# Patient Record
Sex: Female | Born: 1990 | Race: Black or African American | Hispanic: No | Marital: Single | Smoking: Current every day smoker
Health system: Southern US, Community
[De-identification: ages and names within clinical notes are randomized; demographics above are authoritative.]

## PROBLEM LIST (undated history)

## (undated) ENCOUNTER — Inpatient Hospital Stay (HOSPITAL_COMMUNITY): Payer: Self-pay

## (undated) DIAGNOSIS — R569 Unspecified convulsions: Secondary | ICD-10-CM

## (undated) HISTORY — PX: NO PAST SURGERIES: SHX2092

## (undated) HISTORY — PX: WISDOM TOOTH EXTRACTION: SHX21

---
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
## 2010-02-04 ENCOUNTER — Emergency Department (HOSPITAL_COMMUNITY)
Admission: EM | Admit: 2010-02-04 | Discharge: 2010-02-04 | Payer: Self-pay | Source: Home / Self Care | Admitting: Family Medicine

## 2010-02-06 LAB — CBC
HCT: 37.4 % (ref 36.0–46.0)
Hemoglobin: 11.8 g/dL — ABNORMAL LOW (ref 12.0–15.0)
MCH: 24.7 pg — ABNORMAL LOW (ref 26.0–34.0)
MCHC: 31.6 g/dL (ref 30.0–36.0)
MCV: 78.4 fL (ref 78.0–100.0)
Platelets: 229 10*3/uL (ref 150–400)
RBC: 4.77 MIL/uL (ref 3.87–5.11)
RDW: 14.3 % (ref 11.5–15.5)
WBC: 7 10*3/uL (ref 4.0–10.5)

## 2010-02-06 LAB — POCT URINALYSIS DIPSTICK
Bilirubin Urine: NEGATIVE
Hgb urine dipstick: NEGATIVE
Ketones, ur: NEGATIVE mg/dL
Nitrite: NEGATIVE
Protein, ur: 30 mg/dL — AB
Specific Gravity, Urine: 1.02 (ref 1.005–1.030)
Urine Glucose, Fasting: NEGATIVE mg/dL
Urobilinogen, UA: 2 mg/dL — ABNORMAL HIGH (ref 0.0–1.0)
pH: 8.5 — ABNORMAL HIGH (ref 5.0–8.0)

## 2010-02-06 LAB — POCT I-STAT, CHEM 8
BUN: 8 mg/dL (ref 6–23)
Calcium, Ion: 1.19 mmol/L (ref 1.12–1.32)
Chloride: 104 mEq/L (ref 96–112)
Creatinine, Ser: 1.1 mg/dL (ref 0.4–1.2)
Glucose, Bld: 109 mg/dL — ABNORMAL HIGH (ref 70–99)
HCT: 42 % (ref 36.0–46.0)
Hemoglobin: 14.3 g/dL (ref 12.0–15.0)
Potassium: 3.7 mEq/L (ref 3.5–5.1)
Sodium: 138 mEq/L (ref 135–145)
TCO2: 24 mmol/L (ref 0–100)

## 2010-02-06 LAB — POCT RAPID STREP A (OFFICE): Streptococcus, Group A Screen (Direct): NEGATIVE

## 2010-04-28 LAB — POCT URINALYSIS DIP (DEVICE)
Bilirubin Urine: NEGATIVE
Glucose, UA: NEGATIVE mg/dL
Hgb urine dipstick: NEGATIVE
Nitrite: NEGATIVE
Protein, ur: 100 mg/dL — AB
Specific Gravity, Urine: 1.025 (ref 1.005–1.030)
Urobilinogen, UA: 2 mg/dL — ABNORMAL HIGH (ref 0.0–1.0)
pH: 6.5 (ref 5.0–8.0)

## 2010-04-28 LAB — WET PREP, GENITAL
Trich, Wet Prep: NONE SEEN
WBC, Wet Prep HPF POC: NONE SEEN
Yeast Wet Prep HPF POC: NONE SEEN

## 2010-04-28 LAB — GC/CHLAMYDIA PROBE AMP, GENITAL
Chlamydia, DNA Probe: NEGATIVE
GC Probe Amp, Genital: NEGATIVE

## 2010-04-28 LAB — POCT PREGNANCY, URINE: Preg Test, Ur: NEGATIVE

## 2010-05-09 ENCOUNTER — Emergency Department (HOSPITAL_COMMUNITY)
Admission: EM | Admit: 2010-05-09 | Discharge: 2010-05-09 | Disposition: A | Discharge status: Home / Self Care | Payer: Medicaid Other | Attending: Emergency Medicine | Admitting: Emergency Medicine

## 2010-05-09 ENCOUNTER — Emergency Department (HOSPITAL_COMMUNITY): Payer: Medicaid Other

## 2010-05-09 DIAGNOSIS — N898 Other specified noninflammatory disorders of vagina: Secondary | ICD-10-CM | POA: Insufficient documentation

## 2010-05-09 DIAGNOSIS — R109 Unspecified abdominal pain: Secondary | ICD-10-CM | POA: Insufficient documentation

## 2010-05-09 DIAGNOSIS — F17200 Nicotine dependence, unspecified, uncomplicated: Secondary | ICD-10-CM | POA: Insufficient documentation

## 2010-05-09 LAB — DIFFERENTIAL
Basophils Absolute: 0 10*3/uL (ref 0.0–0.1)
Basophils Relative: 0 % (ref 0–1)
Eosinophils Absolute: 0.1 10*3/uL (ref 0.0–0.7)
Eosinophils Relative: 2 % (ref 0–5)
Lymphocytes Relative: 43 % (ref 12–46)
Lymphs Abs: 1.9 10*3/uL (ref 0.7–4.0)
Monocytes Absolute: 0.4 10*3/uL (ref 0.1–1.0)
Monocytes Relative: 9 % (ref 3–12)
Neutro Abs: 2 10*3/uL (ref 1.7–7.7)
Neutrophils Relative %: 46 % (ref 43–77)

## 2010-05-09 LAB — WET PREP, GENITAL
Clue Cells Wet Prep HPF POC: NONE SEEN
Trich, Wet Prep: NONE SEEN
Yeast Wet Prep HPF POC: NONE SEEN

## 2010-05-09 LAB — COMPREHENSIVE METABOLIC PANEL
ALT: 14 U/L (ref 0–35)
AST: 23 U/L (ref 0–37)
Albumin: 4.1 g/dL (ref 3.5–5.2)
Alkaline Phosphatase: 81 U/L (ref 39–117)
BUN: 6 mg/dL (ref 6–23)
CO2: 24 mEq/L (ref 19–32)
Calcium: 9.3 mg/dL (ref 8.4–10.5)
Chloride: 103 mEq/L (ref 96–112)
Creatinine, Ser: 0.78 mg/dL (ref 0.4–1.2)
GFR calc Af Amer: >60 mL/min (ref >60)
GFR calc non Af Amer: >60 mL/min (ref >60)
Glucose, Bld: 84 mg/dL (ref 70–99)
Potassium: 3.3 mEq/L — ABNORMAL LOW (ref 3.5–5.1)
Sodium: 134 mEq/L — ABNORMAL LOW (ref 135–145)
Total Bilirubin: 0.5 mg/dL (ref 0.3–1.2)
Total Protein: 7.8 g/dL (ref 6.0–8.3)

## 2010-05-09 LAB — CBC
HCT: 38.9 % (ref 36.0–46.0)
Hemoglobin: 12.7 g/dL (ref 12.0–15.0)
MCH: 25.8 pg — ABNORMAL LOW (ref 26.0–34.0)
MCHC: 32.6 g/dL (ref 30.0–36.0)
MCV: 78.9 fL (ref 78.0–100.0)
Platelets: 225 10*3/uL (ref 150–400)
RBC: 4.93 MIL/uL (ref 3.87–5.11)
RDW: 14.1 % (ref 11.5–15.5)
WBC: 4.5 10*3/uL (ref 4.0–10.5)

## 2010-05-09 LAB — URINALYSIS, ROUTINE W REFLEX MICROSCOPIC
Bilirubin Urine: NEGATIVE
Glucose, UA: NEGATIVE mg/dL
Hgb urine dipstick: NEGATIVE
Ketones, ur: NEGATIVE mg/dL
Leukocytes, UA: NEGATIVE
Nitrite: NEGATIVE
Protein, ur: 100 mg/dL — AB
Specific Gravity, Urine: 1.023 (ref 1.005–1.030)
Urobilinogen, UA: 0.2 mg/dL (ref 0.0–1.0)
pH: 8 (ref 5.0–8.0)

## 2010-05-09 LAB — LIPASE, BLOOD: Lipase: 30 U/L (ref 11–59)

## 2010-05-09 LAB — POCT PREGNANCY, URINE: Preg Test, Ur: NEGATIVE

## 2010-05-09 LAB — URINE MICROSCOPIC-ADD ON

## 2010-05-10 LAB — GC/CHLAMYDIA PROBE AMP, GENITAL
Chlamydia, DNA Probe: NEGATIVE
GC Probe Amp, Genital: NEGATIVE

## 2010-07-14 ENCOUNTER — Emergency Department (HOSPITAL_COMMUNITY)
Admission: EM | Admit: 2010-07-14 | Discharge: 2010-07-14 | Disposition: A | Discharge status: Home / Self Care | Payer: Medicaid Other | Attending: Emergency Medicine | Admitting: Emergency Medicine

## 2010-07-14 DIAGNOSIS — R000 Tachycardia, unspecified: Secondary | ICD-10-CM | POA: Insufficient documentation

## 2010-07-14 DIAGNOSIS — N898 Other specified noninflammatory disorders of vagina: Secondary | ICD-10-CM | POA: Insufficient documentation

## 2010-07-14 DIAGNOSIS — M549 Dorsalgia, unspecified: Secondary | ICD-10-CM | POA: Insufficient documentation

## 2010-07-14 DIAGNOSIS — R109 Unspecified abdominal pain: Secondary | ICD-10-CM | POA: Insufficient documentation

## 2010-07-14 LAB — POCT PREGNANCY, URINE: Preg Test, Ur: NEGATIVE

## 2010-07-22 ENCOUNTER — Emergency Department (HOSPITAL_COMMUNITY)
Admission: EM | Admit: 2010-07-22 | Discharge: 2010-07-22 | Disposition: A | Discharge status: Home / Self Care | Payer: Medicaid Other | Attending: Emergency Medicine | Admitting: Emergency Medicine

## 2010-07-22 ENCOUNTER — Emergency Department (HOSPITAL_COMMUNITY): Payer: Medicaid Other

## 2010-07-22 DIAGNOSIS — E876 Hypokalemia: Secondary | ICD-10-CM | POA: Insufficient documentation

## 2010-07-22 DIAGNOSIS — R1011 Right upper quadrant pain: Secondary | ICD-10-CM | POA: Insufficient documentation

## 2010-07-22 DIAGNOSIS — R1110 Vomiting, unspecified: Secondary | ICD-10-CM | POA: Insufficient documentation

## 2010-07-22 DIAGNOSIS — D649 Anemia, unspecified: Secondary | ICD-10-CM | POA: Insufficient documentation

## 2010-07-22 DIAGNOSIS — G40909 Epilepsy, unspecified, not intractable, without status epilepticus: Secondary | ICD-10-CM | POA: Insufficient documentation

## 2010-07-22 LAB — DIFFERENTIAL
Basophils Absolute: 0 10*3/uL (ref 0.0–0.1)
Basophils Relative: 0 % (ref 0–1)
Eosinophils Absolute: 0.2 10*3/uL (ref 0.0–0.7)
Eosinophils Relative: 4 % (ref 0–5)
Lymphocytes Relative: 22 % (ref 12–46)
Lymphs Abs: 1.1 10*3/uL (ref 0.7–4.0)
Monocytes Absolute: 0.6 10*3/uL (ref 0.1–1.0)
Monocytes Relative: 12 % (ref 3–12)
Neutro Abs: 3.2 10*3/uL (ref 1.7–7.7)
Neutrophils Relative %: 63 % (ref 43–77)

## 2010-07-22 LAB — CBC
HCT: 30.6 % — ABNORMAL LOW (ref 36.0–46.0)
Hemoglobin: 9.6 g/dL — ABNORMAL LOW (ref 12.0–15.0)
MCH: 24.2 pg — ABNORMAL LOW (ref 26.0–34.0)
MCHC: 31.4 g/dL (ref 30.0–36.0)
MCV: 77.3 fL — ABNORMAL LOW (ref 78.0–100.0)
Platelets: 478 10*3/uL — ABNORMAL HIGH (ref 150–400)
RBC: 3.96 MIL/uL (ref 3.87–5.11)
RDW: 13.4 % (ref 11.5–15.5)
WBC: 5.1 10*3/uL (ref 4.0–10.5)

## 2010-07-22 LAB — COMPREHENSIVE METABOLIC PANEL
ALT: 8 U/L (ref 0–35)
AST: 16 U/L (ref 0–37)
Albumin: 3.6 g/dL (ref 3.5–5.2)
Alkaline Phosphatase: 94 U/L (ref 39–117)
BUN: 14 mg/dL (ref 6–23)
CO2: 28 mEq/L (ref 19–32)
Calcium: 9.8 mg/dL (ref 8.4–10.5)
Chloride: 99 mEq/L (ref 96–112)
Creatinine, Ser: 0.78 mg/dL (ref 0.50–1.10)
GFR calc Af Amer: >60 mL/min (ref >60)
GFR calc non Af Amer: >60 mL/min (ref >60)
Glucose, Bld: 85 mg/dL (ref 70–99)
Potassium: 3.4 mEq/L — ABNORMAL LOW (ref 3.5–5.1)
Sodium: 138 mEq/L (ref 135–145)
Total Bilirubin: 0.2 mg/dL — ABNORMAL LOW (ref 0.3–1.2)
Total Protein: 9.1 g/dL — ABNORMAL HIGH (ref 6.0–8.3)

## 2010-07-22 LAB — POCT PREGNANCY, URINE: Preg Test, Ur: NEGATIVE

## 2010-07-22 LAB — URINALYSIS, ROUTINE W REFLEX MICROSCOPIC
Ketones, ur: 15 mg/dL — AB
Leukocytes, UA: NEGATIVE
Nitrite: NEGATIVE
Protein, ur: 100 mg/dL — AB
Urobilinogen, UA: 1 mg/dL (ref 0.0–1.0)
pH: 6 (ref 5.0–8.0)

## 2010-07-22 LAB — LIPASE, BLOOD: Lipase: 20 U/L (ref 11–59)

## 2010-07-22 LAB — URINE MICROSCOPIC-ADD ON

## 2010-07-24 LAB — OCCULT BLOOD, POC DEVICE: Fecal Occult Bld: NEGATIVE

## 2010-10-14 ENCOUNTER — Emergency Department (HOSPITAL_COMMUNITY)
Admission: EM | Admit: 2010-10-14 | Discharge: 2010-10-14 | Disposition: A | Discharge status: Home / Self Care | Payer: Medicaid Other | Attending: Emergency Medicine | Admitting: Emergency Medicine

## 2010-10-14 DIAGNOSIS — N390 Urinary tract infection, site not specified: Secondary | ICD-10-CM | POA: Insufficient documentation

## 2010-10-14 DIAGNOSIS — F17200 Nicotine dependence, unspecified, uncomplicated: Secondary | ICD-10-CM | POA: Insufficient documentation

## 2010-10-14 DIAGNOSIS — N760 Acute vaginitis: Secondary | ICD-10-CM | POA: Insufficient documentation

## 2010-10-14 LAB — WET PREP, GENITAL
Trich, Wet Prep: NONE SEEN
Yeast Wet Prep HPF POC: NONE SEEN

## 2010-10-14 LAB — URINE MICROSCOPIC-ADD ON

## 2010-10-14 LAB — URINALYSIS, ROUTINE W REFLEX MICROSCOPIC
Bilirubin Urine: NEGATIVE
Hgb urine dipstick: NEGATIVE
Specific Gravity, Urine: 1.024 (ref 1.005–1.030)
Urobilinogen, UA: 1 mg/dL (ref 0.0–1.0)
pH: 8.5 — ABNORMAL HIGH (ref 5.0–8.0)

## 2010-10-14 LAB — POCT PREGNANCY, URINE: Preg Test, Ur: NEGATIVE

## 2010-10-17 LAB — GC/CHLAMYDIA PROBE AMP, GENITAL
Chlamydia, DNA Probe: POSITIVE — AB
GC Probe Amp, Genital: NEGATIVE

## 2011-02-10 ENCOUNTER — Emergency Department (HOSPITAL_COMMUNITY): Payer: Medicaid Other

## 2011-02-10 ENCOUNTER — Emergency Department (HOSPITAL_COMMUNITY)
Admission: EM | Admit: 2011-02-10 | Discharge: 2011-02-10 | Disposition: A | Discharge status: Home / Self Care | Payer: Medicaid Other | Attending: Emergency Medicine | Admitting: Emergency Medicine

## 2011-02-10 ENCOUNTER — Encounter (HOSPITAL_COMMUNITY): Payer: Self-pay | Admitting: *Deleted

## 2011-02-10 DIAGNOSIS — H5789 Other specified disorders of eye and adnexa: Secondary | ICD-10-CM | POA: Insufficient documentation

## 2011-02-10 DIAGNOSIS — H11429 Conjunctival edema, unspecified eye: Secondary | ICD-10-CM | POA: Insufficient documentation

## 2011-02-10 DIAGNOSIS — H5710 Ocular pain, unspecified eye: Secondary | ICD-10-CM | POA: Insufficient documentation

## 2011-02-10 DIAGNOSIS — H11421 Conjunctival edema, right eye: Secondary | ICD-10-CM

## 2011-02-10 DIAGNOSIS — T368X5A Adverse effect of other systemic antibiotics, initial encounter: Secondary | ICD-10-CM | POA: Insufficient documentation

## 2011-02-10 DIAGNOSIS — H538 Other visual disturbances: Secondary | ICD-10-CM | POA: Insufficient documentation

## 2011-02-10 DIAGNOSIS — J45909 Unspecified asthma, uncomplicated: Secondary | ICD-10-CM | POA: Insufficient documentation

## 2011-02-10 DIAGNOSIS — H547 Unspecified visual loss: Secondary | ICD-10-CM | POA: Insufficient documentation

## 2011-02-10 DIAGNOSIS — H11419 Vascular abnormalities of conjunctiva, unspecified eye: Secondary | ICD-10-CM | POA: Insufficient documentation

## 2011-02-10 DIAGNOSIS — T7840XA Allergy, unspecified, initial encounter: Secondary | ICD-10-CM

## 2011-02-10 LAB — DIFFERENTIAL
Band Neutrophils: 0 % (ref 0–10)
Basophils Absolute: 0 10*3/uL (ref 0.0–0.1)
Basophils Relative: 0 % (ref 0–1)
Lymphocytes Relative: 65 % — ABNORMAL HIGH (ref 12–46)
Lymphs Abs: 2.5 10*3/uL (ref 0.7–4.0)
Monocytes Absolute: 0.4 10*3/uL (ref 0.1–1.0)
Monocytes Relative: 11 % (ref 3–12)
Neutro Abs: 0.8 10*3/uL — ABNORMAL LOW (ref 1.7–7.7)
Neutrophils Relative %: 22 % — ABNORMAL LOW (ref 43–77)

## 2011-02-10 LAB — CBC
HCT: 36.9 % (ref 36.0–46.0)
Hemoglobin: 11.9 g/dL — ABNORMAL LOW (ref 12.0–15.0)
RBC: 4.65 MIL/uL (ref 3.87–5.11)
WBC: 3.8 10*3/uL — ABNORMAL LOW (ref 4.0–10.5)

## 2011-02-10 LAB — POCT I-STAT, CHEM 8
BUN: 4 mg/dL — ABNORMAL LOW (ref 6–23)
Calcium, Ion: 1.29 mmol/L (ref 1.12–1.32)
Chloride: 104 mEq/L (ref 96–112)
Glucose, Bld: 80 mg/dL (ref 70–99)
HCT: 40 % (ref 36.0–46.0)
TCO2: 26 mmol/L (ref 0–100)

## 2011-02-10 MED ORDER — TETRACAINE HCL 0.5 % OP SOLN: 1.0000 [drp] | Freq: Once | OPHTHALMIC | Status: DC

## 2011-02-10 MED ORDER — FAMOTIDINE 20 MG PO TABS
20.0000 mg | ORAL_TABLET | Freq: Once | ORAL | Status: AC
  Administered 2011-02-10: 20 mg via ORAL
  Filled 2011-02-10: qty 1

## 2011-02-10 MED ORDER — SULFACETAMIDE-PREDNISOLONE 10-0.23 % OP SOLN: 2.0000 [drp] | Freq: Four times a day (QID) | OPHTHALMIC | Status: AC

## 2011-02-10 MED ORDER — SULFACETAMIDE-PREDNISOLONE 10-0.23 % OP SOLN: 2.0000 [drp] | Freq: Four times a day (QID) | OPHTHALMIC | Status: DC

## 2011-02-10 MED ORDER — ERYTHROMYCIN 5 MG/GM OP OINT: TOPICAL_OINTMENT | Freq: Every day | OPHTHALMIC | Status: AC

## 2011-02-10 MED ORDER — FLUORESCEIN SODIUM 1 MG OP STRP: 1.0000 | ORAL_STRIP | Freq: Once | OPHTHALMIC | Status: DC

## 2011-02-10 NOTE — ED Notes (Signed)
Called Pharmacy to see if the Vasocidin was available, and they stated it was not. Informed EDP Tomasa Blase. Will continue to monitor.

## 2011-02-10 NOTE — ED Notes (Signed)
Lab sending pts blood now. CT stated they will be getting pt in a few minutes. Notified pt about plans. Will continue to monitor.

## 2011-02-10 NOTE — ED Notes (Signed)
Eye drops and strip at bedside. EDP made aware.

## 2011-02-10 NOTE — ED Notes (Signed)
Pt stated that she has had "pink eye" since Thursday. She went to the ED in [place] and they gave her medicated eye drops. Per pt, she has had increased drainage, itching, and redness. Currently, not in any pain or respiratory distress. Will continue to monitor.

## 2011-02-10 NOTE — ED Provider Notes (Signed)
History     CSN: [id number]  Arrival date & time 02/10/11  1755   First MD Initiated Contact with Patient 02/10/11 2020      Chief Complaint  Patient presents with  . Eye Problem    (Consider location/radiation/quality/duration/timing/severity/associated sxs/prior treatment) HPI Comments: Patient initially noticed some crusting to the left inner canthus for 5 days ago.  This spontaneously resolved.  4 days ago.  She noticed the same in her right eye, but has progressively gotten red, painful.  She was seen at [facility] and diagnosed with conjunctivitis given gentamicin eyedrops, which she has been using as prescribed.  The redness has become worse.  Drainage has increased.  She now has.  Orbital edema and tenderness and a paleness to her right cheek.  She denies headache, nausea, vomiting, has slight pain with lateral and upward gaze of the eye.  She does report extreme blurriness in the right eye, she is supposed to wear glasses, but has lost them and does not have her current ophthalmologist.  Denies recent URI symptoms, nasal congestion, sinusitis  Patient is a 21 y.o. female presenting with eye problem. The history is provided by the patient.  Eye Problem  This is a new problem. The current episode started more than 2 days ago. The problem occurs constantly. The problem has been gradually worsening. There is pain in the right eye. There was no injury mechanism. The pain is at a severity of 7/10. The pain is moderate. There is no history of trauma to the eye. Associated symptoms include decreased vision, discharge and eye redness. Pertinent negatives include no nausea.    Past Medical History  Diagnosis Date  . Asthma     History reviewed. No pertinent past surgical history.  History reviewed. No pertinent family history.  History  Substance Use Topics  . Smoking status: Current Everyday Smoker  . Smokeless tobacco: Not on file  . Alcohol Use: No    OB  History    Grav Para Term Preterm Abortions TAB SAB Ect Mult Living                  Review of Systems  Constitutional: Negative for fever and chills.  HENT: Negative for rhinorrhea and sinus pressure.   Eyes: Positive for discharge and redness.  Gastrointestinal: Negative for nausea.  Musculoskeletal: Negative for myalgias.  Skin: Positive for color change.  Neurological: Negative for dizziness and headaches.    Allergies  Review of patient's allergies indicates no known allergies.  Home Medications   Current Outpatient Rx  Name Route Sig Dispense Refill  . ERYTHROMYCIN 5 MG/GM OP OINT Right Eye Place into the right eye at bedtime. 3.5 g 0  . SULFACETAMIDE-PREDNISOLONE 10-0.23 % OP SOLN Right Eye Place 2 drops into the right eye QID. 5 mL 0    BP 105/69  Pulse 84  Temp(Src) 98.2 F (36.8 C) (Oral)  Resp 16  SpO2 100%  LMP 01/10/2011  Physical Exam  Constitutional: She is oriented to person, place, and time. She appears well-developed and well-nourished.  HENT:  Head: Normocephalic.         Area of paleness  Eyes: Right eye exhibits discharge and exudate. Right conjunctiva is injected. Right eye exhibits abnormal extraocular motion. Right eye exhibits no nystagmus. Right pupil is round and reactive.         There is no fluorescein stain uptake tender around the eye, discoloration around the eye palenees of cheek  Neck: Normal range of motion.  Cardiovascular: Normal rate.   Pulmonary/Chest: Effort normal.  Musculoskeletal: Normal range of motion.  Neurological: She is alert and oriented to person, place, and time.  Skin: Skin is warm and dry. There is erythema.       As described around the eye     ED Course  Procedures (including critical care time)  Labs Reviewed  CBC - Abnormal; Notable for the following:    WBC 3.8 (*)    Hemoglobin 11.9 (*)    MCH 25.6 (*)    All other components within normal limits  DIFFERENTIAL - Abnormal; Notable for the  following:    Neutrophils Relative 22 (*)    Lymphocytes Relative 65 (*)    Neutro Abs 0.8 (*)    All other components within normal limits  POCT I-STAT, CHEM 8 - Abnormal; Notable for the following:    Potassium 3.1 (*)    BUN 4 (*)    All other components within normal limits  I-STAT, CHEM 8   Ct Orbitss W/o Cm  02/10/2011  *RADIOLOGY REPORT*  Clinical Data: Periorbital cellulitis  CT ORBITS WITHOUT CONTRAST  Technique:  Multidetector CT imaging of the orbits was performed following the standard protocol without intravenous contrast.  Comparison: None.  Findings: Extensive sinusitis.  Marked circumferential mucosal thickening of both maxillary sinuses.  Extensive mucosal thickening and fluid in the ethmoid air cells bilaterally.  Near complete opacification of the left sphenoid sinus and focal mucosal thickening of the right sphenoid sinus.  The frontal sinuses are clear.  Imaged portions of the mastoid air cells are clear.  The globes and lenses appear normal bilaterally.  The retro-orbital fat planes are normal and symmetric.  No significant facial swelling is appreciated.  No acute bony abnormality.  IMPRESSION:  1.  Extensive chronic sinusitis involving the bilateral maxillary, ethmoid, and sphenoid sinuses. An element of superimposed acute sinusitis is difficult to exclude. 2.  Normal appearance of the orbits.  Original Report Authenticated By: Britta Mccreedy, M.D.     1. Allergic reaction caused by a drug   2. Conjunctival edema of right eye     Concern for worsening redness, decreased vision.  Will obtain labs, CT scan to rule out periorbital cellulitis, abscess.  There is no fluorescein stain uptake of the eye to indicate abrasion or ulcer.  After this is done will contact ophthalmology Discussed Ms. Noblett's case with Dr. Karleen Hampshire and we agree that this is an allergic reaction to the gentamicin.  We'll switch patient to assess.  I again drops 4 times a day cold compresses with  erythromyc for the next 7 days ointment at night .  She can followup with Dr. Karleen Hampshire next week if not improving Allergic reaction to the gentamicin eyedropsMDM          Arman Filter, NP 02/10/11 2310

## 2011-02-10 NOTE — ED Notes (Signed)
The pt has had rt eye irritation since  Thursday.  She initially had both eyes red and itching now only the rt eye is red.  She was seen at [place] [facility] thursday

## 2011-02-21 NOTE — ED Provider Notes (Signed)
Evaluation and management procedures were performed by the PA/NP under my supervision/collaboration.    Zoey Gilkeson D Megann Easterwood, MD 02/21/11 1909 

## 2011-02-25 ENCOUNTER — Emergency Department (INDEPENDENT_AMBULATORY_CARE_PROVIDER_SITE_OTHER)
Admission: EM | Admit: 2011-02-25 | Discharge: 2011-02-25 | Disposition: A | Discharge status: Home / Self Care | Payer: Medicaid Other | Source: Home / Self Care

## 2011-02-25 ENCOUNTER — Emergency Department (INDEPENDENT_AMBULATORY_CARE_PROVIDER_SITE_OTHER): Payer: Medicaid Other

## 2011-02-25 ENCOUNTER — Encounter (HOSPITAL_COMMUNITY): Payer: Self-pay

## 2011-02-25 DIAGNOSIS — R109 Unspecified abdominal pain: Secondary | ICD-10-CM

## 2011-02-25 LAB — POCT URINALYSIS DIP (DEVICE)
Glucose, UA: 100 mg/dL — AB
Hgb urine dipstick: NEGATIVE
Nitrite: NEGATIVE
Protein, ur: 100 mg/dL — AB
Specific Gravity, Urine: >=1.03 (ref 1.005–1.030)
Urobilinogen, UA: 1 mg/dL (ref 0.0–1.0)

## 2011-02-25 LAB — WET PREP, GENITAL: Yeast Wet Prep HPF POC: NONE SEEN

## 2011-02-25 NOTE — ED Notes (Signed)
Pt has abd pain and wants to have a pregnancy test.

## 2011-02-25 NOTE — ED Provider Notes (Signed)
History     CSN: [id number]  Arrival date & time 02/25/11  1241   None     Chief Complaint  Patient presents with  . Abdominal Pain    (Consider location/radiation/quality/duration/timing/severity/associated sxs/prior treatment) HPI Comments: Pt presents requesting to have a pregnancy test. She states she did a home pregnancy test and thinks that it is positive. Her LMP was 02-22-11 but she is concerned that she may be pregnant because she is having intermittent pain "in my sides" for the last 3 weeks. She points to bilat flank areas. She states that the pain comes and goes during the day, varies from side to side, and is sharp. It last for a few minutes only when occurs and does not radiate. She has had no fever, N/V/D/C. BMs have been every other day, and this is normal for her. She denies dysuria, urinary frequency or vaginal discharge. She slipped and fell 3 weeks ago and thinks that the pain may be from this.     Past Medical History  Diagnosis Date  . Asthma     History reviewed. No pertinent past surgical history.  History reviewed. No pertinent family history.  History  Substance Use Topics  . Smoking status: Current Everyday Smoker  . Smokeless tobacco: Not on file  . Alcohol Use: No    OB History    Grav Para Term Preterm Abortions TAB SAB Ect Mult Living                  Review of Systems  Constitutional: Negative for fever and chills.  Respiratory: Negative for cough and shortness of breath.   Cardiovascular: Negative for chest pain.  Gastrointestinal: Negative for nausea, vomiting, diarrhea and constipation.  Genitourinary: Positive for flank pain. Negative for dysuria, frequency, decreased urine volume, vaginal bleeding, vaginal discharge, vaginal pain, menstrual problem and pelvic pain.  Musculoskeletal: Negative for back pain.  Skin: Negative for color change and rash.    Allergies  Review of patient's allergies indicates no known allergies.  Home  Medications  No current outpatient prescriptions on file.  BP 112/59  Pulse 79  Temp(Src) 98 F (36.7 C) (Oral)  Resp 14  SpO2 100%  LMP 02/13/2011  Physical Exam  Nursing note and vitals reviewed. Constitutional: She appears well-developed and well-nourished. No distress.  HENT:  Head: Normocephalic and atraumatic.  Cardiovascular: Normal rate, regular rhythm and normal heart sounds.   Pulmonary/Chest: Effort normal and breath sounds normal. No respiratory distress.  Abdominal: Soft. Bowel sounds are normal. She exhibits no mass. There is no hepatosplenomegaly. There is no tenderness. There is no CVA tenderness.  Musculoskeletal:       Thoracic back: She exhibits no tenderness.       Lumbar back: She exhibits no tenderness.  Neurological: Gait normal.  Skin: Skin is warm and dry.  Psychiatric: She has a normal mood and affect.    ED Course  Procedures (including critical care time)  Labs Reviewed  POCT URINALYSIS DIP (DEVICE) - Abnormal; Notable for the following:    Glucose, UA 100 (*)    Bilirubin Urine SMALL (*)    Protein, ur 100 (*)    Leukocytes, UA TRACE (*) Biochemical Testing Only. Please order routine urinalysis from main lab if confirmatory testing is needed.   All other components within normal limits  WET PREP, GENITAL - Abnormal; Notable for the following:    Clue Cells Wet Prep HPF POC FEW (*)    WBC, Wet Prep HPF  POC MODERATE (*)    All other components within normal limits  POCT PREGNANCY, URINE  GC/CHLAMYDIA PROBE AMP, GENITAL   Dg Abd 1 View  02/25/2011  *RADIOLOGY REPORT*  Clinical Data: Right flank pain for 3 weeks.  ABDOMEN - 1 VIEW  Comparison: None.  Findings: Nonobstructive bowel gas pattern.  Prominent proximal stomach contents are present, suggesting recent meal.  No calcifications over the renal shadows or the course of the ureters bilaterally.  The bones appear within normal limits.  IMPRESSION: No acute abnormality.  Original Report  Authenticated By: Andreas Newport, M.D.     1. Flank pain       MDM  Urine preg neg. Pts preg test that she brought from home is also neg. KUB reviewed by myself and radiologist. Chart reviewed. 05-09-10 pt was seen in ED for similar complaints.         Melody Comas, [place] 02/25/11 1526

## 2011-02-27 LAB — GC/CHLAMYDIA PROBE AMP, GENITAL
Chlamydia, DNA Probe: POSITIVE — AB
GC Probe Amp, Genital: POSITIVE — AB

## 2011-03-01 ENCOUNTER — Telehealth (HOSPITAL_COMMUNITY): Payer: Self-pay | Admitting: *Deleted

## 2011-03-01 MED ORDER — AZITHROMYCIN 250 MG PO TABS: 1000.0000 mg | ORAL_TABLET | Freq: Every day | ORAL | Status: DC

## 2011-03-01 MED ORDER — CEFTRIAXONE SODIUM 250 MG IJ SOLR: 250.0000 mg | Freq: Once | INTRAMUSCULAR | Status: DC

## 2011-03-01 NOTE — ED Notes (Signed)
GC pos., Chlamydia pos., Wet prep: Few clue cells, mod. WBC's.  Order obtained from [facility] for Rocephin 250 mg. IM and Zithromax. I called and left message for pt. to call. Vassie Moselle 03/01/2011

## 2011-03-02 ENCOUNTER — Telehealth (HOSPITAL_COMMUNITY): Payer: Self-pay | Admitting: *Deleted

## 2011-03-02 NOTE — ED Notes (Signed)
I called and left message for pt. to call.  Call 2. [name] 03/02/2011

## 2011-03-02 NOTE — ED Notes (Signed)
Pt. left message on VM. I called pt. back and could not get her on the phone. I had to leave another message to call [phone] if she can't get me by 1800.[name] 03/02/2011

## 2011-03-05 ENCOUNTER — Telehealth (HOSPITAL_COMMUNITY): Payer: Self-pay | Admitting: *Deleted

## 2011-03-05 NOTE — ED Notes (Signed)
Pt. verified x 2 and given lab results. Pt. told she needs to come back for a shot of Rocephin and 4 pills of Zithromax. Pt. said she was coming back tonight. I told her to let registration know she was just here for a shot and medication. Pt. instructed to notify her partner, no sex for 1 week and to practice safe sex. Pt. voiced understanding. [name] 03/05/2011

## 2011-03-05 NOTE — ED Provider Notes (Signed)
Medical screening examination/treatment/procedure(s) were performed by non-physician practitioner and as supervising physician I was immediately available for consultation/collaboration.  Corrie Mckusick, MD 03/05/11 [phone]

## 2011-03-06 ENCOUNTER — Emergency Department (INDEPENDENT_AMBULATORY_CARE_PROVIDER_SITE_OTHER)
Admission: EM | Admit: 2011-03-06 | Discharge: 2011-03-06 | Disposition: A | Discharge status: Home / Self Care | Payer: Medicaid Other | Source: Home / Self Care

## 2011-03-06 ENCOUNTER — Encounter (HOSPITAL_COMMUNITY): Payer: Self-pay | Admitting: *Deleted

## 2011-03-06 DIAGNOSIS — Z202 Contact with and (suspected) exposure to infections with a predominantly sexual mode of transmission: Secondary | ICD-10-CM

## 2011-03-06 MED ORDER — CEFTRIAXONE SODIUM 250 MG IJ SOLR
250.0000 mg | Freq: Once | INTRAMUSCULAR | Status: AC
  Administered 2011-03-06: 250 mg via INTRAMUSCULAR

## 2011-03-06 MED ORDER — AZITHROMYCIN 250 MG PO TABS
1000.0000 mg | ORAL_TABLET | Freq: Once | ORAL | Status: AC
  Administered 2011-03-06: 1000 mg via ORAL

## 2011-03-06 MED ORDER — CEFTRIAXONE SODIUM 250 MG IJ SOLR
INTRAMUSCULAR | Status: AC
  Filled 2011-03-06: qty 250

## 2011-03-06 MED ORDER — LIDOCAINE HCL (PF) 1 % IJ SOLN
INTRAMUSCULAR | Status: AC
  Filled 2011-03-06: qty 5

## 2011-03-06 MED ORDER — AZITHROMYCIN 250 MG PO TABS
ORAL_TABLET | ORAL | Status: AC
  Filled 2011-03-06: qty 4

## 2011-03-06 NOTE — ED Notes (Signed)
Pt. came to [facility] and was treated with Rocephin 250 mg. IM and Zithromax 1 gm. Pt. instructed to notify her partner, no sex for 1 week, and to practice safe sex. Pt.told she can get HIV testing at the [facility].  [facility] forms x 2 faxed to [facility]. Vassie Moselle 03/06/2011

## 2011-03-06 NOTE — ED Notes (Signed)
Pt. Called to return for treatment of GC and Chlamydia with Rocephin and Zithromax.

## 2011-03-06 NOTE — Discharge Instructions (Signed)
Call if any problems.  Notify your partner to be treated, no sex for 1 week and practice safe sex. You can get HIV testing at the [facility].

## 2011-03-06 NOTE — ED Notes (Signed)
Pt. Instructed she has to wait 15 min. after injection to be observed for allergic reaction. Pt. told what symptoms to watch for- rash like hives with itching, swelling in throat and SOB.

## 2011-05-20 ENCOUNTER — Emergency Department (HOSPITAL_COMMUNITY)
Admission: EM | Admit: 2011-05-20 | Discharge: 2011-05-21 | Disposition: A | Discharge status: Home / Self Care | Payer: Medicaid Other | Attending: Emergency Medicine | Admitting: Emergency Medicine

## 2011-05-20 ENCOUNTER — Encounter (HOSPITAL_COMMUNITY): Payer: Self-pay | Admitting: Emergency Medicine

## 2011-05-20 DIAGNOSIS — B373 Candidiasis of vulva and vagina: Secondary | ICD-10-CM

## 2011-05-20 DIAGNOSIS — N72 Inflammatory disease of cervix uteri: Secondary | ICD-10-CM | POA: Insufficient documentation

## 2011-05-20 DIAGNOSIS — B3731 Acute candidiasis of vulva and vagina: Secondary | ICD-10-CM | POA: Insufficient documentation

## 2011-05-20 HISTORY — DX: Unspecified convulsions: R56.9

## 2011-05-20 LAB — POCT PREGNANCY, URINE: Preg Test, Ur: NEGATIVE

## 2011-05-20 LAB — URINALYSIS, ROUTINE W REFLEX MICROSCOPIC
Nitrite: NEGATIVE
Specific Gravity, Urine: 1.021 (ref 1.005–1.030)
Urobilinogen, UA: 0.2 mg/dL (ref 0.0–1.0)
pH: 7.5 (ref 5.0–8.0)

## 2011-05-20 LAB — URINE MICROSCOPIC-ADD ON

## 2011-05-20 NOTE — ED Notes (Addendum)
Patient states she has vaginal discharge, grey in color, some bleeding with it.  Patient states it started tonight, about 10 mins ago.  Patient states she could possibly be pregnant.

## 2011-05-21 LAB — WET PREP, GENITAL: Trich, Wet Prep: NONE SEEN

## 2011-05-21 MED ORDER — CEFTRIAXONE SODIUM 250 MG IJ SOLR
250.0000 mg | Freq: Once | INTRAMUSCULAR | Status: AC
  Administered 2011-05-21: 250 mg via INTRAMUSCULAR
  Filled 2011-05-21: qty 250

## 2011-05-21 MED ORDER — FLUCONAZOLE 50 MG PO TABS
150.0000 mg | ORAL_TABLET | Freq: Once | ORAL | Status: AC
  Administered 2011-05-21: 150 mg via ORAL
  Filled 2011-05-21: qty 3

## 2011-05-21 MED ORDER — LIDOCAINE HCL (PF) 1 % IJ SOLN
INTRAMUSCULAR | Status: AC
  Administered 2011-05-21: 2.1 mL
  Filled 2011-05-21: qty 5

## 2011-05-21 MED ORDER — AZITHROMYCIN 250 MG PO TABS
1000.0000 mg | ORAL_TABLET | Freq: Once | ORAL | Status: AC
  Administered 2011-05-21: 1000 mg via ORAL
  Filled 2011-05-21: qty 4

## 2011-05-21 NOTE — ED Provider Notes (Signed)
Medical screening examination/treatment/procedure(s) were performed by non-physician practitioner and as supervising physician I was immediately available for consultation/collaboration.   Vida Roller, MD 05/21/11 [phone]

## 2011-05-21 NOTE — ED Provider Notes (Addendum)
History     CSN: [id number]  Arrival date & time 05/20/11  2313   First MD Initiated Contact with Patient 05/20/11 2343      Chief Complaint  Patient presents with  . Vaginitis    (Consider location/radiation/quality/duration/timing/severity/associated sxs/prior treatment) HPI Comments: Vaginal discgh=harge for 10 minutes prior to arrival also concerned may be pregnant   The history is provided by the patient.    Past Medical History  Diagnosis Date  . Asthma   . Seizures     History reviewed. No pertinent past surgical history.  No family history on file.  History  Substance Use Topics  . Smoking status: Current Everyday Smoker -- 0.2 packs/day    Types: Cigarettes  . Smokeless tobacco: Not on file  . Alcohol Use: No    OB History    Grav Para Term Preterm Abortions TAB SAB Ect Mult Living                  Review of Systems  Genitourinary: Positive for vaginal discharge.    Allergies  Review of patient's allergies indicates no known allergies.  Home Medications  No current outpatient prescriptions on file.  BP 132/76  Temp(Src) 98.1 F (36.7 C) (Oral)  Resp 18  SpO2 100%  LMP 04/15/2011  Physical Exam  Constitutional: She is oriented to person, place, and time. She appears well-developed. She appears distressed.  Eyes: Pupils are equal, round, and reactive to light.  Neck: Normal range of motion.  Cardiovascular: Normal rate.   Pulmonary/Chest: Effort normal.  Abdominal: Soft.  Genitourinary: Vaginal discharge found.  Musculoskeletal: Normal range of motion.  Neurological: She is alert and oriented to person, place, and time.    ED Course  Procedures (including critical care time)  Labs Reviewed  URINALYSIS, ROUTINE W REFLEX MICROSCOPIC - Abnormal; Notable for the following:    Protein, ur 30 (*)    Leukocytes, UA SMALL (*)    All other components within normal limits  URINE MICROSCOPIC-ADD ON - Abnormal; Notable for the following:    Squamous Epithelial / LPF FEW (*)    Bacteria, UA FEW (*)    All other components within normal limits  WET PREP, GENITAL - Abnormal; Notable for the following:    Yeast Wet Prep HPF POC MODERATE (*)    Clue Cells Wet Prep HPF POC FEW (*)    WBC, Wet Prep HPF POC MANY (*)    All other components within normal limits  POCT PREGNANCY, URINE  GC/CHLAMYDIA PROBE AMP, GENITAL   No results found.   1. Vaginal candidiasis   2. Cervicitis       MDM  Vaginal discharge that became worse tonight        Arman Filter, NP 05/21/11 0055  Arman Filter, NP 07/06/11 1601  Arman Filter, NP 09/29/11 2048

## 2011-05-21 NOTE — Discharge Instructions (Signed)
Cervicitis Cervicitis is a soreness and puffiness (inflammation) of the cervix. The cervix is at the bottom of the uterus. Your doctor will do an exam to find the cause. Your treatment will depend on the cause. If the cause is a sexual infection, you and your partner both need treatment. HOME CARE  Do not have sex (intercourse) until your doctor says it is okay.   Do not have sex until your partner is treated if your doctor told you not to.   Take medicines (antibiotics) as told. Finish them even if you start to feel better.  GET HELP RIGHT AWAY IF:   Your problems come back.   You have a fever.   You have problems that may be caused by your medicine.  MAKE SURE YOU:   Understand these instructions.   Will watch your condition.   Will get help right away if you are not doing well or get worse.  Document Released: 10/18/2007 Document Revised: 12/28/2010 Document Reviewed: 08/07/2010 [facility] Patient Information 2012 [place], [place].Candidal Vulvovaginitis Candidal vulvovaginitis is an infection of the vagina and vulva. The vulva is the skin around the opening of the vagina. This may cause itching and discomfort in and around the vagina.  HOME CARE  Only take medicine as told by your doctor.   Do not have sex (intercourse) until the infection is healed or as told by your doctor.   Practice safe sex.   Tell your sex partner about your infection.   Do not douche or use tampons.   Wear cotton underwear. Do not wear tight pants or panty hose.   Eat yogurt. This may help treat and prevent yeast infections.  GET HELP RIGHT AWAY IF:   You have a fever.   Your problems get worse during treatment or do not get better in 3 days.   You have discomfort, irritation, or itching in your vagina or vulva area.   You have pain after sex.   You start to get belly (abdominal) pain.  MAKE SURE YOU:  Understand these instructions.   Will watch your condition.   Will get help right  away if you are not doing well or get worse.  Document Released: 04/06/2008 Document Revised: 12/28/2010 Document Reviewed: 04/06/2008 [facility] Patient Information 2012 [place], [place].

## 2011-05-21 NOTE — ED Notes (Signed)
Pt states that her symptoms started today

## 2011-05-21 NOTE — ED Notes (Signed)
Pt states understanding of discharge instructions 

## 2011-07-09 NOTE — ED Provider Notes (Signed)
Medical screening examination/treatment/procedure(s) were performed by non-physician practitioner and as supervising physician I was immediately available for consultation/collaboration.    Vida Roller, MD 07/09/11 [phone]

## 2011-08-31 ENCOUNTER — Emergency Department (HOSPITAL_COMMUNITY)
Admission: EM | Admit: 2011-08-31 | Discharge: 2011-08-31 | Disposition: A | Discharge status: Home / Self Care | Payer: Medicaid Other | Attending: Emergency Medicine | Admitting: Emergency Medicine

## 2011-08-31 DIAGNOSIS — F17200 Nicotine dependence, unspecified, uncomplicated: Secondary | ICD-10-CM | POA: Insufficient documentation

## 2011-08-31 DIAGNOSIS — IMO0002 Reserved for concepts with insufficient information to code with codable children: Secondary | ICD-10-CM | POA: Insufficient documentation

## 2011-08-31 DIAGNOSIS — S7000XA Contusion of unspecified hip, initial encounter: Secondary | ICD-10-CM | POA: Insufficient documentation

## 2011-08-31 MED ORDER — IBUPROFEN 400 MG PO TABS
600.0000 mg | ORAL_TABLET | Freq: Once | ORAL | Status: AC
  Administered 2011-08-31: 600 mg via ORAL
  Filled 2011-08-31: qty 1

## 2011-08-31 MED ORDER — IBUPROFEN 600 MG PO TABS: 600.0000 mg | ORAL_TABLET | Freq: Three times a day (TID) | ORAL | Status: AC | PRN
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
## 2011-10-01 NOTE — ED Provider Notes (Signed)
Medical screening examination/treatment/procedure(s) were performed by non-physician practitioner and as supervising physician I was immediately available for consultation/collaboration.    Kein Carlberg D Robie Oats, MD 10/01/11 2255 

## 2011-10-12 ENCOUNTER — Encounter (HOSPITAL_COMMUNITY): Payer: Self-pay

## 2011-10-12 ENCOUNTER — Emergency Department (HOSPITAL_COMMUNITY)
Admission: EM | Admit: 2011-10-12 | Discharge: 2011-10-12 | Disposition: A | Discharge status: Home / Self Care | Payer: Medicaid Other | Attending: Emergency Medicine | Admitting: Emergency Medicine

## 2011-10-12 DIAGNOSIS — N760 Acute vaginitis: Secondary | ICD-10-CM | POA: Insufficient documentation

## 2011-10-12 DIAGNOSIS — G40909 Epilepsy, unspecified, not intractable, without status epilepticus: Secondary | ICD-10-CM | POA: Insufficient documentation

## 2011-10-12 DIAGNOSIS — A499 Bacterial infection, unspecified: Secondary | ICD-10-CM | POA: Insufficient documentation

## 2011-10-12 DIAGNOSIS — J45909 Unspecified asthma, uncomplicated: Secondary | ICD-10-CM | POA: Insufficient documentation

## 2011-10-12 DIAGNOSIS — B9689 Other specified bacterial agents as the cause of diseases classified elsewhere: Secondary | ICD-10-CM | POA: Insufficient documentation

## 2011-10-12 DIAGNOSIS — F17200 Nicotine dependence, unspecified, uncomplicated: Secondary | ICD-10-CM | POA: Insufficient documentation

## 2011-10-12 LAB — URINALYSIS, ROUTINE W REFLEX MICROSCOPIC
Glucose, UA: NEGATIVE mg/dL
Hgb urine dipstick: NEGATIVE
Ketones, ur: NEGATIVE mg/dL
Leukocytes, UA: NEGATIVE
Protein, ur: >300 mg/dL — AB
Urobilinogen, UA: 0.2 mg/dL (ref 0.0–1.0)

## 2011-10-12 LAB — URINE MICROSCOPIC-ADD ON

## 2011-10-12 LAB — POCT PREGNANCY, URINE: Preg Test, Ur: NEGATIVE

## 2011-10-12 LAB — WET PREP, GENITAL: Yeast Wet Prep HPF POC: NONE SEEN

## 2011-10-12 MED ORDER — METRONIDAZOLE 500 MG PO TABS: 500.0000 mg | ORAL_TABLET | Freq: Two times a day (BID) | ORAL | Status: DC

## 2011-10-12 MED ORDER — FLUCONAZOLE 150 MG PO TABS: 150.0000 mg | ORAL_TABLET | Freq: Once | ORAL | Status: AC

## 2011-10-12 NOTE — ED Notes (Signed)
Pt complains of vaginal itching, and abd pain, pt denies discharge but sts it is a yeast infection.

## 2011-10-12 NOTE — ED Provider Notes (Signed)
History     CSN: [id number]  Arrival date & time 10/12/11  1418   First MD Initiated Contact with Patient 10/12/11 1633      Chief Complaint  Patient presents with  . Vaginitis    (Consider location/radiation/quality/duration/timing/severity/associated sxs/prior treatment) HPI Comments: [name] is a 21 y.o. Female who presents with complaint of vaginal itching, and burning with urination. Pt states symptoms began several  Days ago. States unsure if there is vaginal discharge. No recent antibiotics. States she is sexually active with one partner. Denies fever, chills, abdominal pain, nausea, vomiting.    Past Medical History  Diagnosis Date  . Asthma   . Seizures     No past surgical history on file.  No family history on file.  History  Substance Use Topics  . Smoking status: Current Every Day Smoker -- 0.2 packs/day    Types: Cigarettes  . Smokeless tobacco: Not on file  . Alcohol Use: No    OB History    Grav Para Term Preterm Abortions TAB SAB Ect Mult Living                  Review of Systems  Constitutional: Negative for fever and chills.  HENT: Negative for neck pain and neck stiffness.   Respiratory: Negative.   Cardiovascular: Negative.   Gastrointestinal: Negative for nausea, vomiting, abdominal pain and diarrhea.  Genitourinary: Positive for dysuria. Negative for hematuria, flank pain, vaginal bleeding, vaginal discharge, genital sores and pelvic pain.  Musculoskeletal: Negative for back pain.  Skin: Negative.   Neurological: Negative for dizziness and weakness.    Allergies  Review of patient's allergies indicates no known allergies.  Home Medications  No current outpatient prescriptions on file.  BP 121/79  Pulse 90  Temp 98.1 F (36.7 C) (Oral)  Resp 20  SpO2 100%  Physical Exam  Nursing note and vitals reviewed. Constitutional: She is oriented to person, place, and time. She appears well-developed and well-nourished. No  distress.  Eyes: Conjunctivae normal are normal.  Cardiovascular: Normal rate, regular rhythm and normal heart sounds.   Pulmonary/Chest: Effort normal and breath sounds normal. No respiratory distress. She has no wheezes. She has no rales.  Abdominal: Soft. Bowel sounds are normal. She exhibits no distension. There is no tenderness. There is no rebound.  Genitourinary:       Normal external genitalia, no lesions or irritation. Normal vaginal canal. Cervix normal. White thin vaginal discharge. No CMT. NO adnexal tenderness.   Musculoskeletal: She exhibits no edema.  Neurological: She is alert and oriented to person, place, and time.  Skin: Skin is warm and dry.  Psychiatric: She has a normal mood and affect.    ED Course  Procedures (including critical care time)  PT here with vaginal irritation. Urine, urine preg,  GC/chlamydia, wet prep ordered.   Results for orders placed during the hospital encounter of 10/12/11  URINALYSIS, ROUTINE W REFLEX MICROSCOPIC      Component Value Range   Color, Urine YELLOW  YELLOW   APPearance CLEAR  CLEAR   Specific Gravity, Urine 1.028  1.005 - 1.030   pH 7.5  5.0 - 8.0   Glucose, UA NEGATIVE  NEGATIVE mg/dL   Hgb urine dipstick NEGATIVE  NEGATIVE   Bilirubin Urine NEGATIVE  NEGATIVE   Ketones, ur NEGATIVE  NEGATIVE mg/dL   Protein, ur >409 (*) NEGATIVE mg/dL   Urobilinogen, UA 0.2  0.0 - 1.0 mg/dL   Nitrite NEGATIVE  NEGATIVE  Leukocytes, UA NEGATIVE  NEGATIVE  POCT PREGNANCY, URINE      Component Value Range   Preg Test, Ur NEGATIVE  NEGATIVE  URINE MICROSCOPIC-ADD ON      Component Value Range   Squamous Epithelial / LPF FEW (*) RARE   WBC, UA 0-2  <3 WBC/hpf   RBC / HPF 0-2  <3 RBC/hpf   Bacteria, UA RARE  RARE   Urine-Other MUCOUS PRESENT    WET PREP, GENITAL      Component Value Range   Yeast Wet Prep HPF POC NONE SEEN  NONE SEEN   Trich, Wet Prep NONE SEEN  NONE SEEN   Clue Cells Wet Prep HPF POC FEW (*) NONE SEEN   WBC, Wet  Prep HPF POC FEW (*) NONE SEEN   Pt with unremarkable vaginal exam. Urine clean. Will treat with flagyl for BV, will give diflucan since pt is convinced she may have yeast infection. GC/chlamydia pending. Will d/c home with follow up.   1. Vaginitis   2. Bacterial vaginosis       MDM          Lottie Mussel, PA 10/13/11 [phone]

## 2011-10-13 LAB — GC/CHLAMYDIA PROBE AMP, GENITAL
Chlamydia, DNA Probe: NEGATIVE
GC Probe Amp, Genital: NEGATIVE

## 2011-10-13 NOTE — ED Provider Notes (Signed)
Medical screening examination/treatment/procedure(s) were performed by non-physician practitioner and as supervising physician I was immediately available for consultation/collaboration.  Flint Melter, MD 10/13/11 1318

## 2011-11-15 ENCOUNTER — Encounter (HOSPITAL_COMMUNITY): Payer: Self-pay | Admitting: *Deleted

## 2011-11-15 ENCOUNTER — Emergency Department (HOSPITAL_COMMUNITY): Payer: Medicaid Other

## 2011-11-15 ENCOUNTER — Emergency Department (HOSPITAL_COMMUNITY)
Admission: EM | Admit: 2011-11-15 | Discharge: 2011-11-15 | Disposition: A | Discharge status: Home / Self Care | Payer: Medicaid Other | Attending: Emergency Medicine | Admitting: Emergency Medicine

## 2011-11-15 DIAGNOSIS — J45909 Unspecified asthma, uncomplicated: Secondary | ICD-10-CM | POA: Insufficient documentation

## 2011-11-15 DIAGNOSIS — R350 Frequency of micturition: Secondary | ICD-10-CM | POA: Insufficient documentation

## 2011-11-15 DIAGNOSIS — R109 Unspecified abdominal pain: Secondary | ICD-10-CM | POA: Insufficient documentation

## 2011-11-15 DIAGNOSIS — F17200 Nicotine dependence, unspecified, uncomplicated: Secondary | ICD-10-CM | POA: Insufficient documentation

## 2011-11-15 DIAGNOSIS — K5900 Constipation, unspecified: Secondary | ICD-10-CM | POA: Insufficient documentation

## 2011-11-15 DIAGNOSIS — Z8669 Personal history of other diseases of the nervous system and sense organs: Secondary | ICD-10-CM | POA: Insufficient documentation

## 2011-11-15 LAB — URINE MICROSCOPIC-ADD ON

## 2011-11-15 LAB — URINALYSIS, ROUTINE W REFLEX MICROSCOPIC
Bilirubin Urine: NEGATIVE
Hgb urine dipstick: NEGATIVE
Ketones, ur: 15 mg/dL — AB
Specific Gravity, Urine: 1.028 (ref 1.005–1.030)
pH: 7 (ref 5.0–8.0)

## 2011-11-15 MED ORDER — POLYETHYLENE GLYCOL 3350 17 GM/SCOOP PO POWD: 17.0000 g | Freq: Every day | ORAL | Status: DC

## 2011-11-15 MED ORDER — BISACODYL 5 MG PO TBEC: 10.0000 mg | DELAYED_RELEASE_TABLET | Freq: Two times a day (BID) | ORAL | Status: DC

## 2011-11-15 NOTE — ED Notes (Signed)
Pt wants pregnancy test. No period since August. Denies vaginal discharge. States has intermittent sharp abd pain.

## 2011-11-15 NOTE — ED Notes (Signed)
Patient reports she has had abd pain for 2 weeks.  She missed her period in September.  She did have a period in October and reports it was normal.  She reports intermittent nausea

## 2011-11-15 NOTE — ED Notes (Signed)
Patient transported from X-ray 

## 2011-11-15 NOTE — ED Notes (Signed)
Patient transported to X-ray 

## 2011-11-15 NOTE — ED Provider Notes (Signed)
History  Scribed for Jones Skene, MD, the patient was seen in room TR11C/TR11C. This chart was scribed by Candelaria Stagers. The patient's care started at 3:09PM  CSN: [id number]  Arrival date & time 11/15/11  1416   First MD Initiated Contact with Patient 11/15/11 1508      Chief Complaint  Patient presents with  . Abdominal Pain    The history is provided by the patient. No language interpreter was used.   [name] is a 21 y.o. female who presents to the Emergency Department complaining of intermittent lower abdominal pain that started about two weeks ago and radiates to her lower back.  Pt reports that she missed her LMP and is concerned that she may be pregnant.  She denies headaches, vaginal discharge, vomiting, nausea, or diarrhea.  She states that her last bowel movement was one week ago and this is not typical for her.  She has also experienced increased urination.  Nothing seems to make the sx better or worse.   Past Medical History  Diagnosis Date  . Asthma   . Seizures     History reviewed. No pertinent past surgical history.  No family history on file.  History  Substance Use Topics  . Smoking status: Current Every Day Smoker -- 0.2 packs/day    Types: Cigarettes  . Smokeless tobacco: Not on file  . Alcohol Use: No    OB History    Grav Para Term Preterm Abortions TAB SAB Ect Mult Living                  Review of Systems A complete 10 system review of systems was obtained and all systems are negative except as noted in the HPI and PMH.   Allergies  Review of patient's allergies indicates no known allergies.  Home Medications   Current Outpatient Rx  Name Route Sig Dispense Refill  . IBUPROFEN 200 MG PO TABS Oral Take 200 mg by mouth every 8 (eight) hours as needed. For pain      BP 122/65  Pulse 91  Temp 97.8 F (36.6 C) (Oral)  Resp 16  SpO2 100%  Physical Exam  Nursing notes reviewed.  Electronic medical record  reviewed. VITAL SIGNS:   Filed Vitals:   11/15/11 1424  BP: 122/65  Pulse: 91  Temp: 97.8 F (36.6 C)  TempSrc: Oral  Resp: 16  SpO2: 100%   CONSTITUTIONAL: Awake, oriented, appears non-toxic HENT: Atraumatic, normocephalic, oral mucosa pink and moist, airway patent. Nares patent without drainage. External ears normal. EYES: Conjunctiva clear, EOMI, PERRLA NECK: Trachea midline, non-tender, supple CARDIOVASCULAR: Normal heart rate, Normal rhythm, No murmurs, rubs, gallops PULMONARY/CHEST: Clear to auscultation, no rhonchi, wheezes, or rales. Symmetrical breath sounds. Non-tender. ABDOMINAL: Non-distended, soft, non-tender - no rebound or guarding.  BS normal. NEUROLOGIC: Non-focal, moving all four extremities, no gross sensory or motor deficits. EXTREMITIES: No clubbing, cyanosis, or edema SKIN: Warm, Dry, No erythema, No rash  ED Course  Procedures   DIAGNOSTIC STUDIES:  COORDINATION OF CARE:  3:12PM Will order pregnancy test and get films of abdomen.  Pt understands and agrees.  3:15 Ordered: DG Abd 2 Views;  3:23 Ordered: Pregnancy, urine POC; Urine microscopic-add on, Urinalysis, Routine w relfex microscopic 4:39 PM Recheck: Discussed results with pt.     Labs Reviewed  URINALYSIS, ROUTINE W REFLEX MICROSCOPIC - Abnormal; Notable for the following:    Ketones, ur 15 (*)     Protein, ur 30 (*)  All other components within normal limits  URINE MICROSCOPIC-ADD ON - Abnormal; Notable for the following:    Squamous Epithelial / LPF FEW (*)     Crystals CA OXALATE CRYSTALS (*)     All other components within normal limits  POCT PREGNANCY, URINE   Dg Abd 2 Views  11/15/2011  *RADIOLOGY REPORT*  Clinical Data: Upper abdominal pain, bilateral flank pain, right greater than left  ABDOMEN - 2 VIEW  Comparison: 02/25/2011  Findings:  Nonobstructive bowel gas pattern.  No abnormal intra-abdominal calcifications. No pneumoperitoneum, pneumatosis or portal venous gas.   Limited visualization of the lower thorax is negative for focal airspace opacity or pleural effusion.  Unchanged bones.  IMPRESSION: Nonobstructive bowel gas pattern.   Original Report Authenticated By: Waynard Reeds, M.D.      1. Abdominal  pain, other specified site   2. Constipation       MDM  [name] is a 21 y.o. female presents for abdominal pain-she's concerned she is pregnant and hasn't had a period in 6 weeks. Patient does have a history of constipation. Denies any history of dysuria or frequency. Urinalysis is inconsistent with urinary tract infection, she's not pregnant, she is full of stool on my interpretation of the radiograph of the abdomen. Patient is relieved that she's not pregnant and will start a bowel regimen prescribed for her.  I explained the diagnosis and have given explicit precautions to return to the ER including any other new or worsening symptoms. The patient understands and accepts the medical plan as it's been dictated and I have answered their questions. Discharge instructions concerning home care and prescriptions have been given.  The patient is STABLE and is discharged to home in good condition.  I personally performed the services described in this documentation, which was scribed in my presence. The recorded information has been reviewed and considered. Jones Skene, M.D.         Jones Skene, MD 11/15/11 2133

## 2012-01-25 ENCOUNTER — Emergency Department (HOSPITAL_COMMUNITY)
Admission: EM | Admit: 2012-01-25 | Discharge: 2012-01-25 | Disposition: A | Discharge status: Home / Self Care | Payer: Medicaid Other | Attending: Emergency Medicine | Admitting: Emergency Medicine

## 2012-01-25 ENCOUNTER — Encounter (HOSPITAL_COMMUNITY): Payer: Self-pay | Admitting: Emergency Medicine

## 2012-01-25 DIAGNOSIS — Z8669 Personal history of other diseases of the nervous system and sense organs: Secondary | ICD-10-CM | POA: Insufficient documentation

## 2012-01-25 DIAGNOSIS — Z79899 Other long term (current) drug therapy: Secondary | ICD-10-CM | POA: Insufficient documentation

## 2012-01-25 DIAGNOSIS — F17200 Nicotine dependence, unspecified, uncomplicated: Secondary | ICD-10-CM | POA: Insufficient documentation

## 2012-01-25 DIAGNOSIS — J45909 Unspecified asthma, uncomplicated: Secondary | ICD-10-CM | POA: Insufficient documentation

## 2012-01-25 DIAGNOSIS — R109 Unspecified abdominal pain: Secondary | ICD-10-CM | POA: Insufficient documentation

## 2012-01-25 MED ORDER — NAPROXEN 500 MG PO TABS: ORAL_TABLET | ORAL | Status: DC

## 2012-01-25 NOTE — ED Notes (Signed)
Per patient she has a "knot in her stomach" pt states it hurts when she touches it. Pt denies any abdominal history.

## 2012-01-25 NOTE — ED Provider Notes (Signed)
History     CSN: [id number]  Arrival date & time 01/25/12  1152   First MD Initiated Contact with Patient 01/25/12 1233      Chief Complaint  Patient presents with  . Abdominal Pain    (Consider location/radiation/quality/duration/timing/severity/associated sxs/prior treatment) Patient is a 22 y.o. female presenting with abdominal pain. The history is provided by the patient (pt complains of abd pain).  Abdominal Pain The primary symptoms of the illness include abdominal pain. The primary symptoms of the illness do not include fatigue or diarrhea. The current episode started more than 2 days ago. The onset of the illness was gradual. The problem has been resolved.  Associated with: nothing. The patient states that she believes she is currently not pregnant. The patient has not had a change in bowel habit. Symptoms associated with the illness do not include heartburn, constipation, hematuria, frequency or back pain. Significant associated medical issues do not include PUD.    Past Medical History  Diagnosis Date  . Asthma   . Seizures     History reviewed. No pertinent past surgical history.  No family history on file.  History  Substance Use Topics  . Smoking status: Current Every Day Smoker -- 0.2 packs/day    Types: Cigarettes  . Smokeless tobacco: Not on file  . Alcohol Use: No    OB History    Grav Para Term Preterm Abortions TAB SAB Ect Mult Living                  Review of Systems  Constitutional: Negative for fatigue.  HENT: Negative for congestion, sinus pressure and ear discharge.   Eyes: Negative for discharge.  Respiratory: Negative for cough.   Cardiovascular: Negative for chest pain.  Gastrointestinal: Positive for abdominal pain. Negative for heartburn, diarrhea and constipation.  Genitourinary: Negative for frequency and hematuria.  Musculoskeletal: Negative for back pain.  Skin: Negative for rash.  Neurological: Negative for seizures and  headaches.  Hematological: Negative.   Psychiatric/Behavioral: Negative for hallucinations.    Allergies  Review of patient's allergies indicates no known allergies.  Home Medications   Current Outpatient Rx  Name  Route  Sig  Dispense  Refill  . ACETAMINOPHEN 325 MG PO TABS   Oral   Take 325 mg by mouth every 6 (six) hours as needed. For pain and headache         . IBUPROFEN 200 MG PO TABS   Oral   Take 400 mg by mouth every 8 (eight) hours as needed. For pain         . NAPROXEN 500 MG PO TABS      Take twice a day for pain   30 tablet   0     BP 109/83  Pulse 69  Temp 98.5 F (36.9 C) (Oral)  Resp 16  SpO2 100%  LMP 01/21/2012  Physical Exam  Constitutional: She is oriented to person, place, and time. She appears well-developed.  HENT:  Head: Normocephalic and atraumatic.  Eyes: Conjunctivae normal and EOM are normal. No scleral icterus.  Neck: Neck supple. No thyromegaly present.  Cardiovascular: Normal rate and regular rhythm.  Exam reveals no gallop and no friction rub.   No murmur heard. Pulmonary/Chest: No stridor. She has no wheezes. She has no rales. She exhibits no tenderness.  Abdominal: She exhibits no distension. There is tenderness. There is no rebound.       Mild tenderness suprapubic.  Feels like .5 cm cyst in  abd wall  Musculoskeletal: Normal range of motion. She exhibits no edema.  Lymphadenopathy:    She has no cervical adenopathy.  Neurological: She is oriented to person, place, and time. Coordination normal.  Skin: No rash noted. No erythema.  Psychiatric: She has a normal mood and affect. Her behavior is normal.    ED Course  Procedures (including critical care time)  Labs Reviewed - No data to display No results found.   1. Abdominal wall pain       MDM          Benny Lennert, MD 01/25/12 1324

## 2012-02-26 ENCOUNTER — Emergency Department (INDEPENDENT_AMBULATORY_CARE_PROVIDER_SITE_OTHER)
Admission: EM | Admit: 2012-02-26 | Discharge: 2012-02-26 | Disposition: A | Discharge status: Home / Self Care | Payer: Medicaid Other | Source: Home / Self Care | Attending: Emergency Medicine | Admitting: Emergency Medicine

## 2012-02-26 ENCOUNTER — Encounter (HOSPITAL_COMMUNITY): Payer: Self-pay | Admitting: *Deleted

## 2012-02-26 DIAGNOSIS — K2970 Gastritis, unspecified, without bleeding: Secondary | ICD-10-CM

## 2012-02-26 LAB — POCT PREGNANCY, URINE: Preg Test, Ur: NEGATIVE

## 2012-02-26 LAB — POCT URINALYSIS DIP (DEVICE)
Glucose, UA: NEGATIVE mg/dL
Nitrite: POSITIVE — AB
Urobilinogen, UA: 2 mg/dL — ABNORMAL HIGH (ref 0.0–1.0)

## 2012-02-26 MED ORDER — TRIAMCINOLONE ACETONIDE 0.1 % EX CREA: TOPICAL_CREAM | Freq: Two times a day (BID) | CUTANEOUS | Status: AC

## 2012-02-26 MED ORDER — OMEPRAZOLE 40 MG PO CPDR: 40.0000 mg | DELAYED_RELEASE_CAPSULE | Freq: Every day | ORAL | Status: DC

## 2012-02-26 NOTE — ED Notes (Signed)
Onset 2 days ago upper ABD pain--mainly in the evenings about 1900.  The pain is sharp, crampy and intermittent.  She denies N or V ,but has had diarrhea X 1 today with fever last night.

## 2012-02-26 NOTE — ED Provider Notes (Signed)
History     CSN: [id number]  Arrival date & time 02/26/12  1449   First MD Initiated Contact with Patient 02/26/12 1508      Chief Complaint  Patient presents with  . Abdominal Pain    (Consider location/radiation/quality/duration/timing/severity/associated sxs/prior treatment) HPI Comments: Patient presents urgent care describing that since last night she has been having cramping upper of stomach pains that come and go ( patient points to epigastric region), patient denies any nausea vomiting but describes that this morning she did have an episode of liquidy diarrhea. She feels that perhaps last night also had a tactile fever. She is without any appetite has not vomited no vaginal bleeding or discharge. She describes she smokes 3-5 cigarettes per day does not drink any alcohol she describes and has not smoked cannabis recently. Patient has not tried to take any medicines over-the-counter for her pain   Patient also describes that for about a week she has had an itchy rash to the right posterior aspect of her neck and her right upper shoulder." Tiny little red bumps that itch"  Patient is a 22 y.o. female presenting with abdominal pain and rash. The history is provided by the patient.  Abdominal Pain The primary symptoms of the illness include abdominal pain and diarrhea. The primary symptoms of the illness do not include fever, fatigue, shortness of breath, nausea, vomiting, hematemesis, hematochezia, dysuria, vaginal discharge or vaginal bleeding. The current episode started yesterday. The onset of the illness was gradual. The problem has not changed since onset. Additional symptoms associated with the illness include anorexia and heartburn. Symptoms associated with the illness do not include chills, diaphoresis, constipation, urgency, hematuria, frequency or back pain. Significant associated medical issues do not include PUD, inflammatory bowel disease, gallstones or diverticulitis.  Rash    This is a new problem. The current episode started more than 1 week ago. The problem has been gradually worsening. The problem is associated with nothing. The rash is present on the torso, groin and neck. The pain is at a severity of 2/10. The pain is mild. The pain has been constant since onset. Associated symptoms include itching. Pertinent negatives include no pain and no weeping. She has tried antihistamines for the symptoms.    Past Medical History  Diagnosis Date  . Asthma   . Seizures     History reviewed. No pertinent past surgical history.  History reviewed. No pertinent family history.  History  Substance Use Topics  . Smoking status: Current Every Day Smoker -- 0.2 packs/day    Types: Cigarettes  . Smokeless tobacco: Not on file  . Alcohol Use: No    OB History    Grav Para Term Preterm Abortions TAB SAB Ect Mult Living                  Review of Systems  Constitutional: Positive for activity change and appetite change. Negative for fever, chills, diaphoresis and fatigue.  HENT: Negative for facial swelling and neck pain.   Respiratory: Negative for shortness of breath.   Gastrointestinal: Positive for heartburn, abdominal pain, diarrhea and anorexia. Negative for nausea, vomiting, constipation, blood in stool, hematochezia, abdominal distention, anal bleeding, rectal pain and hematemesis.  Genitourinary: Negative for dysuria, urgency, frequency, hematuria, vaginal bleeding and vaginal discharge.  Musculoskeletal: Negative for back pain and arthralgias.  Skin: Positive for itching. Negative for rash.    Allergies  Review of patient's allergies indicates no known allergies.  Home Medications   Current Outpatient  Rx  Name  Route  Sig  Dispense  Refill  . OMEPRAZOLE 40 MG PO CPDR   Oral   Take 1 capsule (40 mg total) by mouth daily.   14 capsule   0   . TRIAMCINOLONE ACETONIDE 0.1 % EX CREA   Topical   Apply topically 2 (two) times daily. Apply bid  affected areas   30 g   0     BP 116/80  Pulse 92  Temp 98.7 F (37.1 C) (Oral)  Resp 19  SpO2 100%  LMP 02/22/2012  Physical Exam  Nursing note and vitals reviewed. Constitutional: Vital signs are normal. She appears well-developed and well-nourished.  Non-toxic appearance. She does not have a sickly appearance. She does not appear ill. No distress.  Eyes: Conjunctivae normal are normal. No scleral icterus.  Neck: Neck supple. No JVD present.  Pulmonary/Chest: Effort normal and breath sounds normal. No respiratory distress. She has no wheezes. She has no rales. She exhibits no tenderness.  Abdominal: Soft. Normal appearance. She exhibits no shifting dullness, no distension, no pulsatile liver, no fluid wave, no abdominal bruit, no ascites, no pulsatile midline mass and no mass. There is no hepatosplenomegaly or hepatomegaly. There is tenderness in the epigastric area. There is no rigidity, no rebound, no guarding, no CVA tenderness, no tenderness at McBurney's point and negative Murphy's sign. No hernia. Hernia confirmed negative in the ventral area.    Lymphadenopathy:    She has no cervical adenopathy.  Neurological: She is alert.  Skin: No rash noted. No erythema.    ED Course  Procedures (including critical care time)  Labs Reviewed  POCT URINALYSIS DIP (DEVICE) - Abnormal; Notable for the following:    Bilirubin Urine SMALL (*)     Ketones, ur TRACE (*)     Hgb urine dipstick TRACE (*)     Protein, ur >=300 (*)     Urobilinogen, UA 2.0 (*)     Nitrite POSITIVE (*)     All other components within normal limits  POCT PREGNANCY, URINE  URINE CULTURE   No results found.   1. Gastritis     Bedside ultrasound performed, for informational non-diagnostic purposes. We were able to visualize a clear morsels pouch kidney shape contour a sagittal view of the gallbladder with no obvious obstruction.  MDM  Patient will be prescribed a seven-day course of a PPI. ( With  Prilosec)  instructed patient to discontinue smoking. We discuss abnormal your results. Patient denies any urinary symptoms unable to reproduce any lower abdominal pain. Have instructed patient to followup with primary care Dr. repeat her urine in one or 2 weeks. Patient agrees with treatment plan and followup care as discussed. We also discussed what symptoms should warrant further evaluation in the emergency department.        Jimmie Molly, MD 02/26/12 [phone]

## 2012-02-28 LAB — URINE CULTURE

## 2012-03-03 MED ORDER — CEPHALEXIN 500 MG PO CAPS: 500.0000 mg | ORAL_CAPSULE | Freq: Three times a day (TID) | ORAL | Status: AC

## 2012-03-06 ENCOUNTER — Telehealth (HOSPITAL_COMMUNITY): Payer: Self-pay | Admitting: *Deleted

## 2012-03-06 NOTE — ED Notes (Signed)
2/10  Urine culture: >100,000 colonies E. Coli.  Keflex e-prescribed by Dr. Ladon Applebaum to [facility] on [address]  2/13 I called pt. and number was invalid. I called her contact- pt.'s mother and left a message for pt. to call. Vassie Moselle 03/06/2012

## 2012-03-10 ENCOUNTER — Telehealth (HOSPITAL_COMMUNITY): Payer: Self-pay | Admitting: *Deleted

## 2012-03-11 ENCOUNTER — Telehealth (HOSPITAL_COMMUNITY): Payer: Self-pay | Admitting: *Deleted

## 2012-03-11 NOTE — ED Notes (Signed)
Unable to contact pt. by phone x 3.  Letter sent instructing pt. to pick up Rx. of Keflex at her pharmacy for her UTI. Vassie Moselle 03/11/2012

## 2012-04-28 ENCOUNTER — Inpatient Hospital Stay (HOSPITAL_COMMUNITY)
Admission: AD | Admit: 2012-04-28 | Discharge: 2012-04-28 | Disposition: A | Discharge status: Home / Self Care | Payer: Medicaid Other | Source: Ambulatory Visit | Attending: Obstetrics & Gynecology | Admitting: Obstetrics & Gynecology

## 2012-04-28 ENCOUNTER — Encounter (HOSPITAL_COMMUNITY): Payer: Self-pay

## 2012-04-28 DIAGNOSIS — Z3202 Encounter for pregnancy test, result negative: Secondary | ICD-10-CM | POA: Insufficient documentation

## 2012-04-28 LAB — POCT PREGNANCY, URINE: Preg Test, Ur: NEGATIVE

## 2012-04-28 NOTE — [facility]U Provider Note (Signed)
Ms. [name] is a 22 y.o. G0 female who presents to [facility]U today for a pregnancy test. The patient states that LMP was 04/21/12. She had a few sharp pains across her abdomen 2 days ago but they resolved quickly without treatment and have not resumed. The patient has no complaints today. She is not having any vaginal bleeding, abnormal discharge or N/V. She receives primary care at [facility], but has never had a GYN annual exam.   BP 121/63  Pulse 88  Temp(Src) 98.1 F (36.7 C) (Oral)  Resp 16  SpO2 100% GENERAL: Well-developed, well-nourished female in no acute distress.  HEENT: Normocephalic, atraumatic.  LUNGS: Effort normal HEART: Regular rate SKIN: Warm, dry and without erythema PSYCH: Normal mood and affect. Patient exhibits delayed responses.   Results for orders placed during the hospital encounter of 04/28/12 (from the past 24 hour(s))  POCT PREGNANCY, URINE     Status: None   Collection Time    04/28/12  5:06 PM      Result Value Range   Preg Test, Ur NEGATIVE  NEGATIVE    A: Negative pregnancy test  P: Discharge home Patient encouraged to use condoms for birth control as she does not desire pregnancy at this time Patient referred to [facility] clinic for annual exams and birth control management Patient may return to [facility]U as needed or if her condition were to change or worsen  Freddi Starr, PA-C 04/28/2012 5:52 PM

## 2012-04-28 NOTE — MAU Provider Note (Signed)
Attestation of Attending Supervision of Advanced Practitioner (CNM/NP): Evaluation and management procedures were performed by the Advanced Practitioner under my supervision and collaboration.  I have reviewed the Advanced Practitioner's note and chart, and I agree with the management and plan.  HARRAWAY-SMITH, Nabila Albarracin 7:00 PM     

## 2012-04-28 NOTE — MAU Note (Signed)
Patient states she thinks she has missed a period and might be pregnancy. States she had sharp abdominal pain 2 days ago but not at this time. Denies any bleeding or vaginal discharge.

## 2012-04-30 ENCOUNTER — Encounter: Payer: Self-pay | Admitting: *Deleted

## 2012-05-23 ENCOUNTER — Encounter: Payer: Medicaid Other | Admitting: Medical

## 2012-06-19 ENCOUNTER — Encounter (HOSPITAL_COMMUNITY): Payer: Self-pay | Admitting: Nurse Practitioner

## 2012-06-19 ENCOUNTER — Emergency Department (HOSPITAL_COMMUNITY)
Admission: EM | Admit: 2012-06-19 | Discharge: 2012-06-19 | Disposition: A | Discharge status: Home / Self Care | Payer: Medicaid Other | Attending: Emergency Medicine | Admitting: Emergency Medicine

## 2012-06-19 DIAGNOSIS — Z8669 Personal history of other diseases of the nervous system and sense organs: Secondary | ICD-10-CM | POA: Insufficient documentation

## 2012-06-19 DIAGNOSIS — N898 Other specified noninflammatory disorders of vagina: Secondary | ICD-10-CM

## 2012-06-19 DIAGNOSIS — Z3202 Encounter for pregnancy test, result negative: Secondary | ICD-10-CM | POA: Insufficient documentation

## 2012-06-19 DIAGNOSIS — R300 Dysuria: Secondary | ICD-10-CM | POA: Insufficient documentation

## 2012-06-19 DIAGNOSIS — F17200 Nicotine dependence, unspecified, uncomplicated: Secondary | ICD-10-CM | POA: Insufficient documentation

## 2012-06-19 DIAGNOSIS — L293 Anogenital pruritus, unspecified: Secondary | ICD-10-CM | POA: Insufficient documentation

## 2012-06-19 DIAGNOSIS — N760 Acute vaginitis: Secondary | ICD-10-CM | POA: Insufficient documentation

## 2012-06-19 DIAGNOSIS — J45909 Unspecified asthma, uncomplicated: Secondary | ICD-10-CM | POA: Insufficient documentation

## 2012-06-19 LAB — URINALYSIS, ROUTINE W REFLEX MICROSCOPIC
Ketones, ur: NEGATIVE mg/dL
Leukocytes, UA: NEGATIVE
Nitrite: NEGATIVE
Specific Gravity, Urine: 1.01 (ref 1.005–1.030)
pH: 7.5 (ref 5.0–8.0)

## 2012-06-19 LAB — WET PREP, GENITAL: Clue Cells Wet Prep HPF POC: NONE SEEN

## 2012-06-19 LAB — POCT PREGNANCY, URINE: Preg Test, Ur: NEGATIVE

## 2012-06-19 NOTE — ED Notes (Signed)
Pt called in main ED waiting room with no response 

## 2012-06-19 NOTE — ED Notes (Signed)
Pt states she has a yeast infection x 1 week. Describes as burning pain.

## 2012-06-19 NOTE — ED Provider Notes (Signed)
History     CSN: [id number]  Arrival date & time 06/19/12  1117   First MD Initiated Contact with Patient 06/19/12 1151      Chief Complaint  Patient presents with  . Vaginitis    (Consider location/radiation/quality/duration/timing/severity/associated sxs/prior treatment) HPI Comments: Patient is a 22 year old female with a past medical history of epilepsy who presents with a 1 week history of vaginal burning. Symptoms started gradually and progressively worsened since the onset. No aggravating/alleviating factors. Patient reports dysuria as well. Patient has not tried anything for symptoms.  Patient reports having a yeast infection previously that felt just like this. Patient states she has never been sexually active.    Past Medical History  Diagnosis Date  . Asthma   . Seizures     Past Surgical History  Procedure Laterality Date  . No past surgeries      History reviewed. No pertinent family history.  History  Substance Use Topics  . Smoking status: Current Every Day Smoker -- 0.25 packs/day    Types: Cigarettes  . Smokeless tobacco: Not on file  . Alcohol Use: Yes     Comment: rare    OB History   Grav Para Term Preterm Abortions TAB SAB Ect Mult Living   0               Review of Systems  Genitourinary: Positive for vaginal pain.  All other systems reviewed and are negative.    Allergies  Review of patient's allergies indicates no known allergies.  Home Medications   Current Outpatient Rx  Name  Route  Sig  Dispense  Refill  . EXPIRED: omeprazole (PRILOSEC) 40 MG capsule   Oral   Take 1 capsule (40 mg total) by mouth daily.   14 capsule   0     BP 123/72  Pulse 70  Temp(Src) 98 F (36.7 C) (Oral)  Resp 16  SpO2 98%  Physical Exam  Nursing note and vitals reviewed. Constitutional: She is oriented to person, place, and time. She appears well-developed and well-nourished. No distress.  HENT:  Head: Normocephalic and atraumatic.  Eyes:  Conjunctivae and EOM are normal.  Neck: Normal range of motion.  Cardiovascular: Normal rate and regular rhythm.  Exam reveals no gallop and no friction rub.   No murmur heard. Pulmonary/Chest: Effort normal and breath sounds normal. She has no wheezes. She has no rales. She exhibits no tenderness.  Abdominal: Soft. She exhibits no distension. There is no tenderness. There is no rebound and no guarding.  Genitourinary: Vagina normal. No vaginal discharge found.  No discharge or CMT. Cervical os closed.   Musculoskeletal: Normal range of motion.  Neurological: She is alert and oriented to person, place, and time. Coordination normal.  Speech is goal-oriented. Moves limbs without ataxia.   Skin: Skin is warm and dry.  Psychiatric: She has a normal mood and affect. Her behavior is normal.    ED Course  Procedures (including critical care time)  Labs Reviewed  WET PREP, GENITAL - Abnormal; Notable for the following:    WBC, Wet Prep HPF POC FEW (*)    All other components within normal limits  GC/CHLAMYDIA PROBE AMP  URINALYSIS, ROUTINE W REFLEX MICROSCOPIC  POCT PREGNANCY, URINE   No results found.   1. Vaginal itching       MDM  11:53 AM Urinalysis and urine pregnancy pending. Patient will have pelvic.   12:43 PM Pelvic done. Wet prep and GC/Chlamydia pending.  2:44 PM Urinalysis and wet prep unremarkable. I have no current explanation for the patient's vaginal burning and dysuria. Patient instructed to follow up with her PCP for further evaluation and management. Vitals stable and patient afebrile. GC/Chlamydia pending and patient will be contacted with positive results if necessary.       Emilia Beck, PA-C 06/19/12 1450

## 2012-06-20 NOTE — ED Provider Notes (Signed)
Medical screening examination/treatment/procedure(s) were performed by non-physician practitioner and as supervising physician I was immediately available for consultation/collaboration.    Vida Roller, MD 06/20/12 [phone]

## 2012-07-10 ENCOUNTER — Encounter (HOSPITAL_COMMUNITY): Payer: Self-pay | Admitting: *Deleted

## 2012-07-10 ENCOUNTER — Inpatient Hospital Stay (HOSPITAL_COMMUNITY)
Admission: AD | Admit: 2012-07-10 | Discharge: 2012-07-11 | Disposition: A | Discharge status: Home / Self Care | Payer: Medicaid Other | Source: Ambulatory Visit | Attending: Obstetrics and Gynecology | Admitting: Obstetrics and Gynecology

## 2012-07-10 DIAGNOSIS — B3731 Acute candidiasis of vulva and vagina: Secondary | ICD-10-CM | POA: Insufficient documentation

## 2012-07-10 DIAGNOSIS — Z3401 Encounter for supervision of normal first pregnancy, first trimester: Secondary | ICD-10-CM

## 2012-07-10 DIAGNOSIS — O2390 Unspecified genitourinary tract infection in pregnancy, unspecified trimester: Secondary | ICD-10-CM | POA: Insufficient documentation

## 2012-07-10 DIAGNOSIS — A5901 Trichomonal vulvovaginitis: Secondary | ICD-10-CM

## 2012-07-10 DIAGNOSIS — O98819 Other maternal infectious and parasitic diseases complicating pregnancy, unspecified trimester: Secondary | ICD-10-CM | POA: Insufficient documentation

## 2012-07-10 DIAGNOSIS — N949 Unspecified condition associated with female genital organs and menstrual cycle: Secondary | ICD-10-CM | POA: Insufficient documentation

## 2012-07-10 DIAGNOSIS — N831 Corpus luteum cyst of ovary, unspecified side: Secondary | ICD-10-CM | POA: Insufficient documentation

## 2012-07-10 DIAGNOSIS — O34599 Maternal care for other abnormalities of gravid uterus, unspecified trimester: Secondary | ICD-10-CM | POA: Insufficient documentation

## 2012-07-10 DIAGNOSIS — R109 Unspecified abdominal pain: Secondary | ICD-10-CM | POA: Insufficient documentation

## 2012-07-10 DIAGNOSIS — B373 Candidiasis of vulva and vagina: Secondary | ICD-10-CM

## 2012-07-10 LAB — URINALYSIS, ROUTINE W REFLEX MICROSCOPIC
Bilirubin Urine: NEGATIVE
Leukocytes, UA: NEGATIVE
Nitrite: NEGATIVE
Specific Gravity, Urine: >1.03 — ABNORMAL HIGH (ref 1.005–1.030)
pH: 6 (ref 5.0–8.0)

## 2012-07-10 LAB — URINE MICROSCOPIC-ADD ON

## 2012-07-10 LAB — POCT PREGNANCY, URINE: Preg Test, Ur: POSITIVE — AB

## 2012-07-10 NOTE — MAU Note (Signed)
PT SAYS SHE HAS ABD   PAIN-  ALL OVER- --  STARTED LAST  Thursday-  -    HAS  NOT TAKEN ANYTHING FOR PAIN.    LAST SEX-   6-9 .  NOT BIRTH CONTROL.   SHE DID HOME PREG TEST  ON 6-3-  NEG.   .  NO VOMITING.   HAD RUNNY BM LAST NIGHT.   NO VAG BLEEDING.   THINKS SHE HAD A FEVER - NOT TAKEN TEMP.    ATE LAST  AT 915PM.

## 2012-07-10 NOTE — MAU Provider Note (Signed)
Chief Complaint: No chief complaint on file.   First Provider Initiated Contact with Patient 07/11/12 0351     SUBJECTIVE HPI: [name] is a 22 y.o. G0P0 female who presents with worsening generalized abd pain x 1 week. Worse on right side and low abd pain. Aslo reports large amount of white vaginal discharge. LMP unknown date in April. States home UPT 06/24/12 neg. Has not tried anything for the pain. Pt is sexually active, no contraception. Describes pain as constant, sharp. Declines pain meds.   Past Medical History  Diagnosis Date  . Asthma   . Seizures    OB History   Grav Para Term Preterm Abortions TAB SAB Ect Mult Living   0              Past Surgical History  Procedure Laterality Date  . No past surgeries     History   Social History  . Marital Status: Single    Spouse Name: N/A    Number of Children: N/A  . Years of Education: N/A   Occupational History  . Not on file.   Social History Main Topics  . Smoking status: Current Every Day Smoker -- 0.25 packs/day    Types: Cigarettes  . Smokeless tobacco: Not on file  . Alcohol Use: Yes     Comment: rare  . Drug Use: Yes    Special: Marijuana  . Sexually Active: Yes    Birth Control/ Protection: None   Other Topics Concern  . Not on file   Social History Narrative  . No narrative on file   No current facility-administered medications on file prior to encounter.   No current outpatient prescriptions on file prior to encounter.   No Known Allergies  ROS: Pos for subjective fever (did not check w/ thermometer), abd pain, loose BM x 1 last night. Neg for chills, nausea, vomiting, constipation, vaginal bleeding, urinary complaints.   OBJECTIVE Blood pressure 123/70, pulse 89, temperature 98.2 F (36.8 C), temperature source Oral, resp. rate 18, height 5\' 6"  (1.676 m), weight 52.164 kg (115 lb). GENERAL: Well-developed, well-nourished female inmild distress. Immature behavour. Poor comprehension of  basic medical terms. Poor historian. HEENT: Normocephalic HEART: normal rate RESP: normal effort ABDOMEN: Soft, mild generalized RLQ tenderness, difficult to assess for mass due to abd muscle tone and guarding throughout entire abd exam. Neg rebound tenderness. Pos BS.  EXTREMITIES: Nontender, no edema NEURO: Alert and oriented SPECULUM EXAM: NEFG, large amount of thick, white, malodorous discharge, no blood noted, cervix clean BIMANUAL: cervix closed; uterus normal size, no adnexal tenderness or masses. No CMT.   LAB RESULTS Results for orders placed during the hospital encounter of 07/10/12 (from the past 24 hour(s))  URINALYSIS, ROUTINE W REFLEX MICROSCOPIC     Status: Abnormal   Collection Time    07/10/12 10:00 PM      Result Value Range   Color, Urine YELLOW  YELLOW   APPearance CLEAR  CLEAR   Specific Gravity, Urine >1.030 (*) 1.005 - 1.030   pH 6.0  5.0 - 8.0   Glucose, UA NEGATIVE  NEGATIVE mg/dL   Hgb urine dipstick NEGATIVE  NEGATIVE   Bilirubin Urine NEGATIVE  NEGATIVE   Ketones, ur 15 (*) NEGATIVE mg/dL   Protein, ur 30 (*) NEGATIVE mg/dL   Urobilinogen, UA 0.2  0.0 - 1.0 mg/dL   Nitrite NEGATIVE  NEGATIVE   Leukocytes, UA NEGATIVE  NEGATIVE  URINE MICROSCOPIC-ADD ON     Status: None  Collection Time    07/10/12 10:00 PM      Result Value Range   Squamous Epithelial / LPF RARE  RARE   WBC, UA 0-2  <3 WBC/hpf   RBC / HPF 0-2  <3 RBC/hpf   Urine-Other MUCOUS PRESENT    POCT PREGNANCY, URINE     Status: Abnormal   Collection Time    07/10/12 10:08 PM      Result Value Range   Preg Test, Ur POSITIVE (*) NEGATIVE  HCG, QUANTITATIVE, PREGNANCY     Status: Abnormal   Collection Time    07/11/12 12:06 AM      Result Value Range   hCG, Beta Chain, Quant, S 7540 (*) <5 mIU/mL  CBC     Status: Abnormal   Collection Time    07/11/12 12:06 AM      Result Value Range   WBC 6.5  4.0 - 10.5 K/uL   RBC 4.15  3.87 - 5.11 MIL/uL   Hemoglobin 10.7 (*) 12.0 - 15.0 g/dL    HCT 16.1 (*) 09.6 - 46.0 %   MCV 77.6 (*) 78.0 - 100.0 fL   MCH 25.8 (*) 26.0 - 34.0 pg   MCHC 33.2  30.0 - 36.0 g/dL   RDW 04.5  40.9 - 81.1 %   Platelets 238  150 - 400 K/uL  COMPREHENSIVE METABOLIC PANEL     Status: None   Collection Time    07/11/12 12:06 AM      Result Value Range   Sodium 135  135 - 145 mEq/L   Potassium 3.5  3.5 - 5.1 mEq/L   Chloride 102  96 - 112 mEq/L   CO2 24  19 - 32 mEq/L   Glucose, Bld 89  70 - 99 mg/dL   BUN 10  6 - 23 mg/dL   Creatinine, Ser 9.14  0.50 - 1.10 mg/dL   Calcium 9.1  8.4 - 78.2 mg/dL   Total Protein 6.8  6.0 - 8.3 g/dL   Albumin 3.6  3.5 - 5.2 g/dL   AST 16  0 - 37 U/L   ALT 9  0 - 35 U/L   Alkaline Phosphatase 68  39 - 117 U/L   Total Bilirubin 0.3  0.3 - 1.2 mg/dL   GFR calc non Af Amer >90  >90 mL/min   GFR calc Af Amer >90  >90 mL/min  ABO/RH     Status: None   Collection Time    07/11/12 12:06 AM      Result Value Range   ABO/RH(D) O POS    WET PREP, GENITAL     Status: Abnormal   Collection Time    07/11/12  3:05 AM      Result Value Range   Yeast Wet Prep HPF POC MODERATE (*) NONE SEEN   Trich, Wet Prep FEW (*) NONE SEEN   Clue Cells Wet Prep HPF POC FEW (*) NONE SEEN   WBC, Wet Prep HPF POC FEW (*) NONE SEEN    IMAGING   MAU COURSE Flagyl, Diflucan given in MAU.  ASSESSMENT 1. Abdominal pain in pregnancy   2. Trichomonal vaginitis in pregnancy in first trimester   3. Vaginal yeast infection   4. Corpus luteum cyst   5. Normal first pregnancy with uncertain date of LMP, antepartum, first trimester    PLAN Discharge home in stable condition. Gonorrhea and Chlamydia cultures pending.  You have been diagnosed with Trichomonas, a sexually transmitted disease. You have  been treated for it in Maternity Admissions. Your partner needs to be treated. Do not have sex until 1 week after BOTH of you have been treated. You should be tested for all sexually transmitted diseases. This can be done at the Health Department  or you Obstetrician's office when you start prenatal care. You have been tested for Gonorrhea and Chlamydia today. You will be called in 3 days if your test results are positive. Always use condoms. Follow-up Information   Follow up with [facility].      Follow up with THE [facility] [place] [facility] ADMISSIONS. (As needed in emergencies)    Contact information:   [address] [id number] [place] [address] [phone]      Follow up with THE [facility] OF [place] ULTRASOUND On 07/21/2012.   Contact information:   [address] [id number] [place] [address] [phone]        Medication List    TAKE these medications       CONCEPT OB 130-92.4-1 MG Caps  Take 1 tablet by mouth daily.       [place], CNM 07/10/2012  10:50 PM

## 2012-07-11 ENCOUNTER — Inpatient Hospital Stay (HOSPITAL_COMMUNITY): Payer: Medicaid Other

## 2012-07-11 ENCOUNTER — Encounter (HOSPITAL_COMMUNITY): Payer: Self-pay | Admitting: Advanced Practice Midwife

## 2012-07-11 DIAGNOSIS — R109 Unspecified abdominal pain: Secondary | ICD-10-CM

## 2012-07-11 DIAGNOSIS — O26899 Other specified pregnancy related conditions, unspecified trimester: Secondary | ICD-10-CM

## 2012-07-11 LAB — CBC
HCT: 32.2 % — ABNORMAL LOW (ref 36.0–46.0)
Hemoglobin: 10.7 g/dL — ABNORMAL LOW (ref 12.0–15.0)
MCH: 25.8 pg — ABNORMAL LOW (ref 26.0–34.0)
MCV: 77.6 fL — ABNORMAL LOW (ref 78.0–100.0)
Platelets: 238 10*3/uL (ref 150–400)
RBC: 4.15 MIL/uL (ref 3.87–5.11)
WBC: 6.5 10*3/uL (ref 4.0–10.5)

## 2012-07-11 LAB — COMPREHENSIVE METABOLIC PANEL
AST: 16 U/L (ref 0–37)
BUN: 10 mg/dL (ref 6–23)
CO2: 24 mEq/L (ref 19–32)
Calcium: 9.1 mg/dL (ref 8.4–10.5)
Chloride: 102 mEq/L (ref 96–112)
Creatinine, Ser: 0.75 mg/dL (ref 0.50–1.10)
GFR calc Af Amer: >90 mL/min (ref >90)
GFR calc non Af Amer: >90 mL/min (ref >90)
Glucose, Bld: 89 mg/dL (ref 70–99)
Total Bilirubin: 0.3 mg/dL (ref 0.3–1.2)

## 2012-07-11 LAB — ABO/RH: ABO/RH(D): O POS

## 2012-07-11 LAB — WET PREP, GENITAL

## 2012-07-11 MED ORDER — ONDANSETRON HCL 4 MG PO TABS
4.0000 mg | ORAL_TABLET | Freq: Once | ORAL | Status: AC
  Administered 2012-07-11: 4 mg via ORAL
  Filled 2012-07-11: qty 1

## 2012-07-11 MED ORDER — CONCEPT OB 130-92.4-1 MG PO CAPS: 1.0000 | ORAL_CAPSULE | Freq: Every day | ORAL | Status: DC

## 2012-07-11 MED ORDER — FLUCONAZOLE 150 MG PO TABS
150.0000 mg | ORAL_TABLET | Freq: Once | ORAL | Status: AC
  Administered 2012-07-11: 150 mg via ORAL
  Filled 2012-07-11: qty 1

## 2012-07-11 MED ORDER — METRONIDAZOLE 500 MG PO TABS
2000.0000 mg | ORAL_TABLET | Freq: Once | ORAL | Status: AC
  Administered 2012-07-11: 2000 mg via ORAL
  Filled 2012-07-11: qty 4

## 2012-07-11 MED ORDER — TRAMADOL HCL 50 MG PO TABS
50.0000 mg | ORAL_TABLET | Freq: Once | ORAL | Status: AC
  Administered 2012-07-11: 50 mg via ORAL
  Filled 2012-07-11: qty 1

## 2012-07-12 LAB — GC/CHLAMYDIA PROBE AMP: CT Probe RNA: NEGATIVE

## 2012-07-16 ENCOUNTER — Encounter (HOSPITAL_COMMUNITY): Payer: Self-pay | Admitting: *Deleted

## 2012-07-16 ENCOUNTER — Inpatient Hospital Stay (HOSPITAL_COMMUNITY)
Admission: AD | Admit: 2012-07-16 | Discharge: 2012-07-16 | Disposition: A | Discharge status: Home / Self Care | Payer: Medicaid Other | Source: Ambulatory Visit | Attending: Obstetrics & Gynecology | Admitting: Obstetrics & Gynecology

## 2012-07-16 DIAGNOSIS — K5900 Constipation, unspecified: Secondary | ICD-10-CM | POA: Insufficient documentation

## 2012-07-16 DIAGNOSIS — O99611 Diseases of the digestive system complicating pregnancy, first trimester: Secondary | ICD-10-CM

## 2012-07-16 DIAGNOSIS — O26899 Other specified pregnancy related conditions, unspecified trimester: Secondary | ICD-10-CM

## 2012-07-16 DIAGNOSIS — R109 Unspecified abdominal pain: Secondary | ICD-10-CM | POA: Insufficient documentation

## 2012-07-16 DIAGNOSIS — O99891 Other specified diseases and conditions complicating pregnancy: Secondary | ICD-10-CM | POA: Insufficient documentation

## 2012-07-16 LAB — URINALYSIS, ROUTINE W REFLEX MICROSCOPIC
Bilirubin Urine: NEGATIVE
Hgb urine dipstick: NEGATIVE
Specific Gravity, Urine: 1.02 (ref 1.005–1.030)
Urobilinogen, UA: 0.2 mg/dL (ref 0.0–1.0)
pH: 6.5 (ref 5.0–8.0)

## 2012-07-16 MED ORDER — POLYETHYLENE GLYCOL 3350 17 G PO PACK: 17.0000 g | PACK | Freq: Every day | ORAL | Status: DC | PRN

## 2012-07-16 MED ORDER — ACETAMINOPHEN 500 MG PO TABS
1000.0000 mg | ORAL_TABLET | ORAL | Status: AC
  Administered 2012-07-16: 1000 mg via ORAL
  Filled 2012-07-16: qty 2

## 2012-07-16 MED ORDER — DOCUSATE SODIUM 100 MG PO CAPS: 100.0000 mg | ORAL_CAPSULE | Freq: Two times a day (BID) | ORAL | Status: DC

## 2012-07-16 NOTE — MAU Note (Signed)
Cramping in lower abd, had it the other day, has gotten worse.

## 2012-07-16 NOTE — MAU Provider Note (Signed)
Chief Complaint: Abdominal Pain   First Provider Initiated Contact with Patient 07/16/12 1544     SUBJECTIVE HPI: [name] is a 22 y.o. G1P0 at [redacted]w[redacted]d by LMP who presents to maternity admissions reporting intermittent sharp abdominal pain in her lower abdomen x3-4 days, worsening today.  She reports she has not eaten since yesterday because she has not been hungry and says she has not had a bowel movement since sometime last month.  She has also not had much fluid to drink today.  She has appointments scheduled to begin prenatal care in [facility].  She was treated for trichomonas on 07/11/12 and denies vaginal symptoms since treatment.  She denies vaginal bleeding, vaginal itching/burning, urinary symptoms, h/a, dizziness, n/v, or fever/chills.    She is tearful in MAU but when questioned by the CNM and RN, reports mild pain and denies other problems/discomforts.  She indicates anxiety about pregnancy but denies any other reasons for this besides her mild abdominal pain.  She denies drug use or physical abuse.   She had ultrasound and labs in MAU on 6/20 and was diagnosed with IUP with FHR.   Past Medical History  Diagnosis Date  . Asthma   . Seizures     still has them, not on meds, last at age 11   Past Surgical History  Procedure Laterality Date  . No past surgeries     History   Social History  . Marital Status: Single    Spouse Name: N/A    Number of Children: N/A  . Years of Education: N/A   Occupational History  . Not on file.   Social History Main Topics  . Smoking status: Former Smoker -- 0.25 packs/day    Types: Cigarettes  . Smokeless tobacco: Never Used     Comment: quit with positive preg  . Alcohol Use: Yes     Comment: rare  . Drug Use: Yes    Special: Marijuana     Comment: last was a couple days, not now that she is preg  . Sexually Active: Yes    Birth Control/ Protection: None   Other Topics Concern  . Not on file   Social History Narrative  .  No narrative on file   No current facility-administered medications on file prior to encounter.   No current outpatient prescriptions on file prior to encounter.   No Known Allergies  ROS: Pertinent items in HPI  OBJECTIVE Blood pressure 115/59, pulse 64, temperature 98.3 F (36.8 C), temperature source Oral, resp. rate 16, SpO2 100.00%. GENERAL: Well-developed, well-nourished female in no acute distress.  HEENT: Normocephalic HEART: normal rate RESP: normal effort ABDOMEN: Soft, non-tender EXTREMITIES: Nontender, no edema NEURO: Alert and oriented Pelvic exam: Deferred  LAB RESULTS Results for orders placed during the hospital encounter of 07/16/12 (from the past 24 hour(s))  URINALYSIS, ROUTINE W REFLEX MICROSCOPIC     Status: None   Collection Time    07/16/12 12:50 PM      Result Value Range   Color, Urine YELLOW  YELLOW   APPearance CLEAR  CLEAR   Specific Gravity, Urine 1.020  1.005 - 1.030   pH 6.5  5.0 - 8.0   Glucose, UA NEGATIVE  NEGATIVE mg/dL   Hgb urine dipstick NEGATIVE  NEGATIVE   Bilirubin Urine NEGATIVE  NEGATIVE   Ketones, ur NEGATIVE  NEGATIVE mg/dL   Protein, ur NEGATIVE  NEGATIVE mg/dL   Urobilinogen, UA 0.2  0.0 - 1.0 mg/dL  Nitrite NEGATIVE  NEGATIVE   Leukocytes, UA NEGATIVE  NEGATIVE     ASSESSMENT 1. Constipation in pregnancy in first trimester   2. Abdominal pain in pregnancy     PLAN Pt given  Tylenol 1000 mg and food and water in MAU with relief of symptoms Discharge home Colace and Miralax prescribed and pt given teaching about dietary changes Pt to keep appointment in [facility] in 2 weeks Return to MAU as needed    Medication List    TAKE these medications       docusate sodium 100 MG capsule  Commonly known as:  COLACE  Take 1 capsule (100 mg total) by mouth 2 (two) times daily.     polyethylene glycol packet  Commonly known as:  MIRALAX / GLYCOLAX  Take 17 g by mouth daily as needed.     prenatal multivitamin Tabs  Take  1 tablet by mouth daily at 12 noon.           Follow-up Information   Follow up with [facility]. (Keep scheduled OB appointment.  Return to MAU as needed.)    Contact information:   [address] [place] [address] [phone]      Sharen Counter Certified Nurse-Midwife 07/16/2012  3:59 PM

## 2012-07-16 NOTE — MAU Note (Signed)
Patient states she started having lower abdominal pain last night and continues today. Denies bleeding or discharge.

## 2012-07-17 NOTE — MAU Provider Note (Signed)
Attestation of Attending Supervision of Advanced Practitioner (CNM/NP): Evaluation and management procedures were performed by the Advanced Practitioner under my supervision and collaboration.  I have reviewed the Advanced Practitioner's note and chart, and I agree with the management and plan.  [name] 07/17/2012 10:09 AM

## 2012-07-21 ENCOUNTER — Ambulatory Visit (HOSPITAL_COMMUNITY)
Admission: RE | Admit: 2012-07-21 | Discharge: 2012-07-21 | Disposition: A | Discharge status: Home / Self Care | Payer: Medicaid Other | Source: Ambulatory Visit | Attending: Advanced Practice Midwife | Admitting: Advanced Practice Midwife

## 2012-07-21 DIAGNOSIS — Z3401 Encounter for supervision of normal first pregnancy, first trimester: Secondary | ICD-10-CM

## 2012-07-21 DIAGNOSIS — Z3689 Encounter for other specified antenatal screening: Secondary | ICD-10-CM | POA: Insufficient documentation

## 2012-07-21 DIAGNOSIS — O3680X0 Pregnancy with inconclusive fetal viability, not applicable or unspecified: Secondary | ICD-10-CM | POA: Insufficient documentation

## 2012-07-29 ENCOUNTER — Encounter (HOSPITAL_COMMUNITY): Payer: Self-pay | Admitting: Advanced Practice Midwife

## 2012-07-31 ENCOUNTER — Encounter: Payer: Medicaid Other | Admitting: Obstetrics and Gynecology

## 2012-08-05 ENCOUNTER — Emergency Department (HOSPITAL_COMMUNITY)
Admission: EM | Admit: 2012-08-05 | Discharge: 2012-08-05 | Discharge status: Against Medical Advice | Payer: Medicaid Other | Attending: Emergency Medicine | Admitting: Emergency Medicine

## 2012-08-05 ENCOUNTER — Encounter (HOSPITAL_COMMUNITY): Payer: Self-pay | Admitting: *Deleted

## 2012-08-05 DIAGNOSIS — Z87891 Personal history of nicotine dependence: Secondary | ICD-10-CM | POA: Insufficient documentation

## 2012-08-05 DIAGNOSIS — O219 Vomiting of pregnancy, unspecified: Secondary | ICD-10-CM | POA: Insufficient documentation

## 2012-08-05 DIAGNOSIS — J45909 Unspecified asthma, uncomplicated: Secondary | ICD-10-CM | POA: Insufficient documentation

## 2012-08-05 LAB — URINALYSIS, ROUTINE W REFLEX MICROSCOPIC
Glucose, UA: NEGATIVE mg/dL
Nitrite: NEGATIVE
Protein, ur: 100 mg/dL — AB
Specific Gravity, Urine: 1.023 (ref 1.005–1.030)
Urobilinogen, UA: 1 mg/dL (ref 0.0–1.0)

## 2012-08-05 LAB — URINE MICROSCOPIC-ADD ON

## 2012-08-05 NOTE — ED Notes (Signed)
Pt has been called 3 times with no answer.  

## 2012-08-05 NOTE — ED Notes (Signed)
Per EMS pt from home with c/o nausea/vomiting. Pt is [redacted] weeks pregnant. States very anxious on EMS arrival. VSS.

## 2012-08-06 LAB — URINE CULTURE

## 2012-08-28 ENCOUNTER — Inpatient Hospital Stay (HOSPITAL_COMMUNITY)
Admission: AD | Admit: 2012-08-28 | Discharge: 2012-08-28 | Discharge status: Against Medical Advice | Payer: Medicaid Other | Source: Ambulatory Visit | Attending: Family Medicine | Admitting: Family Medicine

## 2012-08-29 ENCOUNTER — Inpatient Hospital Stay (HOSPITAL_COMMUNITY)
Admission: AD | Admit: 2012-08-29 | Discharge: 2012-08-29 | Disposition: A | Discharge status: Home / Self Care | Payer: Medicaid Other | Source: Ambulatory Visit | Attending: Obstetrics and Gynecology | Admitting: Obstetrics and Gynecology

## 2012-08-29 ENCOUNTER — Encounter (HOSPITAL_COMMUNITY): Payer: Self-pay | Admitting: *Deleted

## 2012-08-29 DIAGNOSIS — N766 Ulceration of vulva: Secondary | ICD-10-CM | POA: Insufficient documentation

## 2012-08-29 DIAGNOSIS — L293 Anogenital pruritus, unspecified: Secondary | ICD-10-CM | POA: Insufficient documentation

## 2012-08-29 DIAGNOSIS — B373 Candidiasis of vulva and vagina: Secondary | ICD-10-CM

## 2012-08-29 DIAGNOSIS — B3731 Acute candidiasis of vulva and vagina: Secondary | ICD-10-CM | POA: Insufficient documentation

## 2012-08-29 DIAGNOSIS — IMO0002 Reserved for concepts with insufficient information to code with codable children: Secondary | ICD-10-CM

## 2012-08-29 DIAGNOSIS — B009 Herpesviral infection, unspecified: Secondary | ICD-10-CM

## 2012-08-29 DIAGNOSIS — O2390 Unspecified genitourinary tract infection in pregnancy, unspecified trimester: Secondary | ICD-10-CM | POA: Insufficient documentation

## 2012-08-29 LAB — URINALYSIS, ROUTINE W REFLEX MICROSCOPIC
Leukocytes, UA: NEGATIVE
Nitrite: NEGATIVE
Specific Gravity, Urine: 1.02 (ref 1.005–1.030)
pH: 7 (ref 5.0–8.0)

## 2012-08-29 LAB — WET PREP, GENITAL
Clue Cells Wet Prep HPF POC: NONE SEEN
Trich, Wet Prep: NONE SEEN

## 2012-08-29 MED ORDER — VALACYCLOVIR HCL 1 G PO TABS: 1000.0000 mg | ORAL_TABLET | Freq: Two times a day (BID) | ORAL | Status: AC

## 2012-08-29 MED ORDER — NYSTATIN-TRIAMCINOLONE 100000-0.1 UNIT/GM-% EX OINT: TOPICAL_OINTMENT | Freq: Two times a day (BID) | CUTANEOUS | Status: DC

## 2012-08-29 NOTE — MAU Note (Signed)
Pt noted four scratches on her perineum, has been there the last 4-5 days.  Has been itching & scratching.  Denies any STD's.  No vaginal discharge, notes bleeding when scratching.

## 2012-08-29 NOTE — MAU Provider Note (Signed)
History     CSN: [id number]  Arrival date and time: 08/29/12 7846   None     Chief Complaint  Patient presents with  . Vaginal Itching   HPI [name] is 22 y.o. G1P0 [redacted]w[redacted]d weeks presenting with vulvar itching and burning X 2 weeks and report of scratching herself.  She also itches in the groin, buttocks  X 3-4 days. Sxs are worse at night.   I "scratch so hard I take the skin off".  Denies UTI sxs, vaginal discharge or bleeding, changes in bowel movements.  No intercourse X 2 weeks.  Denies change of soaps or detergents.     Past Medical History  Diagnosis Date  . Asthma   . Seizures     still has them, not on meds, last at age 29    Past Surgical History  Procedure Laterality Date  . No past surgeries      Family History  Problem Relation Age of Onset  . Diabetes Maternal Grandmother   . Kidney disease Maternal Grandmother     dialysis    History  Substance Use Topics  . Smoking status: Former Smoker -- 0.25 packs/day    Types: Cigarettes  . Smokeless tobacco: Never Used     Comment: quit with positive preg  . Alcohol Use: No     Comment: rare    Allergies: No Known Allergies  Prescriptions prior to admission  Medication Sig Dispense Refill  . Prenatal Vit-Fe Fumarate-FA (PRENATAL MULTIVITAMIN) TABS Take 1 tablet by mouth daily at 12 noon.        Review of Systems  Constitutional: Negative for fever and chills.  Gastrointestinal: Positive for nausea. Negative for diarrhea, constipation and blood in stool.       Nausea related to pregnancy.  Genitourinary: Negative for dysuria (Pain when urine touches rash.), urgency, frequency and hematuria.  Skin: Positive for itching and rash.   Physical Exam   Blood pressure 116/48, pulse 74, temperature 98.2 F (36.8 C), temperature source Oral, resp. rate 18, weight 118 lb (53.524 kg), SpO2 100.00%.  Physical Exam  Constitutional: She is oriented to person, place, and time. She appears well-developed  and well-nourished.  Cardiovascular: Normal rate.   Respiratory: Effort normal.  GI: Soft. There is no tenderness.  Genitourinary:    There is rash (white macular plaques on vulva bilaterally), tenderness and lesion (4 ulcerative, raised, erythematous, friable lesions) on the right labia. There is rash on the left labia. Uterus is enlarged. Uterus is not tender. Cervix exhibits no motion tenderness, no discharge and no friability. No tenderness or bleeding around the vagina. Vaginal discharge (moderate amount of white creamy discharge) found.  Lymphadenopathy:       Right: Inguinal (single palpable nodule) adenopathy present.       Left: No inguinal adenopathy present.  Neurological: She is alert and oriented to person, place, and time.  Skin: Skin is warm and dry.  Psychiatric: Her behavior is normal.  FHR 165.  Results for orders placed during the hospital encounter of 08/29/12 (from the past 24 hour(s))  URINALYSIS, ROUTINE W REFLEX MICROSCOPIC     Status: None   Collection Time    08/29/12 10:06 AM      Result Value Range   Color, Urine YELLOW  YELLOW   APPearance CLEAR  CLEAR   Specific Gravity, Urine 1.020  1.005 - 1.030   pH 7.0  5.0 - 8.0   Glucose, UA NEGATIVE  NEGATIVE mg/dL  Hgb urine dipstick NEGATIVE  NEGATIVE   Bilirubin Urine NEGATIVE  NEGATIVE   Ketones, ur NEGATIVE  NEGATIVE mg/dL   Protein, ur NEGATIVE  NEGATIVE mg/dL   Urobilinogen, UA 0.2  0.0 - 1.0 mg/dL   Nitrite NEGATIVE  NEGATIVE   Leukocytes, UA NEGATIVE  NEGATIVE  WET PREP, GENITAL     Status: Abnormal   Collection Time    08/29/12 10:40 AM      Result Value Range   Yeast Wet Prep HPF POC NONE SEEN  NONE SEEN   Trich, Wet Prep NONE SEEN  NONE SEEN   Clue Cells Wet Prep HPF POC NONE SEEN  NONE SEEN   WBC, Wet Prep HPF POC FEW (*) NONE SEEN   MAU Course  Procedures GC/Chlamydia and HSV cultures to lab.  MDM Discussed patient HPI, physical findings and plan of care with Dr. Maximiano Coss treat  with Valtrex, warm baths.  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     Vulvar yeast-clinically    [redacted]w[redacted]d gestation  P: Rx for Valtrex 1gm BID x 10 days     Rx for mycolog creme for external use to pharmancy     Tylenol for discomfort     Warm baths to relieve discomfort     Keep scheduled appointment with clinic to begin prenatal care.  Yan Pankratz,EVE M 08/29/2012, 11:38 AM

## 2012-09-01 LAB — HERPES SIMPLEX VIRUS CULTURE

## 2012-09-01 NOTE — MAU Provider Note (Signed)
Attestation of Attending Supervision of Advanced Practitioner (CNM/NP): Evaluation and management procedures were performed by the Advanced Practitioner under my supervision and collaboration.  I have reviewed the Advanced Practitioner's note and chart, and I agree with the management and plan.  Toshiyuki Fredell 09/01/2012 11:18 AM   

## 2012-09-04 ENCOUNTER — Encounter: Payer: Self-pay | Admitting: Obstetrics

## 2012-09-05 ENCOUNTER — Encounter: Payer: Self-pay | Admitting: Obstetrics

## 2012-09-19 LAB — OB RESULTS CONSOLE GC/CHLAMYDIA
Chlamydia: NEGATIVE
Gonorrhea: NEGATIVE

## 2012-09-19 LAB — OB RESULTS CONSOLE ANTIBODY SCREEN: ANTIBODY SCREEN: NEGATIVE

## 2012-09-19 LAB — OB RESULTS CONSOLE HEPATITIS B SURFACE ANTIGEN: Hepatitis B Surface Ag: NEGATIVE

## 2012-09-19 LAB — OB RESULTS CONSOLE GBS: STREP GROUP B AG: NEGATIVE

## 2012-09-19 LAB — OB RESULTS CONSOLE RPR: RPR: NONREACTIVE

## 2012-09-19 LAB — OB RESULTS CONSOLE RUBELLA ANTIBODY, IGM: RUBELLA: IMMUNE

## 2012-09-19 LAB — OB RESULTS CONSOLE HIV ANTIBODY (ROUTINE TESTING): HIV: NONREACTIVE

## 2012-10-24 ENCOUNTER — Inpatient Hospital Stay (HOSPITAL_COMMUNITY)
Admission: AD | Admit: 2012-10-24 | Discharge: 2012-10-25 | Disposition: A | Discharge status: Home / Self Care | Payer: Medicaid Other | Source: Ambulatory Visit | Attending: Obstetrics and Gynecology | Admitting: Obstetrics and Gynecology

## 2012-10-24 ENCOUNTER — Encounter (HOSPITAL_COMMUNITY): Payer: Self-pay | Admitting: *Deleted

## 2012-10-24 DIAGNOSIS — F819 Developmental disorder of scholastic skills, unspecified: Secondary | ICD-10-CM | POA: Diagnosis present

## 2012-10-24 DIAGNOSIS — R350 Frequency of micturition: Secondary | ICD-10-CM | POA: Insufficient documentation

## 2012-10-24 DIAGNOSIS — G40909 Epilepsy, unspecified, not intractable, without status epilepticus: Secondary | ICD-10-CM | POA: Diagnosis not present

## 2012-10-24 DIAGNOSIS — A6000 Herpesviral infection of urogenital system, unspecified: Secondary | ICD-10-CM | POA: Insufficient documentation

## 2012-10-24 DIAGNOSIS — B009 Herpesviral infection, unspecified: Secondary | ICD-10-CM | POA: Diagnosis present

## 2012-10-24 DIAGNOSIS — R109 Unspecified abdominal pain: Secondary | ICD-10-CM | POA: Insufficient documentation

## 2012-10-24 DIAGNOSIS — B3731 Acute candidiasis of vulva and vagina: Secondary | ICD-10-CM | POA: Insufficient documentation

## 2012-10-24 DIAGNOSIS — B373 Candidiasis of vulva and vagina: Secondary | ICD-10-CM | POA: Insufficient documentation

## 2012-10-24 DIAGNOSIS — A599 Trichomoniasis, unspecified: Secondary | ICD-10-CM | POA: Diagnosis present

## 2012-10-24 DIAGNOSIS — J45909 Unspecified asthma, uncomplicated: Secondary | ICD-10-CM | POA: Diagnosis not present

## 2012-10-24 DIAGNOSIS — D649 Anemia, unspecified: Secondary | ICD-10-CM | POA: Diagnosis not present

## 2012-10-24 DIAGNOSIS — O0930 Supervision of pregnancy with insufficient antenatal care, unspecified trimester: Secondary | ICD-10-CM

## 2012-10-24 DIAGNOSIS — O98519 Other viral diseases complicating pregnancy, unspecified trimester: Secondary | ICD-10-CM | POA: Insufficient documentation

## 2012-10-24 DIAGNOSIS — O98819 Other maternal infectious and parasitic diseases complicating pregnancy, unspecified trimester: Secondary | ICD-10-CM | POA: Insufficient documentation

## 2012-10-24 DIAGNOSIS — O2390 Unspecified genitourinary tract infection in pregnancy, unspecified trimester: Secondary | ICD-10-CM | POA: Insufficient documentation

## 2012-10-24 DIAGNOSIS — A5901 Trichomonal vulvovaginitis: Secondary | ICD-10-CM | POA: Insufficient documentation

## 2012-10-25 DIAGNOSIS — A599 Trichomoniasis, unspecified: Secondary | ICD-10-CM | POA: Diagnosis present

## 2012-10-25 DIAGNOSIS — J45909 Unspecified asthma, uncomplicated: Secondary | ICD-10-CM | POA: Diagnosis not present

## 2012-10-25 DIAGNOSIS — F819 Developmental disorder of scholastic skills, unspecified: Secondary | ICD-10-CM | POA: Diagnosis present

## 2012-10-25 DIAGNOSIS — O0930 Supervision of pregnancy with insufficient antenatal care, unspecified trimester: Secondary | ICD-10-CM

## 2012-10-25 DIAGNOSIS — G40909 Epilepsy, unspecified, not intractable, without status epilepticus: Secondary | ICD-10-CM | POA: Diagnosis not present

## 2012-10-25 DIAGNOSIS — D649 Anemia, unspecified: Secondary | ICD-10-CM | POA: Diagnosis not present

## 2012-10-25 DIAGNOSIS — B009 Herpesviral infection, unspecified: Secondary | ICD-10-CM | POA: Diagnosis present

## 2012-10-25 LAB — URINE MICROSCOPIC-ADD ON

## 2012-10-25 LAB — WET PREP, GENITAL

## 2012-10-25 LAB — URINALYSIS, ROUTINE W REFLEX MICROSCOPIC
Glucose, UA: NEGATIVE mg/dL
Hgb urine dipstick: NEGATIVE
Ketones, ur: NEGATIVE mg/dL
Protein, ur: NEGATIVE mg/dL

## 2012-10-25 LAB — GC/CHLAMYDIA PROBE AMP: CT Probe RNA: NEGATIVE

## 2012-10-25 MED ORDER — METRONIDAZOLE 500 MG PO TABS
2000.0000 mg | ORAL_TABLET | Freq: Once | ORAL | Status: AC
  Administered 2012-10-25: 2000 mg via ORAL
  Filled 2012-10-25: qty 4

## 2012-10-25 MED ORDER — LIDOCAINE HCL 2 % EX GEL
Freq: Once | CUTANEOUS | Status: AC
  Administered 2012-10-25: 5 via TOPICAL
  Filled 2012-10-25: qty 20

## 2012-10-25 MED ORDER — IBUPROFEN 100 MG/5ML PO SUSP
600.0000 mg | Freq: Four times a day (QID) | ORAL | Status: DC | PRN
  Administered 2012-10-25: 600 mg via ORAL
  Filled 2012-10-25: qty 30

## 2012-10-25 MED ORDER — VALACYCLOVIR HCL 500 MG PO TABS
500.0000 mg | ORAL_TABLET | Freq: Once | ORAL | Status: AC
  Administered 2012-10-25: 500 mg via ORAL
  Filled 2012-10-25: qty 1

## 2012-10-25 MED ORDER — IBUPROFEN 100 MG/5ML PO SUSP: 600.0000 mg | Freq: Four times a day (QID) | ORAL | Status: DC | PRN

## 2012-10-25 MED ORDER — TERCONAZOLE 0.4 % VA CREA: 1.0000 | TOPICAL_CREAM | Freq: Every day | VAGINAL | Status: DC

## 2012-10-25 NOTE — Progress Notes (Signed)
Written and verbal d/c instructions given and understanding voiced. 

## 2012-10-25 NOTE — Progress Notes (Signed)
Manfred Arch CNM notified of pt's admission and status. Will see pt

## 2012-10-25 NOTE — MAU Provider Note (Signed)
History   22 yo G1P0 at 35 5/7 weeks presented unannounced c/o burning with urination, sharp pain around umbilicus, and ? Yeast infections.  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  Patient accompanied tonight by female cousin, who was with her at her office visit this week.  Patient reports she is staying with her sister now.  Patient denies leaking, bleeding, uterine cramping, N/V, or any other sx.  Voided on arrival, but discarded urine.  Reports difficulty swallowing pills.  Patient Active Problem List   Diagnosis Date Noted  . Late prenatal care 10/25/2012  . 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 Anemia 10/25/2012  . ? 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 Seizure disorder 10/25/2012  . Asthma, chronic 10/25/2012     Chief Complaint  Patient presents with  . Vaginal Pain     OB History   Grav Para Term Preterm Abortions TAB SAB Ect Mult Living   1               Past Medical History  Diagnosis Date  . Asthma   . Seizures     still has them, not on meds, last at age 82    Past Surgical History  Procedure Laterality Date  . No past surgeries      Family History  Problem Relation Age of Onset  . Diabetes Maternal Grandmother   . Kidney disease Maternal Grandmother     dialysis    History  Substance Use Topics  . Smoking status: Former Smoker -- 0.25 packs/day    Types: Cigarettes  . Smokeless tobacco: Never Used     Comment: quit with positive preg  . Alcohol Use: No     Comment: rare    Allergies: No Known Allergies  Prescriptions prior to admission  Medication Sig Dispense Refill  . Prenatal Vit-Fe Fumarate-FA (PRENATAL MULTIVITAMIN) TABS Take 1 tablet by mouth daily at 12 noon.      . nystatin-triamcinolone ointment (MYCOLOG) Apply topically 2 (two) times daily.  30 g  0     Physical Exam    Blood pressure 127/74, pulse 81, temperature 98.1 F (36.7 C), resp. rate 20, height 5\' 7"  (1.702 m), weight 124 lb 8 oz (56.473 kg).  In NAD Chest clear Heart RRR without murmur Abd gravid, NT, no rebound or guarding. Pelvic--1 cm ulcerated lesion on right labia majora, tender to touch, appears c/w HSV.  Small amount white d/c in vault. Cervix closed and long to visual exam. Ext WNL  FHR 170 by doppler UCs--uterus soft, NT, no contractions palpated.  ED Course  IUP at 20 5/7 weeks HSV lesion Musculoskeletal discomfort Social/cognitive issues.  Plan: Wet prep GC, chlamydia UA if able. Valtrex 500 mg po now (was able to take without difficulty) Lidocaine 2% gel for application to labial HSV lesion. Motrin 600 mg po q 6 hours prn musculoskeletal pain Will refer patient to Franchot Erichsen, [facility], for assistance with social issues.   Nigel Bridgeman CNM, [place] 10/25/2012 1:02 AM   Addendum: Results for orders placed during the hospital encounter of 10/24/12 (from the past 24 hour(s))  WET PREP, GENITAL     Status: Abnormal   Collection Time    10/25/12 12:40 AM      Result Value Range   Yeast Wet Prep HPF POC MODERATE (*) NONE SEEN  Trich, Wet Prep MODERATE (*) NONE SEEN   Clue Cells Wet Prep HPF POC NONE SEEN  NONE SEEN   WBC, Wet Prep HPF POC MODERATE (*) NONE SEEN  URINALYSIS, ROUTINE W REFLEX MICROSCOPIC     Status: Abnormal   Collection Time    10/25/12  1:00 AM      Result Value Range   Color, Urine YELLOW  YELLOW   APPearance CLEAR  CLEAR   Specific Gravity, Urine <1.005 (*) 1.005 - 1.030   pH 7.0  5.0 - 8.0   Glucose, UA NEGATIVE  NEGATIVE mg/dL   Hgb urine dipstick NEGATIVE  NEGATIVE   Bilirubin Urine NEGATIVE  NEGATIVE   Ketones, ur NEGATIVE  NEGATIVE mg/dL   Protein, ur NEGATIVE  NEGATIVE mg/dL   Urobilinogen, UA 0.2  0.0 - 1.0 mg/dL   Nitrite NEGATIVE  NEGATIVE   Leukocytes, UA SMALL (*) NEGATIVE  URINE MICROSCOPIC-ADD ON     Status: Abnormal    Collection Time    10/25/12  1:00 AM      Result Value Range   Squamous Epithelial / LPF FEW (*) RARE   WBC, UA 3-6  <3 WBC/hpf   Bacteria, UA FEW (*) RARE   Urine to culture. GC, chlamydia pending.  Reports no pain now. Given 2 gm MTZ dose before d/c.  Impression: IUP at 20 5/7 weeks HSV lesion Trichomonas Yeast vaginitis Musculoskeletal discomforts  Plan: D/C home. Rx MTZ 2 gm dose for patient's partner, Eloise Levels, with information on trichomonas (written Rx) Rx Ibuprophen 600 mg po q 6 hours prn musculoskeletal discomfort--sent to patient's pharmacy. Rx Terazol 7 cream--sent to patient's pharmacy. Encouraged patient to pick up Valtrex Rx from pharmacy and complete course of treatment for current HSV lesion. Keep scheduled appt at [facility] on 11/19/12 at 10:45pm. Note to [facility] office for referral to [facility], [facility], next week.  Nigel Bridgeman, CNM 10/25/12 2am.

## 2012-10-25 NOTE — MAU Note (Signed)
Was seen at [facility] and told I have herpes. My mom kicked me out and would not get my medicine. Having some pain R groin and think I have a yeast infection. Having vag itching with white d/c.

## 2012-10-26 LAB — URINE CULTURE: Colony Count: 10000

## 2013-01-22 NOTE — L&D Delivery Note (Signed)
Delivery Note At 12:37 PM a viable female was delivered via Vaginal, Spontaneous Delivery (Presentation: Right Occiput Anterior).  No nuchal cord.  No difficulty with shoulders.  Infant vigorous with spontaneous cry.  Dried, stimulated and placed on mat abd.  Cord doubly clamped and cut by FOB.   APGAR: 8, 9; weight 6 lb 15.5 oz (3161 g).   Placenta status: Intact, Spontaneous.  Cord: 3 vessels with the following complications: Short and lateral insertion-to path.  Cord pH: N/A  Anesthesia: None  Episiotomy: None Lacerations: 1st degree labial-hemostatic, not repaired Suture Repair: N/A Est. Blood Loss (mL): 450  Mom to postpartum.  Baby to Couplet care / Skin to Skin.  [name] 02/28/2013, 7:10 PM

## 2013-01-22 NOTE — L&D Delivery Note (Deleted)
Delivery Note At 12:37 PM a viable female was delivered via Vaginal, Spontaneous Delivery (Presentation: Right Occiput Anterior).  No nuchal cord.  No difficulty with shoulders.  Infant vigorour with spontaneous cry.  Bulb suctioned and placed on mat abd.  Infant dried and stimulated.  APGAR: 8,9 ; weight 6# 15 .   Placenta status: Intact, Spontaneous.  Cord: 2 vessels with the following complications: Short.  Cord pH: N/A Placenta to path due to meconium staining and lateral cord insertion.  Immediate uterine atony after delivery which resolved with fundal massage and 1000mcg Cytotec.    Anesthesia: None  Episiotomy: None Lacerations: 1st degree rt labial - hemostatic, no repair Suture Repair: N/A Est. Blood Loss (mL): 450  Mom to postpartum.  Baby to Couplet care / Skin to Skin.  Doris Cappella O. 02/28/2013, 1:17 PM

## 2013-02-26 ENCOUNTER — Inpatient Hospital Stay (HOSPITAL_COMMUNITY)
Admission: AD | Admit: 2013-02-26 | Discharge: 2013-02-26 | Disposition: A | Discharge status: Home / Self Care | Payer: Medicaid Other | Source: Ambulatory Visit | Attending: Obstetrics and Gynecology | Admitting: Obstetrics and Gynecology

## 2013-02-26 ENCOUNTER — Encounter (HOSPITAL_COMMUNITY): Payer: Self-pay | Admitting: *Deleted

## 2013-02-26 DIAGNOSIS — O9989 Other specified diseases and conditions complicating pregnancy, childbirth and the puerperium: Secondary | ICD-10-CM

## 2013-02-26 DIAGNOSIS — O99891 Other specified diseases and conditions complicating pregnancy: Secondary | ICD-10-CM | POA: Insufficient documentation

## 2013-02-26 DIAGNOSIS — O99350 Diseases of the nervous system complicating pregnancy, unspecified trimester: Secondary | ICD-10-CM

## 2013-02-26 DIAGNOSIS — G40909 Epilepsy, unspecified, not intractable, without status epilepticus: Secondary | ICD-10-CM | POA: Insufficient documentation

## 2013-02-26 DIAGNOSIS — J45909 Unspecified asthma, uncomplicated: Secondary | ICD-10-CM | POA: Insufficient documentation

## 2013-02-26 DIAGNOSIS — O479 False labor, unspecified: Secondary | ICD-10-CM | POA: Insufficient documentation

## 2013-02-26 LAB — URINALYSIS, ROUTINE W REFLEX MICROSCOPIC
BILIRUBIN URINE: NEGATIVE
Glucose, UA: NEGATIVE mg/dL
KETONES UR: NEGATIVE mg/dL
LEUKOCYTES UA: NEGATIVE
NITRITE: NEGATIVE
Protein, ur: NEGATIVE mg/dL
UROBILINOGEN UA: 0.2 mg/dL (ref 0.0–1.0)
pH: 5.5 (ref 5.0–8.0)

## 2013-02-26 LAB — URINE MICROSCOPIC-ADD ON

## 2013-02-26 LAB — AMNISURE RUPTURE OF MEMBRANE (ROM) NOT AT ARMC: AMNISURE: NEGATIVE

## 2013-02-26 NOTE — Discharge Instructions (Signed)
Preeclampsia and Eclampsia °Preeclampsia is a condition of high blood pressure during pregnancy. It can happen at 20 weeks or later in pregnancy. If high blood pressure occurs in the second half of pregnancy with no other symptoms, it is called gestational hypertension and goes away after the baby is born. If any of the symptoms listed below develop with gestational hypertension, it is then called preeclampsia. Eclampsia (convulsions) may follow preeclampsia. This is one of the reasons for regular prenatal checkups. Early diagnosis and treatment are very important to prevent eclampsia. °CAUSES  °There is no known cause of preeclampsia/eclampsia in pregnancy. There are several known conditions that may put the pregnant woman at risk, such as: °· The first pregnancy. °· Having preeclampsia in a past pregnancy. °· Having lasting (chronic) high blood pressure. °· Having multiples (twins, triplets). °· Being age 35 or older. °· African American ethnic background. °· Having kidney disease or diabetes. °· Medical conditions such as lupus or blood diseases. °· Being overweight (obese). °SYMPTOMS  °· High blood pressure. °· Headaches. °· Sudden weight gain. °· Swelling of hands, face, legs, and feet. °· Protein in the urine. °· Feeling sick to your stomach (nauseous) and throwing up (vomiting). °· Vision problems (blurred or double vision). °· Numbness in the face, arms, legs, and feet. °· Dizziness. °· Slurred speech. °· Preeclampsia can cause growth retardation in the fetus. °· Separation (abruption) of the placenta. °· Not enough fluid in the amniotic sac (oligohydramnios). °· Sensitivity to bright lights. °· Belly (abdominal) pain. °DIAGNOSIS  °If protein is found in the urine in the second half of pregnancy, this is considered preeclampsia. Other symptoms mentioned above may also be present. °TREATMENT  °It is necessary to treat this. °· Your caregiver may prescribe bed rest early in this condition. Plenty of rest and  salt restriction may be all that is needed. °· Medicines may be necessary to lower blood pressure if the condition does not respond to more conservative measures. °· In more severe cases, hospitalization may be needed: °· For treatment of blood pressure. °· To control fluid retention. °· To monitor the baby to see if the condition is causing harm to the baby. °· Hospitalization is the best way to treat the first sign of preeclampsia. This is so the mother and baby can be watched closely and blood tests can be done effectively and correctly. °· If the condition becomes severe, it may be necessary to induce labor or to remove the infant by surgical means (cesarean section). The best cure for preeclampsia/eclampsia is to deliver the baby. °Preeclampsia and eclampsia involve risks to mother and infant. Your caregiver will discuss these risks with you. Together, you can work out the best possible approach to your problems. Make sure you keep your prenatal visits as scheduled. Not keeping appointments could result in a chronic or permanent injury, pain, disability to you, and death or injury to you or your unborn baby. If there is any problem keeping the appointment, you must call to reschedule. °HOME CARE INSTRUCTIONS  °· Keep your prenatal appointments and tests as scheduled. °· Tell your caregiver if you have any of the above risk factors. °· Get plenty of rest and sleep. °· Eat a balanced diet that is low in salt, and do not add salt to your food. °· Avoid stressful situations. °· Only take over-the-counter and prescriptions medicines for pain, discomfort, or fever as directed by your caregiver. °SEEK IMMEDIATE MEDICAL CARE IF:  °· You develop severe swelling   anywhere in the body. This usually occurs in the legs. °· You gain 05 lb/2.3 kg or more in a week. °· You develop a severe headache, dizziness, problems with your vision, or confusion. °· You have abdominal pain, nausea, or vomiting. °· You have a seizure. °· You  have trouble moving any part of your body, or you develop numbness or problems speaking. °· You have bruising or abnormal bleeding from anywhere in the body. °· You develop a stiff neck. °· You pass out. °MAKE SURE YOU:  °· Understand these instructions. °· Will watch your condition. °· Will get help right away if you are not doing well or get worse. °Document Released: 01/06/2000 Document Revised: 04/02/2011 Document Reviewed: 08/22/2007 °ExitCare® Patient Information ©2014 [employer]. ° °Fetal Movement Counts °Patient Name: __________________________________________________ Patient Due Date: ____________________ °Performing a fetal movement count is highly recommended in high-risk pregnancies, but it is good for every pregnant woman to do. Your caregiver may ask you to start counting fetal movements at 28 weeks of the pregnancy. Fetal movements often increase: °· After eating a full meal. °· After physical activity. °· After eating or drinking something sweet or cold. °· At rest. °Pay attention to when you feel the baby is most active. This will help you notice a pattern of your baby's sleep and wake cycles and what factors contribute to an increase in fetal movement. It is important to perform a fetal movement count at the same time each day when your baby is normally most active.  °HOW TO COUNT FETAL MOVEMENTS °1. Find a quiet and comfortable area to sit or lie down on your left side. Lying on your left side provides the best blood and oxygen circulation to your baby. °2. Write down the day and time on a sheet of paper or in a journal. °3. Start counting kicks, flutters, swishes, rolls, or jabs in a 2 hour period. You should feel at least 10 movements within 2 hours. °4. If you do not feel 10 movements in 2 hours, wait 2 3 hours and count again. Look for a change in the pattern or not enough counts in 2 hours. °SEEK MEDICAL CARE IF: °· You feel less than 10 counts in 2 hours, tried twice. °· There is no  movement in over an hour. °· The pattern is changing or taking longer each day to reach 10 counts in 2 hours. °· You feel the baby is not moving as he or she usually does. °Date: ____________ Movements: ____________ Start time: ____________ Finish time: ____________  °Date: ____________ Movements: ____________ Start time: ____________ Finish time: ____________ °Date: ____________ Movements: ____________ Start time: ____________ Finish time: ____________ °Date: ____________ Movements: ____________ Start time: ____________ Finish time: ____________ °Date: ____________ Movements: ____________ Start time: ____________ Finish time: ____________ °Date: ____________ Movements: ____________ Start time: ____________ Finish time: ____________ °Date: ____________ Movements: ____________ Start time: ____________ Finish time: ____________ °Date: ____________ Movements: ____________ Start time: ____________ Finish time: ____________  °Date: ____________ Movements: ____________ Start time: ____________ Finish time: ____________ °Date: ____________ Movements: ____________ Start time: ____________ Finish time: ____________ °Date: ____________ Movements: ____________ Start time: ____________ Finish time: ____________ °Date: ____________ Movements: ____________ Start time: ____________ Finish time: ____________ °Date: ____________ Movements: ____________ Start time: ____________ Finish time: ____________ °Date: ____________ Movements: ____________ Start time: ____________ Finish time: ____________ °Date: ____________ Movements: ____________ Start time: ____________ Finish time: ____________  °Date: ____________ Movements: ____________ Start time: ____________ Finish time: ____________ °Date: ____________ Movements: ____________ Start time: ____________ Finish time: ____________ °  Date: ____________ Movements: ____________ Start time: ____________ Finish time: ____________ °Date: ____________ Movements: ____________ Start time:  ____________ Finish time: ____________ °Date: ____________ Movements: ____________ Start time: ____________ Finish time: ____________ °Date: ____________ Movements: ____________ Start time: ____________ Finish time: ____________ °Date: ____________ Movements: ____________ Start time: ____________ Finish time: ____________  °Date: ____________ Movements: ____________ Start time: ____________ Finish time: ____________ °Date: ____________ Movements: ____________ Start time: ____________ Finish time: ____________ °Date: ____________ Movements: ____________ Start time: ____________ Finish time: ____________ °Date: ____________ Movements: ____________ Start time: ____________ Finish time: ____________ °Date: ____________ Movements: ____________ Start time: ____________ Finish time: ____________ °Date: ____________ Movements: ____________ Start time: ____________ Finish time: ____________ °Date: ____________ Movements: ____________ Start time: ____________ Finish time: ____________  °Date: ____________ Movements: ____________ Start time: ____________ Finish time: ____________ °Date: ____________ Movements: ____________ Start time: ____________ Finish time: ____________ °Date: ____________ Movements: ____________ Start time: ____________ Finish time: ____________ °Date: ____________ Movements: ____________ Start time: ____________ Finish time: ____________ °Date: ____________ Movements: ____________ Start time: ____________ Finish time: ____________ °Date: ____________ Movements: ____________ Start time: ____________ Finish time: ____________ °Date: ____________ Movements: ____________ Start time: ____________ Finish time: ____________  °Date: ____________ Movements: ____________ Start time: ____________ Finish time: ____________ °Date: ____________ Movements: ____________ Start time: ____________ Finish time: ____________ °Date: ____________ Movements: ____________ Start time: ____________ Finish time: ____________ °Date:  ____________ Movements: ____________ Start time: ____________ Finish time: ____________ °Date: ____________ Movements: ____________ Start time: ____________ Finish time: ____________ °Date: ____________ Movements: ____________ Start time: ____________ Finish time: ____________ °Date: ____________ Movements: ____________ Start time: ____________ Finish time: ____________  °Date: ____________ Movements: ____________ Start time: ____________ Finish time: ____________ °Date: ____________ Movements: ____________ Start time: ____________ Finish time: ____________ °Date: ____________ Movements: ____________ Start time: ____________ Finish time: ____________ °Date: ____________ Movements: ____________ Start time: ____________ Finish time: ____________ °Date: ____________ Movements: ____________ Start time: ____________ Finish time: ____________ °Date: ____________ Movements: ____________ Start time: ____________ Finish time: ____________ °Date: ____________ Movements: ____________ Start time: ____________ Finish time: ____________  °Date: ____________ Movements: ____________ Start time: ____________ Finish time: ____________ °Date: ____________ Movements: ____________ Start time: ____________ Finish time: ____________ °Date: ____________ Movements: ____________ Start time: ____________ Finish time: ____________ °Date: ____________ Movements: ____________ Start time: ____________ Finish time: ____________ °Date: ____________ Movements: ____________ Start time: ____________ Finish time: ____________ °Date: ____________ Movements: ____________ Start time: ____________ Finish time: ____________ °Document Released: 02/07/2006 Document Revised: 12/26/2011 Document Reviewed: 11/05/2011 °ExitCare® Patient Information ©2014 [employer]. ° ° °

## 2013-02-26 NOTE — MAU Provider Note (Signed)
DATE: 02/26/2013  Maternity Admissions Unit History and Physical Exam for an Obstetrics Patient  [name] is a 23 y.o. female, G1P0, at 3443w3d gestation, who presents for evaluation of leaking fluid. [facility]. The patient denies bleeding. She denies headaches, blurred vision, and right upper quadrant tenderness. See history below.  OB History   Grav Para Term Preterm Abortions TAB SAB Ect Mult Living   1               Past Medical History  Diagnosis Date  . Asthma   . Seizures     still has them, not on meds, last at age 23    Prescriptions prior to admission  Medication Sig Dispense Refill  . valACYclovir (VALTREX) 500 MG tablet Take 500 mg by mouth 2 (two) times daily.        Past Surgical History  Procedure Laterality Date  . No past surgeries      No Known Allergies  Family History: family history includes Diabetes in her maternal grandmother; Kidney disease in her maternal grandmother.  Social History:  reports that she has quit smoking. Her smoking use included Cigarettes. She smoked 0.25 packs per day. She has never used smokeless tobacco. She reports that she does not drink alcohol or use illicit drugs.  Review of systems: Normal pregnancy complaints.  Admission Physical Exam:  Dilation: Closed Effacement (%): 50 Station: -2 Exam by:: K.WIlson,RN There is no weight on file to calculate BMI.  Blood pressure 127/71, pulse 76, temperature 98.3 F (36.8 C), resp. rate 18, height 5\' 7"  (1.702 m).  HEENT:                 Within normal limits Chest:                   Clear Heart:                    Regular rate and rhythm Abdomen:             Gravid and nontender Extremities:          Grossly normal Neurologic exam: Grossly normal  NST: Category 1; Contractions: Mild.  Results for orders placed during the hospital encounter of 02/26/13 (from  the past 24 hour(s))  AMNISURE RUPTURE OF MEMBRANE (ROM)     Status: None   Collection Time    02/26/13 11:07 AM      Result Value Range   Amnisure ROM NEGATIVE    URINALYSIS, ROUTINE W REFLEX MICROSCOPIC     Status: Abnormal   Collection Time    02/26/13 11:11 AM      Result Value Range   Color, Urine YELLOW  YELLOW   APPearance CLEAR  CLEAR   Specific Gravity, Urine <1.005 (*) 1.005 - 1.030   pH 5.5  5.0 - 8.0   Glucose, UA NEGATIVE  NEGATIVE mg/dL   Hgb urine dipstick TRACE (*) NEGATIVE   Bilirubin Urine NEGATIVE  NEGATIVE   Ketones, ur NEGATIVE  NEGATIVE mg/dL   Protein, ur NEGATIVE  NEGATIVE mg/dL   Urobilinogen, UA 0.2  0.0 - 1.0 mg/dL   Nitrite NEGATIVE  NEGATIVE   Leukocytes, UA NEGATIVE  NEGATIVE  URINE MICROSCOPIC-ADD ON     Status: Abnormal   Collection Time    02/26/13 11:11 AM      Result Value Range   Squamous  Epithelial / LPF FEW (*) RARE   Bacteria, UA RARE  RARE   BP 127/71  Pulse 76  Temp(Src) 98.3 F (36.8 C)  Resp 18  Ht 5\' 7"  (1.702 m)    Assessment:  [redacted]w[redacted]d gestation  Leaking fluid but no rupture membranes  Transient increase in blood pressure  False labor  Plan:  Preeclampsia precautions discussed.  Return to office in one week or as necessary. She has an appointment that she can keep tomorrow if she would like.  Information given about fetal movement counts and preeclampsia.   Janine Limbo 02/26/2013, 12:51 PM

## 2013-02-26 NOTE — MAU Note (Signed)
Pt report she started noticing leaking last night when she went to bed. Woke up this morning still leaking some clear watery fluid. Good fetal movement denies any ctx.

## 2013-02-28 ENCOUNTER — Encounter (HOSPITAL_COMMUNITY): Payer: Self-pay | Admitting: *Deleted

## 2013-02-28 ENCOUNTER — Inpatient Hospital Stay (HOSPITAL_COMMUNITY)
Admission: AD | Admit: 2013-02-28 | Discharge: 2013-03-04 | DRG: 774 | Disposition: A | Discharge status: Home / Self Care | Payer: Medicaid Other | Source: Ambulatory Visit | Attending: Obstetrics and Gynecology | Admitting: Obstetrics and Gynecology

## 2013-02-28 DIAGNOSIS — O9903 Anemia complicating the puerperium: Secondary | ICD-10-CM | POA: Diagnosis not present

## 2013-02-28 DIAGNOSIS — D62 Acute posthemorrhagic anemia: Secondary | ICD-10-CM | POA: Diagnosis not present

## 2013-02-28 DIAGNOSIS — R7402 Elevation of levels of lactic acid dehydrogenase (LDH): Secondary | ICD-10-CM | POA: Diagnosis not present

## 2013-02-28 DIAGNOSIS — G40909 Epilepsy, unspecified, not intractable, without status epilepticus: Secondary | ICD-10-CM | POA: Diagnosis present

## 2013-02-28 DIAGNOSIS — J45909 Unspecified asthma, uncomplicated: Secondary | ICD-10-CM

## 2013-02-28 DIAGNOSIS — O904 Postpartum acute kidney failure: Secondary | ICD-10-CM | POA: Diagnosis not present

## 2013-02-28 DIAGNOSIS — A6000 Herpesviral infection of urogenital system, unspecified: Secondary | ICD-10-CM | POA: Diagnosis present

## 2013-02-28 DIAGNOSIS — O411090 Infection of amniotic sac and membranes, unspecified, unspecified trimester, not applicable or unspecified: Secondary | ICD-10-CM | POA: Diagnosis present

## 2013-02-28 DIAGNOSIS — O99354 Diseases of the nervous system complicating childbirth: Secondary | ICD-10-CM | POA: Diagnosis present

## 2013-02-28 DIAGNOSIS — D649 Anemia, unspecified: Secondary | ICD-10-CM

## 2013-02-28 DIAGNOSIS — IMO0002 Reserved for concepts with insufficient information to code with codable children: Secondary | ICD-10-CM | POA: Diagnosis present

## 2013-02-28 DIAGNOSIS — Z3490 Encounter for supervision of normal pregnancy, unspecified, unspecified trimester: Secondary | ICD-10-CM

## 2013-02-28 DIAGNOSIS — R7401 Elevation of levels of liver transaminase levels: Secondary | ICD-10-CM | POA: Diagnosis not present

## 2013-02-28 DIAGNOSIS — O98519 Other viral diseases complicating pregnancy, unspecified trimester: Secondary | ICD-10-CM | POA: Diagnosis present

## 2013-02-28 DIAGNOSIS — R740 Nonspecific elevation of levels of transaminase and lactic acid dehydrogenase [LDH]: Secondary | ICD-10-CM

## 2013-02-28 DIAGNOSIS — E876 Hypokalemia: Secondary | ICD-10-CM | POA: Diagnosis not present

## 2013-02-28 DIAGNOSIS — IMO0001 Reserved for inherently not codable concepts without codable children: Secondary | ICD-10-CM

## 2013-02-28 LAB — URINALYSIS, ROUTINE W REFLEX MICROSCOPIC
Bilirubin Urine: NEGATIVE
Glucose, UA: NEGATIVE mg/dL
Ketones, ur: NEGATIVE mg/dL
Leukocytes, UA: NEGATIVE
NITRITE: NEGATIVE
PH: 7 (ref 5.0–8.0)
Protein, ur: NEGATIVE mg/dL
Urobilinogen, UA: 0.2 mg/dL (ref 0.0–1.0)

## 2013-02-28 LAB — URINE MICROSCOPIC-ADD ON

## 2013-02-28 LAB — CBC
HCT: 27.8 % — ABNORMAL LOW (ref 36.0–46.0)
Hemoglobin: 9.3 g/dL — ABNORMAL LOW (ref 12.0–15.0)
MCH: 26.6 pg (ref 26.0–34.0)
MCHC: 33.5 g/dL (ref 30.0–36.0)
MCV: 79.4 fL (ref 78.0–100.0)
Platelets: 289 10*3/uL (ref 150–400)
RBC: 3.5 MIL/uL — ABNORMAL LOW (ref 3.87–5.11)
RDW: 15.9 % — AB (ref 11.5–15.5)
WBC: 10.7 10*3/uL — ABNORMAL HIGH (ref 4.0–10.5)

## 2013-02-28 LAB — COMPREHENSIVE METABOLIC PANEL
ALK PHOS: 220 U/L — AB (ref 39–117)
ALT: 39 U/L — AB (ref 0–35)
AST: 75 U/L — ABNORMAL HIGH (ref 0–37)
Albumin: 2.4 g/dL — ABNORMAL LOW (ref 3.5–5.2)
BUN: 8 mg/dL (ref 6–23)
CALCIUM: 7.8 mg/dL — AB (ref 8.4–10.5)
CO2: 21 meq/L (ref 19–32)
Chloride: 103 mEq/L (ref 96–112)
Creatinine, Ser: 1.57 mg/dL — ABNORMAL HIGH (ref 0.50–1.10)
GFR, EST AFRICAN AMERICAN: 53 mL/min — AB (ref >90)
GFR, EST NON AFRICAN AMERICAN: 46 mL/min — AB (ref >90)
Glucose, Bld: 99 mg/dL (ref 70–99)
Potassium: 2.8 mEq/L — CL (ref 3.7–5.3)
SODIUM: 139 meq/L (ref 137–147)
TOTAL PROTEIN: 6.7 g/dL (ref 6.0–8.3)
Total Bilirubin: 0.7 mg/dL (ref 0.3–1.2)

## 2013-02-28 LAB — RAPID URINE DRUG SCREEN, HOSP PERFORMED
Amphetamines: NOT DETECTED
Barbiturates: NOT DETECTED
Benzodiazepines: NOT DETECTED
COCAINE: NOT DETECTED
Opiates: NOT DETECTED
TETRAHYDROCANNABINOL: NOT DETECTED

## 2013-02-28 LAB — PROTEIN / CREATININE RATIO, URINE
Creatinine, Urine: 42.37 mg/dL
Protein Creatinine Ratio: 0.13 (ref 0.00–0.15)
Total Protein, Urine: 5.5 mg/dL

## 2013-02-28 LAB — URIC ACID: Uric Acid, Serum: 7.1 mg/dL — ABNORMAL HIGH (ref 2.4–7.0)

## 2013-02-28 LAB — MAGNESIUM: MAGNESIUM: 1.5 mg/dL (ref 1.5–2.5)

## 2013-02-28 LAB — LACTATE DEHYDROGENASE: LDH: 385 U/L — AB (ref 94–250)

## 2013-02-28 LAB — RPR: RPR Ser Ql: NONREACTIVE

## 2013-02-28 MED ORDER — OXYCODONE-ACETAMINOPHEN 5-325 MG PO TABS: 1.0000 | ORAL_TABLET | ORAL | Status: DC | PRN

## 2013-02-28 MED ORDER — OXYTOCIN 10 UNIT/ML IJ SOLN
INTRAMUSCULAR | Status: AC
  Filled 2013-02-28: qty 2

## 2013-02-28 MED ORDER — FENTANYL CITRATE 0.05 MG/ML IJ SOLN
INTRAMUSCULAR | Status: AC
  Filled 2013-02-28: qty 2

## 2013-02-28 MED ORDER — ONDANSETRON HCL 4 MG PO TABS: 4.0000 mg | ORAL_TABLET | ORAL | Status: DC | PRN

## 2013-02-28 MED ORDER — GENTAMICIN SULFATE 40 MG/ML IJ SOLN
1.5000 mg/kg | Freq: Three times a day (TID) | INTRAVENOUS | Status: DC
  Administered 2013-02-28 – 2013-03-01 (×4): 100 mg via INTRAVENOUS
  Filled 2013-02-28 (×5): qty 2.5

## 2013-02-28 MED ORDER — PRENATAL MULTIVITAMIN CH
1.0000 | ORAL_TABLET | Freq: Every day | ORAL | Status: DC
  Administered 2013-03-03 – 2013-03-04 (×2): 1 via ORAL
  Filled 2013-02-28 (×3): qty 1

## 2013-02-28 MED ORDER — LIDOCAINE HCL (PF) 1 % IJ SOLN
INTRAMUSCULAR | Status: AC
  Filled 2013-02-28: qty 30

## 2013-02-28 MED ORDER — BENZOCAINE-MENTHOL 20-0.5 % EX AERO
1.0000 "application " | INHALATION_SPRAY | CUTANEOUS | Status: DC | PRN
  Filled 2013-02-28: qty 56

## 2013-02-28 MED ORDER — SODIUM CHLORIDE 0.9 % IV SOLN
2.0000 g | Freq: Four times a day (QID) | INTRAVENOUS | Status: DC
  Administered 2013-02-28 – 2013-03-01 (×5): 2 g via INTRAVENOUS
  Filled 2013-02-28 (×6): qty 2000

## 2013-02-28 MED ORDER — POTASSIUM CHLORIDE CRYS ER 20 MEQ PO TBCR
40.0000 meq | EXTENDED_RELEASE_TABLET | Freq: Two times a day (BID) | ORAL | Status: DC
  Administered 2013-02-28 – 2013-03-02 (×5): 40 meq via ORAL
  Filled 2013-02-28 (×11): qty 2

## 2013-02-28 MED ORDER — OXYTOCIN BOLUS FROM INFUSION: 500.0000 mL | INTRAVENOUS | Status: DC

## 2013-02-28 MED ORDER — ACETAMINOPHEN 325 MG PO TABS
650.0000 mg | ORAL_TABLET | Freq: Four times a day (QID) | ORAL | Status: DC | PRN
  Administered 2013-02-28: 650 mg via ORAL
  Filled 2013-02-28: qty 2

## 2013-02-28 MED ORDER — DIBUCAINE 1 % RE OINT: 1.0000 "application " | TOPICAL_OINTMENT | RECTAL | Status: DC | PRN

## 2013-02-28 MED ORDER — SODIUM CHLORIDE 0.9 % IV SOLN
INTRAVENOUS | Status: DC
  Administered 2013-02-28: 18:00:00 via INTRAVENOUS

## 2013-02-28 MED ORDER — ONDANSETRON HCL 4 MG/2ML IJ SOLN: 4.0000 mg | Freq: Four times a day (QID) | INTRAMUSCULAR | Status: DC | PRN

## 2013-02-28 MED ORDER — OXYTOCIN 40 UNITS IN LACTATED RINGERS INFUSION - SIMPLE MED
62.5000 mL/h | INTRAVENOUS | Status: DC
  Administered 2013-02-28: 62.5 mL/h via INTRAVENOUS
  Filled 2013-02-28: qty 1000

## 2013-02-28 MED ORDER — MISOPROSTOL 200 MCG PO TABS
ORAL_TABLET | ORAL | Status: AC
  Administered 2013-02-28: 1000 ug via VAGINAL
  Filled 2013-02-28: qty 5

## 2013-02-28 MED ORDER — ZOLPIDEM TARTRATE 5 MG PO TABS: 5.0000 mg | ORAL_TABLET | Freq: Every evening | ORAL | Status: DC | PRN

## 2013-02-28 MED ORDER — IBUPROFEN 100 MG/5ML PO SUSP
600.0000 mg | Freq: Four times a day (QID) | ORAL | Status: DC
  Administered 2013-03-01 (×2): 600 mg via ORAL
  Filled 2013-02-28 (×3): qty 30

## 2013-02-28 MED ORDER — ONDANSETRON HCL 4 MG/2ML IJ SOLN
4.0000 mg | INTRAMUSCULAR | Status: DC | PRN
  Administered 2013-02-28: 4 mg via INTRAVENOUS
  Filled 2013-02-28: qty 2

## 2013-02-28 MED ORDER — DIPHENHYDRAMINE HCL 25 MG PO CAPS: 25.0000 mg | ORAL_CAPSULE | Freq: Four times a day (QID) | ORAL | Status: DC | PRN

## 2013-02-28 MED ORDER — ACETAMINOPHEN 325 MG PO TABS: 650.0000 mg | ORAL_TABLET | ORAL | Status: DC | PRN

## 2013-02-28 MED ORDER — FLEET ENEMA 7-19 GM/118ML RE ENEM: 1.0000 | ENEMA | RECTAL | Status: DC | PRN

## 2013-02-28 MED ORDER — LACTATED RINGERS IV SOLN: 500.0000 mL | INTRAVENOUS | Status: DC | PRN

## 2013-02-28 MED ORDER — WITCH HAZEL-GLYCERIN EX PADS: 1.0000 "application " | MEDICATED_PAD | CUTANEOUS | Status: DC | PRN

## 2013-02-28 MED ORDER — CITRIC ACID-SODIUM CITRATE 334-500 MG/5ML PO SOLN: 30.0000 mL | ORAL | Status: DC | PRN

## 2013-02-28 MED ORDER — SIMETHICONE 80 MG PO CHEW: 80.0000 mg | CHEWABLE_TABLET | ORAL | Status: DC | PRN

## 2013-02-28 MED ORDER — IBUPROFEN 600 MG PO TABS
600.0000 mg | ORAL_TABLET | Freq: Four times a day (QID) | ORAL | Status: DC
  Administered 2013-02-28: 600 mg via ORAL
  Filled 2013-02-28: qty 1

## 2013-02-28 MED ORDER — LIDOCAINE HCL (PF) 1 % IJ SOLN
30.0000 mL | INTRAMUSCULAR | Status: DC | PRN
  Filled 2013-02-28: qty 30

## 2013-02-28 MED ORDER — TETANUS-DIPHTH-ACELL PERTUSSIS 5-2.5-18.5 LF-MCG/0.5 IM SUSP: 0.5000 mL | Freq: Once | INTRAMUSCULAR | Status: DC

## 2013-02-28 MED ORDER — IBUPROFEN 600 MG PO TABS: 600.0000 mg | ORAL_TABLET | Freq: Four times a day (QID) | ORAL | Status: DC | PRN

## 2013-02-28 MED ORDER — FENTANYL CITRATE 0.05 MG/ML IJ SOLN
100.0000 ug | Freq: Once | INTRAMUSCULAR | Status: AC
  Administered 2013-02-28: 100 ug via INTRAVENOUS

## 2013-02-28 MED ORDER — LANOLIN HYDROUS EX OINT: TOPICAL_OINTMENT | CUTANEOUS | Status: DC | PRN

## 2013-02-28 MED ORDER — MISOPROSTOL 200 MCG PO TABS
1000.0000 ug | ORAL_TABLET | Freq: Once | ORAL | Status: AC
  Administered 2013-02-28: 1000 ug via VAGINAL

## 2013-02-28 MED ORDER — FERROUS SULFATE 325 (65 FE) MG PO TABS
325.0000 mg | ORAL_TABLET | Freq: Two times a day (BID) | ORAL | Status: DC
  Administered 2013-03-01 (×2): 325 mg via ORAL
  Filled 2013-02-28 (×2): qty 1

## 2013-02-28 MED ORDER — LACTATED RINGERS IV SOLN
INTRAVENOUS | Status: DC
  Administered 2013-02-28: 12:00:00 via INTRAVENOUS

## 2013-02-28 MED ORDER — SENNOSIDES-DOCUSATE SODIUM 8.6-50 MG PO TABS
2.0000 | ORAL_TABLET | ORAL | Status: DC
  Administered 2013-03-01 – 2013-03-03 (×2): 2 via ORAL
  Filled 2013-02-28 (×3): qty 2

## 2013-02-28 NOTE — Lactation Note (Signed)
This note was copied from the chart of [name]. Lactation Consultation Note  Patient Name: [name] ZOXWR'UToday's Date: 02/28/2013 Reason for consult: Initial assessment RN reports Mom is going to bottle and formula feed.   Maternal Data Formula Feeding for Exclusion: Yes Reason for exclusion: Mother's choice to formula feed on admision  Feeding Feeding Type: Formula  LATCH Score/Interventions                      Lactation Tools Discussed/Used     Consult Status Consult Status: Complete    Alfred LevinsGranger, Deamber Buckhalter Ann 02/28/2013, 11:34 PM

## 2013-02-28 NOTE — Progress Notes (Signed)
Post Partum Day 0 Subjective: Out to see patient due to Dr. Charlotta Newton calling about abnormal labs.  Pt with some contd elevated blood pressures after delivery and PIH labs obtained.  Pt states she is feeling improved at present.  Currently ordering food.  She denies headache, vision chgs, RUQ pain.    Objective: Blood pressure 138/59, pulse 86, temperature 99.5 F (37.5 C), temperature source Axillary, resp. rate 18, height 5\' 7"  (1.702 m), weight 66.679 kg (147 lb), unknown if currently breastfeeding.   Physical Exam:  General: alert, cooperative and no distress Heart:  RRR Lungs:  CTA bilat Lochia: appropriate Uterine Fundus: firm Incision: N/A DVT Evaluation: No evidence of DVT seen on physical exam. Negative Homan's sign. No significant calf/ankle edema. DTRs 1+, no clonus   Results for orders placed during the hospital encounter of 02/28/13 (from the past 24 hour(s))  CBC     Status: Abnormal   Collection Time    02/28/13 12:05 PM      Result Value Range   WBC 10.7 (*) 4.0 - 10.5 K/uL   RBC 3.50 (*) 3.87 - 5.11 MIL/uL   Hemoglobin 9.3 (*) 12.0 - 15.0 g/dL   HCT 16.1 (*) 09.6 - 04.5 %   MCV 79.4  78.0 - 100.0 fL   MCH 26.6  26.0 - 34.0 pg   MCHC 33.5  30.0 - 36.0 g/dL   RDW 40.9 (*) 81.1 - 91.4 %   Platelets 289  150 - 400 K/uL  COMPREHENSIVE METABOLIC PANEL     Status: Abnormal   Collection Time    02/28/13 12:05 PM      Result Value Range   Sodium 139  137 - 147 mEq/L   Potassium 2.8 (*) 3.7 - 5.3 mEq/L   Chloride 103  96 - 112 mEq/L   CO2 21  19 - 32 mEq/L   Glucose, Bld 99  70 - 99 mg/dL   BUN 8  6 - 23 mg/dL   Creatinine, Ser 7.82 (*) 0.50 - 1.10 mg/dL   Calcium 7.8 (*) 8.4 - 10.5 mg/dL   Total Protein 6.7  6.0 - 8.3 g/dL   Albumin 2.4 (*) 3.5 - 5.2 g/dL   AST 75 (*) 0 - 37 U/L   ALT 39 (*) 0 - 35 U/L   Alkaline Phosphatase 220 (*) 39 - 117 U/L   Total Bilirubin 0.7  0.3 - 1.2 mg/dL   GFR calc non Af Amer 46 (*) >90 mL/min   GFR calc Af Amer 53 (*) >90 mL/min   LACTATE DEHYDROGENASE     Status: Abnormal   Collection Time    02/28/13 12:05 PM      Result Value Range   LDH 385 (*) 94 - 250 U/L  URIC ACID     Status: Abnormal   Collection Time    02/28/13 12:05 PM      Result Value Range   Uric Acid, Serum 7.1 (*) 2.4 - 7.0 mg/dL  URINE RAPID DRUG SCREEN (HOSP PERFORMED)     Status: None   Collection Time    02/28/13 12:10 PM      Result Value Range   Opiates NONE DETECTED  NONE DETECTED   Cocaine NONE DETECTED  NONE DETECTED   Benzodiazepines NONE DETECTED  NONE DETECTED   Amphetamines NONE DETECTED  NONE DETECTED   Tetrahydrocannabinol NONE DETECTED  NONE DETECTED   Barbiturates NONE DETECTED  NONE DETECTED  PROTEIN / CREATININE RATIO, URINE  Status: None   Collection Time    02/28/13 12:10 PM      Result Value Range   Creatinine, Urine 42.37     Total Protein, Urine 5.5     PROTEIN CREATININE RATIO 0.13  0.00 - 0.15  URINALYSIS, ROUTINE W REFLEX MICROSCOPIC     Status: Abnormal   Collection Time    02/28/13 12:10 PM      Result Value Range   Color, Urine YELLOW  YELLOW   APPearance CLEAR  CLEAR   Specific Gravity, Urine <1.005 (*) 1.005 - 1.030   pH 7.0  5.0 - 8.0   Glucose, UA NEGATIVE  NEGATIVE mg/dL   Hgb urine dipstick TRACE (*) NEGATIVE   Bilirubin Urine NEGATIVE  NEGATIVE   Ketones, ur NEGATIVE  NEGATIVE mg/dL   Protein, ur NEGATIVE  NEGATIVE mg/dL   Urobilinogen, UA 0.2  0.0 - 1.0 mg/dL   Nitrite NEGATIVE  NEGATIVE   Leukocytes, UA NEGATIVE  NEGATIVE  URINE MICROSCOPIC-ADD ON     Status: Abnormal   Collection Time    02/28/13 12:10 PM      Result Value Range   Squamous Epithelial / LPF FEW (*) RARE   WBC, UA 0-2  <3 WBC/hpf   RBC / HPF 0-2  <3 RBC/hpf   Bacteria, UA FEW (*) RARE  MAGNESIUM     Status: None   Collection Time    02/28/13  2:40 PM      Result Value Range   Magnesium 1.5  1.5 - 2.5 mg/dL    Assessment/Plan: S/P vaginal delivery at 38w 5d Intermittent elevated blood pressure without  proteinuria Hypokalemia Elevated liver enzymes  Per consult with Dr. Charlotta Newtonzan, potassium replacement ordered and to be started this afternoon. Continue observation and recheck labs tonight.     LOS: 0 days   Marcquis Ridlon O. 02/28/2013, 4:01 PM

## 2013-02-28 NOTE — Progress Notes (Signed)
[name] is a 23 y.o. G1P1001 at 6073w5d admitted for active labor  Subjective: Pt pushing ineffectively intermittently with contractions.  Not pushing at times despite coaching.  States she is tired and does not want to push.  Family at bedside and supportive.    Objective: BP 154/77  Pulse 65  Temp(Src) 98.5 F (36.8 C) (Oral)  Resp 20  Ht 5\' 7"  (1.702 m)  Wt 66.679 kg (147 lb)  BMI 23.02 kg/m2  Appears to be sleeping between contractions FHT:  FHR: 120 bpm, variability: moderate,  accelerations:  Present,  decelerations:  Absent UC:   regular, every 2-3 minutes SVE:   Dilation: 10 Effacement (%): 100 Station: Crowning.  Progress noted when pt pushes as directed. Exam by:: nsmith,cnm  Labs: Lab Results  Component Value Date   WBC 10.7* 02/28/2013   HGB 9.3* 02/28/2013   HCT 27.8* 02/28/2013   MCV 79.4 02/28/2013   PLT 289 02/28/2013    Assessment / Plan: IUP at 38w 5d Active labor  Labor: 2nd stage of labor continues Preeclampsia:  no signs or symptoms of toxicity Fetal Wellbeing:  Category I Pain Control:  Labor support without medications I/D:  GBS neg/Afebrile Anticipated MOD:  NSVD  Will check urine drug screen due to patient behavior. Continue to have pt push with contractions.  Dr. Charlotta Newtonzan updated.    Harles Evetts O. 02/28/2013, 5:32 PM

## 2013-02-28 NOTE — Progress Notes (Signed)
Reviewing chart, in particular labs, as told by previous shift of pt's status.

## 2013-02-28 NOTE — H&P (Signed)
[name] is a 23 y.o. female presenting at 4838w 5d with c/o contractions and leakage of fluid.  HPI: Pt presents unannounced to [facility] with c/o of contractions every 2-3 minutes since approx 12:00 AM and SROM at 10:16 with light meconium stained amniotic fluid.    Maternal Medical History:  Reason for admission: Rupture of membranes and contractions.   Contractions: Onset was 6-12 hours ago.   Frequency: regular.   Perceived severity is strong.    Fetal activity: Perceived fetal activity is normal.   Last perceived fetal movement was within the past hour.    Prenatal complications: no prenatal complications Prenatal Complications - Diabetes: none.   Hx Present Pregnancy: Pt entered care at [redacted]wks gestation.  Neg quad screen.  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  Pt placed on Valtrex daily which she has contd to take.  Pt with neg 1hr GTT.  Pt with neg GBS as well as neg GC/Chl.  Remainder of prenatal course unremarkable.     OB History   Grav Para Term Preterm Abortions TAB SAB Ect Mult Living   1              Past Medical History  Diagnosis Date  . Asthma   . Seizures     still has them, not on meds, last at age 23   Past Surgical History  Procedure Laterality Date  . No past surgeries     Family History: family history includes Diabetes in her maternal grandmother; Kidney disease in her maternal grandmother. Social History:  reports that she has quit smoking. Her smoking use included Cigarettes. She smoked 0.25 packs per day. She has never used smokeless tobacco. She reports that she does not drink alcohol or use illicit drugs.   Prenatal Transfer Tool  Maternal Diabetes: No Genetic Screening: Normal Maternal Ultrasounds/Referrals: Normal Fetal Ultrasounds or other Referrals:  None Maternal Substance Abuse:  No Significant Maternal Medications:  None Significant Maternal Lab Results:   None Other Comments:  None  Review of Systems  Constitutional: Negative.   HENT: Negative.   Eyes: Negative.   Respiratory: Negative.   Cardiovascular: Negative.   Gastrointestinal: Negative.   Genitourinary: Negative.   Musculoskeletal: Negative.   Skin: Negative.   Neurological: Negative.   Endo/Heme/Allergies: Negative.   Psychiatric/Behavioral: Negative.     Dilation: 10 Effacement (%): 100 Station: +3 Exam by:: nsmith,cnm Blood pressure 152/68, pulse 76, temperature 98 F (36.7 C), temperature source Oral, resp. rate 20, height 5\' 7"  (1.702 m), weight 66.679 kg (147 lb). Maternal Exam:  Uterine Assessment: Contraction strength is firm.  Contraction frequency is regular.   Abdomen: Patient reports no abdominal tenderness. Fundal height is 37.   Estimated fetal weight is 6#.   Fetal presentation: vertex  Introitus: Normal vulva. Ferning test: not done.  Nitrazine test: not done. Amniotic fluid character: meconium stained.  Pelvis: adequate for delivery.   Cervix: Cervix evaluated by digital exam.     Fetal Exam Fetal Monitor Review: Mode: ultrasound.   Baseline rate: 125.  Variability: moderate (6-25 bpm).   Pattern: accelerations present, early decelerations and variable decelerations.    Fetal State Assessment: Category II - tracings are indeterminate.     Physical Exam  Constitutional: She is oriented to person, place, and time. She appears well-developed and well-nourished.  Pt not reacting to contractions other than moving in bed.  Keeping eyes closed and does not speak  unless questioned multiple times. Does not follow directions unless repeated but acknowledges receiving direction.  HENT:  Head: Normocephalic and atraumatic.  Right Ear: External ear normal.  Left Ear: External ear normal.  Nose: Nose normal.  Eyes: Conjunctivae are normal. Pupils are equal, round, and reactive to light.  Neck: Normal range of motion. Neck supple.  Cardiovascular:  Normal rate and intact distal pulses.   Respiratory: Effort normal and breath sounds normal.  GI: Soft. Bowel sounds are normal.  Genitourinary: Vagina normal.  Ext gent WNL.  BUS neg.  No evidence of HSV lesions of ext gent or at introitus but fetal vtx present at introitus on admission.  Unable to perform speculum exam.  Musculoskeletal: Normal range of motion.  Neurological: She is alert and oriented to person, place, and time. She has normal reflexes.  Skin: Skin is warm and dry.  Psychiatric: She has a normal mood and affect.    Prenatal labs: ABO, Rh: --/--/O POS (06/20 0006) Antibody: Negative (08/29 0000) Rubella: Immune (08/29 0000) RPR: Nonreactive (08/29 0000)  HBsAg: Negative (08/29 0000)  HIV: Non-reactive (08/29 0000)  GBS: Negative (08/29 0000)   Assessment/Plan: IUP at 38w 5d GBS neg Hx HSV  Admit to [facility] per consult with Dr. Charlotta Newton. Will begin pushing with urge.  [name] O. 02/28/2013, 11:04 AM

## 2013-02-28 NOTE — Progress Notes (Signed)
Pt unsedated. Lying in bed, Gives very slow one-word answers. I asked pt's aunt if pt's "personality" seemed normal. She said yes. Pt able to request paper to record baby's ouput. Does not say much to family or staff.

## 2013-02-28 NOTE — Progress Notes (Signed)
Called by RN due to temp of 103 with tender abdomen.  Concern for chorioamnionitis.  Pt started on Amp/Gent and Tylenol as needed for fever.  Will also continue IV hydration.  Discussed case with Elsie RaNona Smith, CNM who had recently been in to see patient.  Pt was responsive and did not appear to be in acute distress.  Vitals as below.  PIH labs completed due to elevated BP during labor, while negative for preeclampsia, K noted to be 2.8.  Will replace with po K, Mag level normal.  Repeat labs to be performed in am, if still abnormal will consider IM consult.  BP 128/75  Pulse 80  Temp(Src) 103 F (39.4 C) (Axillary)  Resp 22  Ht 5\' 7"  (1.702 m)  Wt 66.679 kg (147 lb)  BMI 23.02 kg/m2   Doris HidalgoJennifer Adella Manolis, DO [phone] (pager) [phone] (office)

## 2013-03-01 DIAGNOSIS — D649 Anemia, unspecified: Secondary | ICD-10-CM

## 2013-03-01 DIAGNOSIS — Z331 Pregnant state, incidental: Secondary | ICD-10-CM

## 2013-03-01 LAB — COMPREHENSIVE METABOLIC PANEL
ALBUMIN: 1.7 g/dL — AB (ref 3.5–5.2)
ALT: 26 U/L (ref 0–35)
AST: 54 U/L — ABNORMAL HIGH (ref 0–37)
Alkaline Phosphatase: 142 U/L — ABNORMAL HIGH (ref 39–117)
BUN: 7 mg/dL (ref 6–23)
CO2: 21 mEq/L (ref 19–32)
CREATININE: 1.64 mg/dL — AB (ref 0.50–1.10)
Calcium: 6.8 mg/dL — ABNORMAL LOW (ref 8.4–10.5)
Chloride: 105 mEq/L (ref 96–112)
GFR calc Af Amer: 51 mL/min — ABNORMAL LOW (ref >90)
GFR calc non Af Amer: 44 mL/min — ABNORMAL LOW (ref >90)
Glucose, Bld: 92 mg/dL (ref 70–99)
Potassium: 2.9 mEq/L — CL (ref 3.7–5.3)
Sodium: 140 mEq/L (ref 137–147)
Total Bilirubin: 0.4 mg/dL (ref 0.3–1.2)
Total Protein: 5.3 g/dL — ABNORMAL LOW (ref 6.0–8.3)

## 2013-03-01 LAB — HEPATITIS PANEL, ACUTE
HCV Ab: NEGATIVE
HEP A IGM: NONREACTIVE
Hep B C IgM: NONREACTIVE
Hepatitis B Surface Ag: NEGATIVE

## 2013-03-01 LAB — HEMOGLOBIN A1C
HEMOGLOBIN A1C: 6 % — AB (ref <5.7)
Mean Plasma Glucose: 126 mg/dL — ABNORMAL HIGH (ref <117)

## 2013-03-01 LAB — RENAL FUNCTION PANEL
ALBUMIN: 2.3 g/dL — AB (ref 3.5–5.2)
BUN: 7 mg/dL (ref 6–23)
CALCIUM: 7.3 mg/dL — AB (ref 8.4–10.5)
CO2: 24 mEq/L (ref 19–32)
Chloride: 104 mEq/L (ref 96–112)
Creatinine, Ser: 1.6 mg/dL — ABNORMAL HIGH (ref 0.50–1.10)
GFR, EST AFRICAN AMERICAN: 52 mL/min — AB (ref >90)
GFR, EST NON AFRICAN AMERICAN: 45 mL/min — AB (ref >90)
Glucose, Bld: 80 mg/dL (ref 70–99)
PHOSPHORUS: 4.4 mg/dL (ref 2.3–4.6)
POTASSIUM: 3 meq/L — AB (ref 3.7–5.3)
Sodium: 141 mEq/L (ref 137–147)

## 2013-03-01 LAB — PROTIME-INR
INR: 0.9 (ref 0.00–1.49)
PROTHROMBIN TIME: 12 s (ref 11.6–15.2)

## 2013-03-01 LAB — SAVE SMEAR

## 2013-03-01 LAB — TECHNOLOGIST SMEAR REVIEW

## 2013-03-01 LAB — CBC
HEMATOCRIT: 19.3 % — AB (ref 36.0–46.0)
Hemoglobin: 6.5 g/dL — CL (ref 12.0–15.0)
MCH: 27 pg (ref 26.0–34.0)
MCHC: 33.7 g/dL (ref 30.0–36.0)
MCV: 80.1 fL (ref 78.0–100.0)
Platelets: 234 10*3/uL (ref 150–400)
RBC: 2.41 MIL/uL — ABNORMAL LOW (ref 3.87–5.11)
RDW: 15.3 % (ref 11.5–15.5)
WBC: 12.4 10*3/uL — ABNORMAL HIGH (ref 4.0–10.5)

## 2013-03-01 LAB — RETICULOCYTES
RBC.: 2.43 MIL/uL — ABNORMAL LOW (ref 3.87–5.11)
Retic Count, Absolute: 41.3 10*3/uL (ref 19.0–186.0)
Retic Ct Pct: 1.7 % (ref 0.4–3.1)

## 2013-03-01 LAB — LACTATE DEHYDROGENASE: LDH: 485 U/L — ABNORMAL HIGH (ref 94–250)

## 2013-03-01 LAB — D-DIMER, QUANTITATIVE (NOT AT ARMC): D DIMER QUANT: 2.42 ug{FEU}/mL — AB (ref 0.00–0.48)

## 2013-03-01 LAB — FIBRINOGEN: FIBRINOGEN: 542 mg/dL — AB (ref 204–475)

## 2013-03-01 LAB — SODIUM, URINE, RANDOM: Sodium, Ur: 48 mEq/L

## 2013-03-01 LAB — CREATININE, URINE, RANDOM: CREATININE, URINE: 27.9 mg/dL

## 2013-03-01 LAB — TSH: TSH: 1.505 u[IU]/mL (ref 0.350–4.500)

## 2013-03-01 LAB — APTT: APTT: 20 s — AB (ref 24–37)

## 2013-03-01 LAB — HIV ANTIBODY (ROUTINE TESTING W REFLEX): HIV: NONREACTIVE

## 2013-03-01 MED ORDER — FERROUS SULFATE 325 (65 FE) MG PO TABS
325.0000 mg | ORAL_TABLET | Freq: Three times a day (TID) | ORAL | Status: DC
  Administered 2013-03-02 – 2013-03-04 (×8): 325 mg via ORAL
  Filled 2013-03-01 (×8): qty 1

## 2013-03-01 MED ORDER — MAGNESIUM OXIDE 400 (241.3 MG) MG PO TABS
400.0000 mg | ORAL_TABLET | Freq: Two times a day (BID) | ORAL | Status: DC
  Administered 2013-03-01 – 2013-03-02 (×3): 400 mg via ORAL
  Filled 2013-03-01 (×3): qty 1

## 2013-03-01 MED ORDER — POTASSIUM CHLORIDE CRYS ER 20 MEQ PO TBCR
40.0000 meq | EXTENDED_RELEASE_TABLET | Freq: Once | ORAL | Status: AC
  Administered 2013-03-01: 40 meq via ORAL
  Filled 2013-03-01: qty 2

## 2013-03-01 MED ORDER — HYDROMORPHONE HCL 2 MG PO TABS
2.0000 mg | ORAL_TABLET | ORAL | Status: DC | PRN
Start: 2013-03-01 — End: 2013-03-04

## 2013-03-01 MED ORDER — FOLIC ACID 1 MG PO TABS
1.0000 mg | ORAL_TABLET | Freq: Every day | ORAL | Status: DC
  Administered 2013-03-01 – 2013-03-04 (×4): 1 mg via ORAL
  Filled 2013-03-01 (×8): qty 1

## 2013-03-01 MED ORDER — MAGNESIUM SULFATE 50 % IJ SOLN
2.0000 g | Freq: Once | INTRAVENOUS | Status: DC
  Filled 2013-03-01: qty 4

## 2013-03-01 NOTE — Progress Notes (Signed)
COURTESY NOTE:  Curbsided by Hospitalist on this 23 y/o [place] prim[place]rav[place] day 2 postpartum presenting yesterday with temp 103, delivery vaginal and uncomplicated by report, but presenting labs significant for hypokalemia, elevated creatinine (1.57 - 1.64), and mild transaminitis (trending down). Platelets >200K, rare schistocytes on smear per report, but nl PT/INR and low APTT. LDH 385 - 485, haptoglobin and retic count pending, fibrinogen >500, DDimar 2.42. Hb 9.3 - 6.5 w MCV 80  Does not meet criteria for HUS, TTP, DIC or HELLP. May have hemolysis (but nl t bil). Would repeat Cmet and DIC panel in AM and add COOMBs If proves to have hemolysis we wilol proceed to consult.  GM

## 2013-03-01 NOTE — Consult Note (Addendum)
[facility] Medical Consultation  [name] BJY:[id number] DOB: 1990/05/08 DOA: 02/28/2013 PCP: Dorrene German, MD   Requesting physician: Dr. Charlotta Newton  Date of consultation: 03/01/2013 Reason for consultation: medical management   Impression/Recommendations Active Problems: Acute blood loss anemia - certainly worrisome for DIC post partum - will proceed with checking LDH, haptoglobin, PT/PTT, fibrinogen, DIC - hold off on blood transfusion for now until the blood test above are back Transaminitis - unclear if related to principal problem, ? Acute fatty liver of pregnancy, HUS, HELLP, DIC but plt's are WNL - there is a possibility of hepatorenal syndrome as well but I think this is less likely even though it can present with hypokalemia, hyponatremia, ARF, transaminitis  - will stop all hepatotoxic medications including tylenol products - will ask for acute hepatitis panel, check phosphate level which is part of renal function panel  - repeat CMET in AM - check A1C, lipid panel - AST>ALT, ? Alcohol effect - rule out hypertriglyceridemia and hyperinsulinemia (check lipid panel and A1C) - if LFT's persistently high, will consider RUQ [place] Acute renal failure - check Urine sodium and urine Cr - check renal panel now  - check renal US Hypokalemia - supplement Mg and potassium   I will followup again tomorrow. Please contact me if I can be of assistance in the meanwhile. Thank you for this consultation.  Chief Complaint: medical management   HPI: Pt is 23 yo female with no known past medical history, now post partum day #1, vaginal delivery with no complications post partum. Please note pt is somewhat poor historian, poor eye contact, denies any specific symptoms such as chest pain, shortness of breath, no abdominal or urinary concerns, no specific focal neurological symptoms. Pt also denies fevers, chills, no other systemic concerns. Ambulating well in hallway per nursing  staff. TRH consulted for further evaluation of acute renal failure, transaminitis, acute blood loss anemia post partum.    Review of Systems:  Constitutional: Negative for fever, chills, diaphoresis, activity change, appetite change and fatigue.  HENT: Negative for ear pain, nosebleeds, congestion, facial swelling, rhinorrhea, neck pain, neck stiffness and ear discharge.   Eyes: Negative for pain, discharge, redness, itching and visual disturbance.  Respiratory: Negative for cough, choking, chest tightness, shortness of breath, wheezing and stridor.   Cardiovascular: Negative for chest pain, palpitations and leg swelling.  Gastrointestinal: Negative for abdominal distention.  Genitourinary: Negative for dysuria, urgency, frequency, hematuria, flank pain, decreased urine volume, difficulty urinating and dyspareunia.  Musculoskeletal: Negative for back pain, joint swelling, arthralgias and gait problem.  Neurological: Negative for dizziness, tremors, seizures, syncope, facial asymmetry, speech difficulty, weakness, light-headedness, numbness and headaches.  Hematological: Negative for adenopathy. Does not bruise/bleed easily.  Psychiatric/Behavioral: Negative for hallucinations, behavioral problems, confusion, dysphoric mood, decreased concentration and agitation.   Past Medical History  Diagnosis Date  . Asthma   . Seizures     still has them, not on meds, last at age 19   Past Surgical History  Procedure Laterality Date  . No past surgeries     Social History:  reports that she has quit smoking. Her smoking use included Cigarettes. She smoked 0.25 packs per day. She has never used smokeless tobacco. She reports that she does not drink alcohol or use illicit drugs.  No Known Allergies Family History  Problem Relation Age of Onset  . Diabetes Maternal Grandmother   . Kidney disease Maternal Grandmother     dialysis    Prior to Admission medications  Medication Sig Start Date End  Date Taking? Authorizing Provider  valACYclovir (VALTREX) 500 MG tablet Take 500 mg by mouth 2 (two) times daily.   Yes Historical Provider, MD   Physical Exam: Blood pressure 123/75, pulse 71, temperature 97.5 F (36.4 C), temperature source Oral, resp. rate 18, height 5\' 7"  (1.702 m), weight 66.679 kg (147 lb), unknown if currently breastfeeding. Filed Vitals:   03/01/13 0902  BP: 123/75  Pulse: 71  Temp: 97.5 F (36.4 C)  Resp: 18   Physical Exam  Constitutional: Appears well-developed and well-nourished. No distress.  HENT: Normocephalic. External right and left ear normal. Oropharynx is clear and moist.  Eyes: Conjunctivae and EOM are normal. PERRLA, no scleral icterus.  Neck: Normal ROM. Neck supple. No JVD. No tracheal deviation. No thyromegaly.  CVS: RRR, S1/S2 +, no murmurs, no gallops, no carotid bruit.  Pulmonary: Effort and breath sounds normal, no stridor, rhonchi, wheezes, rales.  Abdominal: Soft. BS +,  no distension, tenderness, rebound or guarding.   Musculoskeletal: Normal range of motion. No edema and no tenderness.   Lymphadenopathy: No lymphadenopathy noted, cervical, inguinal.  Neuro: Alert. Normal reflexes, muscle tone coordination. No cranial nerve deficit. Skin: Skin is warm and dry. No rash noted. Not diaphoretic. No erythema. No pallor.  Psychiatric: Normal mood and affect. Behavior, judgment, thought content normal.   Labs on Admission:  Basic Metabolic Panel:  Recent Labs Lab 02/28/13 1205 02/28/13 1440 03/01/13 0618  NA 139  --  140  K 2.8*  --  2.9*  CL 103  --  105  CO2 21  --  21  GLUCOSE 99  --  92  BUN 8  --  7  CREATININE 1.57*  --  1.64*  CALCIUM 7.8*  --  6.8*  MG  --  1.5  --    Liver Function Tests:  Recent Labs Lab 02/28/13 1205 03/01/13 0618  AST 75* 54*  ALT 39* 26  ALKPHOS 220* 142*  BILITOT 0.7 0.4  PROT 6.7 5.3*  ALBUMIN 2.4* 1.7*   CBC:  Recent Labs Lab 02/28/13 1205 03/01/13 0618  WBC 10.7* 12.4*  HGB  9.3* 6.5*  HCT 27.8* 19.3*  MCV 79.4 80.1  PLT 289 234   Radiological Exams on Admission: No results found.  EKG: Not obtained   Time spent: 45 minutes   MAGICK-Akshith Moncus [facility] Pager [phone]  If 7PM-7AM, please contact night-coverage [email or web address] Password [email or web address] 03/01/2013, 11:51 AM

## 2013-03-01 NOTE — Progress Notes (Addendum)
Postpartum Note Day # 1  S:  Patient resting comfortable in bed.  Pain controlled.  Tolerating general. + flatus, no BM.  Lochia appropriate.  Ambulating without difficulty.  She denies n/v/f/c, SOB, or CP.  Pt reports no acute complaints.  O: Temp:  [97.9 F (36.6 C)-103 F (39.4 C)] 97.9 F (36.6 C) (02/08 0334) Pulse Rate:  [63-173] 63 (02/08 0334) Resp:  [18-22] 20 (02/08 0334) BP: (113-170)/(34-126) 113/66 mmHg (02/08 0334) Weight:  [66.679 kg (147 lb)] 66.679 kg (147 lb) (02/07 1024)  Last temp: 2/7 @ 1812, 100.8  Gen: A&Ox3, NAD CV: RRR, no MRG Resp: CTAB Abdomen: soft, NT, ND, +BS Uterus: firm, non-tender, @ umbilicus Ext: No edema, no calf tenderness bilaterally, SCDs in place  Labs:  CBC    Component Value Date/Time   WBC 12.4* 03/01/2013 0618   RBC 2.41* 03/01/2013 0618   HGB 6.5* 03/01/2013 0618   HCT 19.3* 03/01/2013 0618   PLT 234 03/01/2013 0618   MCV 80.1 03/01/2013 0618   MCH 27.0 03/01/2013 0618   MCHC 33.7 03/01/2013 0618   RDW 15.3 03/01/2013 0618   LYMPHSABS 2.5 02/10/2011 2033   MONOABS 0.4 02/10/2011 2033   EOSABS 0.1 02/10/2011 2033   BASOSABS 0.0 02/10/2011 2033   CMP: Na/K 140/2.9, 105/21, BUN/Cr: 7/1.64 AST/ALT: 54/26  A/P: Pt is a 23 y.o. G1P1001 s/p NSVD, PPD #1 -Chorioamnionitis:  Tmax 103, last temp @ 1812 (02/28/13) pt currently on Amp/Gent, to be continued until 24hr afebrile  -FEN In light of continued hypokalemia, elevated Cr- concern for acute renal process vs abnormal preeclampsia presentation -will hold off on Magnesium for now IM consult for further management Pt [facility] by [facility]- Dr. Petra KubaKilpatrick notified [phone]) Continue NS @ 125cc/hr Strict I/Os Current K replacement with K-dur 40 bid currently asymptomatic   HEME -Hgb with significant drop from 9.3 to postpartum 6.5.  EBL: 450cc,  pt currently asymptomatic- denies SOB, heart palpitations or dizziness.  No evidence of active bleeding, lochia appropriate.  Will hold off on blood  transfusion as the patient is asymptomatic.  Iron tid Repeat CBC in am  - Pain well controlled, tolerating general diet, voiding freely -Continue with routine postpartum management -Activity: encouraged sitting up to chair and ambulation as tolerated  ADDENDUM: Dr. Petra KubaKilpatrick called back, they refer to the Hospitalist service-- [facility]  Myna HidalgoJennifer Shastina Rua, DO [phone] (pager) [phone] (office)

## 2013-03-01 NOTE — Progress Notes (Signed)
Clinical Social Work Department PSYCHOSOCIAL ASSESSMENT - MATERNAL/CHILD 03/01/2013  Patient:  [name],[name]  Account Number:  [id number]  Admit Date:  02/28/2013  Childs Name:   Jaden Maurice Mcmeans Short    Clinical Social Worker:  [name], LCSW   Date/Time:  03/01/2013 11:00 AM  Date Referred:  03/01/2013   Referral source  Central Nursery     Referred reason  Psychosocial assessment   Other referral source:    I:  FAMILY / HOME ENVIRONMENT Child's legal guardian:  PARENT  Guardian - Name Guardian - Age Guardian - Address  [name],[name] 22 [address]  [place], [place] [address]  Short, Charlie     Other household support members/support persons Other support:    II  PSYCHOSOCIAL DATA Information Source:    Financial and Community Resources Employment:   FOB is employed and mother recieves SSI   Financial resources:  Medicaid If Medicaid - County:   Other  WIC  Food Stamps   School / Grade:   Maternity Care Coordinator / Child Services Coordination / Early Interventions:  Cultural issues impacting care:    III  STRENGTHS Strengths  Supportive family/friends  Home prepared for Child (including basic supplies)  Adequate Resources   Strength comment:    IV  RISK FACTORS AND CURRENT PROBLEMS Current Problem:     Risk Factor & Current Problem Patient Issue Family Issue Risk Factor / Current Problem Comment   N N     V  SOCIAL WORK ASSESSMENT Acknowledge MD order for social work consult.  Per chart review, while patient was in [facility], delayed bonding and decreased cognitive status was noted.  Spoke with patient's nurse.  Informed that the previous shift was concerned about mother's lack of interest in caring for newborn.  Her ability to adequately care for newborn was also questioned. However, it was noted that mother did have a temp of 103. According to patient's nurse, she is doing much better today.   She is more alert and engaging with  newborn.  Good bonding was noted and patient has been participating in caring for newborn.  FOB is reportedly very involved with care of newborn when in the room.  Met with mother and her younger sister.  Informed that she is a single parent with no other dependents.  She and FOB have been in a relationship for the past 3 years.  He is employed and reportedly supportive.  Informed that mother currently reside with maternal grandmother, maternal aunt, maternal uncle and another aunt.  Informed that maternal grandmother works, but most of her time is spend a lot of time at home.  Patient's sister states that mother will have adequate help/support at home.  Informed that there will always be someone available to assist her with newborn.  Mother states that she has hx of seizures but have not had a seizure for several years.   She is reportedly on disability but was unsure of the reason she is receiving SSI.  She did state that she struggled in school. Discussed her behavior during labor.   Mother states that she was sleepy and had difficulty staying awake because she was sleep deprived.  She denied being on any mood altering substance at the time.  She also denies any hx of mental illness. She reported no symptom of depression at this time.  She admits to use of marijuana in the past but stated she stop using once she became aware of the pregnancy.    UDS on mother was negative.  Discussed possible negative consequences of continued use and discouraged her from further use.  Patient denies any hx of other illicit drug use.   She verbalized being excited about the baby and states that she's confident that she will be able to care for him.  Sister notes that mother will have a lot of support at home.  During CSW visit, mother held the baby and interacted appropriately.  Mother informed of social work availability.      VI SOCIAL WORK PLAN  Type of pt/family education:   If child protective services report - county:    If child protective services report - date:   Information/referral to community resources comment:   Other social work plan:   Will continue to follow PRN     

## 2013-03-02 ENCOUNTER — Ambulatory Visit (HOSPITAL_COMMUNITY): Payer: Medicaid Other

## 2013-03-02 DIAGNOSIS — J45909 Unspecified asthma, uncomplicated: Secondary | ICD-10-CM

## 2013-03-02 LAB — CBC WITH DIFFERENTIAL/PLATELET
BASOS PCT: 0 % (ref 0–1)
Basophils Absolute: 0 10*3/uL (ref 0.0–0.1)
EOS ABS: 0.2 10*3/uL (ref 0.0–0.7)
EOS PCT: 2 % (ref 0–5)
HEMATOCRIT: 17.8 % — AB (ref 36.0–46.0)
HEMOGLOBIN: 6 g/dL — AB (ref 12.0–15.0)
Lymphocytes Relative: 23 % (ref 12–46)
Lymphs Abs: 2.1 10*3/uL (ref 0.7–4.0)
MCH: 27 pg (ref 26.0–34.0)
MCHC: 33.7 g/dL (ref 30.0–36.0)
MCV: 80.2 fL (ref 78.0–100.0)
MONO ABS: 0.6 10*3/uL (ref 0.1–1.0)
MONOS PCT: 7 % (ref 3–12)
NEUTROS ABS: 6.3 10*3/uL (ref 1.7–7.7)
Neutrophils Relative %: 69 % (ref 43–77)
Platelets: 242 10*3/uL (ref 150–400)
RBC: 2.22 MIL/uL — ABNORMAL LOW (ref 3.87–5.11)
RDW: 16 % — AB (ref 11.5–15.5)
WBC: 9.2 10*3/uL (ref 4.0–10.5)

## 2013-03-02 LAB — MRSA PCR SCREENING: MRSA by PCR: NEGATIVE

## 2013-03-02 LAB — DIC (DISSEMINATED INTRAVASCULAR COAGULATION) PANEL
APTT: 30 s (ref 24–37)
FIBRINOGEN: 509 mg/dL — AB (ref 204–475)
Platelets: 241 10*3/uL (ref 150–400)
SMEAR REVIEW: NONE SEEN

## 2013-03-02 LAB — PROTEIN / CREATININE RATIO, URINE
Creatinine, Urine: 37.18 mg/dL
Protein Creatinine Ratio: 1.56 — ABNORMAL HIGH (ref 0.00–0.15)
Total Protein, Urine: 58.1 mg/dL

## 2013-03-02 LAB — DIC (DISSEMINATED INTRAVASCULAR COAGULATION)PANEL
D-Dimer, Quant: 1.92 ug/mL-FEU — ABNORMAL HIGH (ref 0.00–0.48)
INR: 0.98 (ref 0.00–1.49)
Prothrombin Time: 12.8 seconds (ref 11.6–15.2)

## 2013-03-02 LAB — DIRECT ANTIGLOBULIN TEST (NOT AT ARMC)
DAT, IgG: NEGATIVE
DAT, complement: NEGATIVE

## 2013-03-02 LAB — COMPREHENSIVE METABOLIC PANEL
ALK PHOS: 138 U/L — AB (ref 39–117)
ALT: 32 U/L (ref 0–35)
AST: 57 U/L — AB (ref 0–37)
Albumin: 1.9 g/dL — ABNORMAL LOW (ref 3.5–5.2)
BUN: 6 mg/dL (ref 6–23)
CALCIUM: 7.2 mg/dL — AB (ref 8.4–10.5)
CO2: 26 mEq/L (ref 19–32)
Chloride: 105 mEq/L (ref 96–112)
Creatinine, Ser: 1.49 mg/dL — ABNORMAL HIGH (ref 0.50–1.10)
GFR calc Af Amer: 57 mL/min — ABNORMAL LOW (ref >90)
GFR calc non Af Amer: 49 mL/min — ABNORMAL LOW (ref >90)
Glucose, Bld: 93 mg/dL (ref 70–99)
POTASSIUM: 3 meq/L — AB (ref 3.7–5.3)
Sodium: 142 mEq/L (ref 137–147)
TOTAL PROTEIN: 5.6 g/dL — AB (ref 6.0–8.3)
Total Bilirubin: 0.3 mg/dL (ref 0.3–1.2)

## 2013-03-02 LAB — MAGNESIUM
MAGNESIUM: 4.8 mg/dL — AB (ref 1.5–2.5)
Magnesium: 6.2 mg/dL (ref 1.5–2.5)

## 2013-03-02 LAB — HAPTOGLOBIN: Haptoglobin: 149 mg/dL (ref 45–215)

## 2013-03-02 LAB — LIPID PANEL
Cholesterol: 216 mg/dL — ABNORMAL HIGH (ref 0–200)
HDL: 31 mg/dL — AB (ref >39)
LDL CALC: 138 mg/dL — AB (ref 0–99)
Total CHOL/HDL Ratio: 7 RATIO
Triglycerides: 237 mg/dL — ABNORMAL HIGH (ref <150)
VLDL: 47 mg/dL — ABNORMAL HIGH (ref 0–40)

## 2013-03-02 MED ORDER — MAGNESIUM SULFATE BOLUS VIA INFUSION
4.0000 g | Freq: Once | INTRAVENOUS | Status: AC
  Administered 2013-03-02: 4 g via INTRAVENOUS
  Filled 2013-03-02: qty 500

## 2013-03-02 MED ORDER — POTASSIUM CHLORIDE 20 MEQ/15ML (10%) PO LIQD
40.0000 meq | Freq: Three times a day (TID) | ORAL | Status: DC
  Administered 2013-03-02 – 2013-03-04 (×5): 40 meq via ORAL
  Filled 2013-03-02 (×12): qty 30

## 2013-03-02 MED ORDER — MAGNESIUM SULFATE 40 G IN LACTATED RINGERS - SIMPLE
0.5000 g/h | INTRAVENOUS | Status: DC
  Administered 2013-03-02: 1 g/h via INTRAVENOUS
  Administered 2013-03-03: 0.5 g/h via INTRAVENOUS
  Filled 2013-03-02: qty 500

## 2013-03-02 MED ORDER — LACTATED RINGERS IV SOLN
INTRAVENOUS | Status: DC
  Administered 2013-03-02 – 2013-03-03 (×3): via INTRAVENOUS

## 2013-03-02 MED ORDER — POTASSIUM CHLORIDE CRYS ER 20 MEQ PO TBCR
40.0000 meq | EXTENDED_RELEASE_TABLET | Freq: Once | ORAL | Status: AC
  Administered 2013-03-02: 40 meq via ORAL
  Filled 2013-03-02: qty 2

## 2013-03-02 MED ORDER — POTASSIUM CHLORIDE CRYS ER 20 MEQ PO TBCR
40.0000 meq | EXTENDED_RELEASE_TABLET | Freq: Three times a day (TID) | ORAL | Status: DC
  Filled 2013-03-02 (×6): qty 2

## 2013-03-02 MED ORDER — MAGNESIUM SULFATE 40 G IN LACTATED RINGERS - SIMPLE
2.0000 g/h | INTRAVENOUS | Status: DC
  Filled 2013-03-02: qty 500

## 2013-03-02 MED ORDER — MAGNESIUM SULFATE 4000MG/100ML IJ SOLN
4.0000 g | Freq: Once | INTRAMUSCULAR | Status: DC
  Filled 2013-03-02: qty 100

## 2013-03-02 NOTE — Progress Notes (Signed)
Critical Value - Magnesium Sulfate level of 6.2 called to Dr. Su Hiltoberts.  Orders received.

## 2013-03-02 NOTE — Progress Notes (Signed)
Patient ID: [name], female   DOB: 1990-09-02, 23 y.o.   MRN: [id number] [facility] PROGRESS NOTE  [name] WUJ:[id number] DOB: Jan 18, 1991 DOA: 02/28/2013 PCP: Doris German, MD  Brief narrative: HPI: Pt is 23 yo female with no known past medical history, now post partum day #2, vaginal delivery with no complications post partum. Please note pt is somewhat poor historian, poor eye contact, denies any specific symptoms such as chest pain, shortness of breath, no abdominal or urinary concerns, no specific focal neurological symptoms. Pt also denies fevers, chills, no other systemic concerns. Ambulating well in hallway per nursing staff. [facility] consulted for further evaluation of acute renal failure, transaminitis, acute blood loss anemia post partum.   Active Problems:  Acute blood loss anemia  - hold off on blood transfusion as pt is asymptomatic  - per primary team  Transaminitis  - unclear etiology and slightly better compared to yesterday - close monitoring with daily CMET recommended  Acute renal failure  - FENa 2% - renal US pending, Cr is trending down, hopefully it will continue trending down   - continue IVF - repeat BMP in AM Hypokalemia  - still low, continue to supplement but will change to TID  I will followup again tomorrow. Please contact me if I can be of assistance in the meanwhile. Thank you for this consultation.  Procedures/Studies:  Renal US 02/09 -->   Antibiotics:  None  Code Status: Full Family Communication: Pt at bedside Disposition Plan: Home when medically stable  HPI/Subjective: No events overnight.   Objective: Filed Vitals:   03/02/13 1200 03/02/13 1202 03/02/13 1300 03/02/13 1324  BP: 135/86  142/107 134/86  Pulse: 66  76 80  Temp: 98.6 F (37 C)     TempSrc: Oral     Resp: 18     Height:      Weight:  59.285 kg (130 lb 11.2 oz)    SpO2: 100%  100% 97%    Intake/Output Summary (Last 24 hours) at 03/02/13  1447 Last data filed at 03/02/13 1400  Gross per 24 hour  Intake 2384.17 ml  Output   5965 ml  Net -3580.83 ml    Exam:   General:  Pt is alert, follows commands appropriately, not in acute distress  Cardiovascular: Regular rate and rhythm, S1/S2, no murmurs, no rubs, no gallops  Respiratory: Clear to auscultation bilaterally, no wheezing, no crackles, no rhonchi  Abdomen: Soft, non tender, non distended, bowel sounds present, no guarding  Extremities: No edema, pulses DP and PT palpable bilaterally  Neuro: Grossly nonfocal  Data Reviewed: Basic Metabolic Panel:  Recent Labs Lab 02/28/13 1205 02/28/13 1440 03/01/13 0618 03/01/13 1307 03/02/13 0647  NA 139  --  140 141 142  K 2.8*  --  2.9* 3.0* 3.0*  CL 103  --  105 104 105  CO2 21  --  21 24 26   GLUCOSE 99  --  92 80 93  BUN 8  --  7 7 6   CREATININE 1.57*  --  1.64* 1.60* 1.49*  CALCIUM 7.8*  --  6.8* 7.3* 7.2*  MG  --  1.5  --   --   --   PHOS  --   --   --  4.4  --    Liver Function Tests:  Recent Labs Lab 02/28/13 1205 03/01/13 0618 03/01/13 1307 03/02/13 0647  AST 75* 54*  --  57*  ALT 39* 26  --  32  ALKPHOS 220* 142*  --  138*  BILITOT 0.7 0.4  --  0.3  PROT 6.7 5.3*  --  5.6*  ALBUMIN 2.4* 1.7* 2.3* 1.9*   CBC:  Recent Labs Lab 02/28/13 1205 03/01/13 0618 03/02/13 0646 03/02/13 0647  WBC 10.7* 12.4*  --  9.2  NEUTROABS  --   --   --  6.3  HGB 9.3* 6.5*  --  6.0*  HCT 27.8* 19.3*  --  17.8*  MCV 79.4 80.1  --  80.2  PLT 289 234 241 242    Recent Results (from the past 240 hour(s))  MRSA PCR SCREENING     Status: None   Collection Time    03/02/13 11:55 AM      Result Value Range Status   MRSA by PCR NEGATIVE  NEGATIVE Final   Comment:            The GeneXpert MRSA Assay (FDA     approved for NASAL specimens     only), is one component of a     comprehensive MRSA colonization     surveillance program. It is not     intended to diagnose MRSA     infection nor to guide or      monitor treatment for     MRSA infections.     Scheduled Meds: . ferrous sulfate  325 mg Oral TID WC  . folic acid  1 mg Oral Daily  . potassium chloride  40 mEq Oral BID  . prenatal multivitamin  1 tablet Oral Q1200  . senna-docusate  2 tablet Oral Q24H  . Tdap  0.5 mL Intramuscular Once   Continuous Infusions: . sodium chloride 125 mL/hr at 02/28/13 1748  . lactated ringers 100 mL/hr at 03/02/13 1200  . magnesium sulfate 2 g/hr (03/02/13 1202)     Doris PrestoMAGICK-Basya Casavant, MD  [facility] Pager [phone]  If 7PM-7AM, please contact night-coverage [email or web address] Password [email or web address] 03/02/2013, 2:47 PM   LOS: 2 days

## 2013-03-02 NOTE — Progress Notes (Signed)
UR chart review completed.  

## 2013-03-02 NOTE — Progress Notes (Signed)
Post Partum Day 2 Subjective:  Well. Denies H/a, visual symptoms, epigastric pain, dizziness, tinnitus or any symptoms of anemia.  Lochia are normal. Voiding, ambulating, tolerating normal diet. Flatus is normal. No BM. feeding going well.  Objective: Blood pressure 138/81, pulse 77, temperature 97.9 F (36.6 C), temperature source Oral, resp. rate 16, height 5\' 7"  (1.702 m), weight 147 lb (66.679 kg), SpO2 100.00%, unknown if currently breastfeeding.  Physical Exam:  General: normal Lochia: appropriate Uterine Fundus: 0/2 firm non-tender  Extremities: No evidence of DVT seen on physical exam. Edema minimal. DTR 1-2/4 without clonus  I/O: urine ouptput is excellent with -1242 this day so far     Recent Labs  03/01/13 0618 03/02/13 0647  HGB 6.5* 6.0*  HCT 19.3* 17.8*   Results for [name], [name] (MRN [id number]) as of 03/02/2013 10:36  Ref. Range 03/02/2013 06:47  Sodium Latest Range: 137-147 mEq/Bailey 142  Potassium Latest Range: 3.7-5.3 mEq/Bailey 3.0 (Bailey)  Chloride Latest Range: 96-112 mEq/Bailey 105  CO2 Latest Range: 19-32 mEq/Bailey 26  BUN Latest Range: 6-23 mg/dL 6  Creatinine Latest Range: 0.50-1.10 mg/dL 4.091.49 (H)  Calcium Latest Range: 8.4-10.5 mg/dL 7.2 (Bailey)  GFR calc non Af Amer Latest Range: >90 mL/min 49 (Bailey)  GFR calc Af Amer Latest Range: >90 mL/min 57 (Bailey)  Glucose Latest Range: 70-99 mg/dL 93  Alkaline Phosphatase Latest Range: 39-117 U/Bailey 138 (H)  Albumin Latest Range: 3.5-5.2 g/dL 1.9 (Bailey)  AST Latest Range: 0-37 U/Bailey 57 (H)  ALT Latest Range: 0-35 U/Bailey 32  Total Protein Latest Range: 6.0-8.3 g/dL 5.6 (Bailey)  Total Bilirubin Latest Range: 0.3-1.2 mg/dL 0.3  Cholesterol Latest Range: 0-200 mg/dL 811216 (H)  Triglycerides Latest Range: <150 mg/dL 914237 (H)  HDL Latest Range: >39 mg/dL 31 (Bailey)  LDL (calc) Latest Range: 0-99 mg/dL 782138 (H)  VLDL Latest Range: 0-40 mg/dL 47 (H)  Total CHOL/HDL Ratio No range found 7.0  WBC Latest Range: 4.0-10.5 K/uL 9.2  RBC Latest Range: 3.87-5.11  MIL/uL 2.22 (Bailey)  Hemoglobin Latest Range: 12.0-15.0 g/dL 6.0 (LL)  HCT Latest Range: 36.0-46.0 % 17.8 (Bailey)  MCV Latest Range: 78.0-100.0 fL 80.2  MCH Latest Range: 26.0-34.0 pg 27.0  MCHC Latest Range: 30.0-36.0 g/dL 95.633.7  RDW Latest Range: 11.5-15.5 % 16.0 (H)  Platelets Latest Range: 150-400 K/uL 242    Assessment/Plan: After review of hospital stay and labs, strongly suspect atypical presentation of pre-eclampsia. Recommend 24 hour of MgSO4 with close monitoring due to elevated Creatinine. Plan discussed with dr Vincenza HewsQuinn MFM who concurs. Also discussed with Dr Izola PriceMyers in Internal Medicine who is monitoring patient closely. Plan of care reviewed with patient and mother. Questions answered. Patient agreeable to proceed.  LOS: 2 days   Travas Schexnayder A MD 03/02/2013, 10:55 AM

## 2013-03-03 LAB — COMPREHENSIVE METABOLIC PANEL
ALBUMIN: 2.2 g/dL — AB (ref 3.5–5.2)
ALK PHOS: 168 U/L — AB (ref 39–117)
ALT: 38 U/L — ABNORMAL HIGH (ref 0–35)
ALT: 36 U/L — AB (ref 0–35)
AST: 51 U/L — ABNORMAL HIGH (ref 0–37)
AST: 49 U/L — ABNORMAL HIGH (ref 0–37)
Albumin: 2.4 g/dL — ABNORMAL LOW (ref 3.5–5.2)
Alkaline Phosphatase: 185 U/L — ABNORMAL HIGH (ref 39–117)
BILIRUBIN TOTAL: 0.3 mg/dL (ref 0.3–1.2)
BUN: 4 mg/dL — ABNORMAL LOW (ref 6–23)
BUN: 3 mg/dL — AB (ref 6–23)
CHLORIDE: 99 meq/L (ref 96–112)
CO2: 25 mEq/L (ref 19–32)
CO2: 28 mEq/L (ref 19–32)
CREATININE: 1.33 mg/dL — AB (ref 0.50–1.10)
Calcium: 7.6 mg/dL — ABNORMAL LOW (ref 8.4–10.5)
Calcium: 8.2 mg/dL — ABNORMAL LOW (ref 8.4–10.5)
Chloride: 100 mEq/L (ref 96–112)
Creatinine, Ser: 1.19 mg/dL — ABNORMAL HIGH (ref 0.50–1.10)
GFR calc Af Amer: 75 mL/min — ABNORMAL LOW (ref >90)
GFR calc non Af Amer: 64 mL/min — ABNORMAL LOW (ref >90)
GFR, EST AFRICAN AMERICAN: 65 mL/min — AB (ref >90)
GFR, EST NON AFRICAN AMERICAN: 56 mL/min — AB (ref >90)
GLUCOSE: 95 mg/dL (ref 70–99)
Glucose, Bld: 122 mg/dL — ABNORMAL HIGH (ref 70–99)
POTASSIUM: 3.4 meq/L — AB (ref 3.7–5.3)
Potassium: 3.4 mEq/L — ABNORMAL LOW (ref 3.7–5.3)
SODIUM: 139 meq/L (ref 137–147)
Sodium: 140 mEq/L (ref 137–147)
Total Bilirubin: 0.3 mg/dL (ref 0.3–1.2)
Total Protein: 6.1 g/dL (ref 6.0–8.3)
Total Protein: 6.6 g/dL (ref 6.0–8.3)

## 2013-03-03 LAB — CBC
HCT: 21.6 % — ABNORMAL LOW (ref 36.0–46.0)
HEMATOCRIT: 23.7 % — AB (ref 36.0–46.0)
Hemoglobin: 7.2 g/dL — ABNORMAL LOW (ref 12.0–15.0)
Hemoglobin: 7.9 g/dL — ABNORMAL LOW (ref 12.0–15.0)
MCH: 26.8 pg (ref 26.0–34.0)
MCH: 26.8 pg (ref 26.0–34.0)
MCHC: 33.3 g/dL (ref 30.0–36.0)
MCHC: 33.3 g/dL (ref 30.0–36.0)
MCV: 80.3 fL (ref 78.0–100.0)
MCV: 80.3 fL (ref 78.0–100.0)
PLATELETS: 307 10*3/uL (ref 150–400)
Platelets: 356 10*3/uL (ref 150–400)
RBC: 2.69 MIL/uL — ABNORMAL LOW (ref 3.87–5.11)
RBC: 2.95 MIL/uL — ABNORMAL LOW (ref 3.87–5.11)
RDW: 16 % — AB (ref 11.5–15.5)
RDW: 16 % — AB (ref 11.5–15.5)
WBC: 9.7 10*3/uL (ref 4.0–10.5)
WBC: 9.9 10*3/uL (ref 4.0–10.5)

## 2013-03-03 LAB — BASIC METABOLIC PANEL
BUN: 3 mg/dL — ABNORMAL LOW (ref 6–23)
CALCIUM: 7.9 mg/dL — AB (ref 8.4–10.5)
CO2: 27 meq/L (ref 19–32)
CREATININE: 1.26 mg/dL — AB (ref 0.50–1.10)
Chloride: 98 mEq/L (ref 96–112)
GFR calc Af Amer: 70 mL/min — ABNORMAL LOW (ref >90)
GFR calc non Af Amer: 60 mL/min — ABNORMAL LOW (ref >90)
Glucose, Bld: 102 mg/dL — ABNORMAL HIGH (ref 70–99)
Potassium: 4.1 mEq/L (ref 3.7–5.3)
Sodium: 137 mEq/L (ref 137–147)

## 2013-03-03 LAB — MAGNESIUM
MAGNESIUM: 6.3 mg/dL — AB (ref 1.5–2.5)
Magnesium: 6.1 mg/dL (ref 1.5–2.5)
Magnesium: 5.7 mg/dL — ABNORMAL HIGH (ref 1.5–2.5)

## 2013-03-03 LAB — URIC ACID: URIC ACID, SERUM: 6.5 mg/dL (ref 2.4–7.0)

## 2013-03-03 NOTE — Progress Notes (Signed)
Patient ID: [name], female   DOB: [birth year], 23 y.o.   MRN: [id number]  [facility] PROGRESS NOTE  [name] WUJ:[id number] DOB: [birth year] DOA: 02/28/2013 PCP: Dorrene GermanAVBUERE,EDWIN A, MD  Brief narrative: HPI: Pt is 23 yo female with no known past medical history, now post partum day #2, vaginal delivery with no complications post partum. Please note pt is somewhat poor historian, poor eye contact, denies any specific symptoms such as chest pain, shortness of breath, no abdominal or urinary concerns, no specific focal neurological symptoms. Pt also denies fevers, chills, no other systemic concerns. Ambulating well in hallway per nursing staff. [facility] consulted for further evaluation of acute renal failure, transaminitis, acute blood loss anemia post partum.   Pt appears to be clinically improving, will sign off for now and please call us if needed, call my cell phone [phone] (Dr. Izola PriceMyers) or page me at [phone].  Active Problems:  Acute blood loss anemia  - HG and Hct better this AM, no signs of active bleeding  Transaminitis  - unclear etiology, atypical presentation of pre eclampsia  - management per primary team  Acute renal failure  - FENa 2%  - renal US unremarkable, Cr is trending down, hopefully it will continue trending down  Hypokalemia  - continue to supplement as indicated   Procedures/Studies:  Renal US 02/09 -->  Antibiotics:  None  Data Reviewed: Basic Metabolic Panel:  Recent Labs Lab 03/01/13 0618 03/01/13 1307 03/02/13 0647 03/02/13 1552 03/02/13 2009 03/03/13 0040 03/03/13 0540 03/03/13 0803 03/03/13 1554  NA 140 141 142  --   --  139 140  --  137  K 2.9* 3.0* 3.0*  --   --  3.4* 3.4*  --  4.1  CL 105 104 105  --   --  100 99  --  98  CO2 21 24 26   --   --  25 28  --  27  GLUCOSE 92 80 93  --   --  122* 95  --  102*  BUN 7 7 6   --   --  4* 3*  --  3*  CREATININE 1.64* 1.60* 1.49*  --   --  1.19* 1.33*  --  1.26*  CALCIUM  6.8* 7.3* 7.2*  --   --  7.6* 8.2*  --  7.9*  MG  --   --   --  4.8* 6.2* 6.1* 5.7* 6.3*  --   PHOS  --  4.4  --   --   --   --   --   --   --    Liver Function Tests:  Recent Labs Lab 02/28/13 1205 03/01/13 0618 03/01/13 1307 03/02/13 0647 03/03/13 0040 03/03/13 0540  AST 75* 54*  --  57* 51* 49*  ALT 39* 26  --  32 36* 38*  ALKPHOS 220* 142*  --  138* 168* 185*  BILITOT 0.7 0.4  --  0.3 0.3 0.3  PROT 6.7 5.3*  --  5.6* 6.1 6.6  ALBUMIN 2.4* 1.7* 2.3* 1.9* 2.2* 2.4*   CBC:  Recent Labs Lab 02/28/13 1205 03/01/13 0618 03/02/13 0646 03/02/13 0647 03/03/13 0040 03/03/13 0540  WBC 10.7* 12.4*  --  9.2 9.9 9.7  NEUTROABS  --   --   --  6.3  --   --   HGB 9.3* 6.5*  --  6.0* 7.2* 7.9*  HCT 27.8* 19.3*  --  17.8* 21.6* 23.7*  MCV 79.4 80.1  --  80.2 80.3 80.3  PLT 289 234 241 242 307 356    MAGICK-Makya Yurko, Sherlon Handing, MD  [facility] Pager [phone]  If 7PM-7AM, please contact night-coverage [email or web address] Password [email or web address] 03/03/2013, 5:18 PM   LOS: 3 days

## 2013-03-03 NOTE — Progress Notes (Signed)
Post Partum Day 1  Subjective: No headaches, blurred vision, or RUQ pain.  Objective: Blood pressure 134/86, pulse 71, temperature 98.2 F (36.8 C), temperature source Oral, resp. rate 18, height 5\' 7"  (1.702 m), weight 126 lb 9.6 oz (57.425 kg), SpO2 100.00%, unknown if currently breastfeeding.  Physical Exam:  General: alert and no distress Lochia: appropriate Uterine Fundus: firm Incision: NA DVT Evaluation: No evidence of DVT seen on physical exam.   Recent Labs  03/03/13 0040 03/03/13 0540  HGB 7.2* 7.9*  HCT 21.6* 23.7*   Results for orders placed during the hospital encounter of 02/28/13 (from the past 24 hour(s))  MAGNESIUM     Status: Abnormal   Collection Time    03/02/13  8:09 PM      Result Value Range   Magnesium 6.2 (*) 1.5 - 2.5 mg/dL  MAGNESIUM     Status: Abnormal   Collection Time    03/03/13 12:40 AM      Result Value Range   Magnesium 6.1 (*) 1.5 - 2.5 mg/dL  CBC     Status: Abnormal   Collection Time    03/03/13 12:40 AM      Result Value Range   WBC 9.9  4.0 - 10.5 K/uL   RBC 2.69 (*) 3.87 - 5.11 MIL/uL   Hemoglobin 7.2 (*) 12.0 - 15.0 g/dL   HCT 69.621.6 (*) 29.536.0 - 28.446.0 %   MCV 80.3  78.0 - 100.0 fL   MCH 26.8  26.0 - 34.0 pg   MCHC 33.3  30.0 - 36.0 g/dL   RDW 13.216.0 (*) 44.011.5 - 10.215.5 %   Platelets 307  150 - 400 K/uL  COMPREHENSIVE METABOLIC PANEL     Status: Abnormal   Collection Time    03/03/13 12:40 AM      Result Value Range   Sodium 139  137 - 147 mEq/L   Potassium 3.4 (*) 3.7 - 5.3 mEq/L   Chloride 100  96 - 112 mEq/L   CO2 25  19 - 32 mEq/L   Glucose, Bld 122 (*) 70 - 99 mg/dL   BUN 4 (*) 6 - 23 mg/dL   Creatinine, Ser 7.251.19 (*) 0.50 - 1.10 mg/dL   Calcium 7.6 (*) 8.4 - 10.5 mg/dL   Total Protein 6.1  6.0 - 8.3 g/dL   Albumin 2.2 (*) 3.5 - 5.2 g/dL   AST 51 (*) 0 - 37 U/L   ALT 36 (*) 0 - 35 U/L   Alkaline Phosphatase 168 (*) 39 - 117 U/L   Total Bilirubin 0.3  0.3 - 1.2 mg/dL   GFR calc non Af Amer 64 (*) >90 mL/min   GFR  calc Af Amer 75 (*) >90 mL/min  MAGNESIUM     Status: Abnormal   Collection Time    03/03/13  5:40 AM      Result Value Range   Magnesium 5.7 (*) 1.5 - 2.5 mg/dL  CBC     Status: Abnormal   Collection Time    03/03/13  5:40 AM      Result Value Range   WBC 9.7  4.0 - 10.5 K/uL   RBC 2.95 (*) 3.87 - 5.11 MIL/uL   Hemoglobin 7.9 (*) 12.0 - 15.0 g/dL   HCT 36.623.7 (*) 44.036.0 - 34.746.0 %   MCV 80.3  78.0 - 100.0 fL   MCH 26.8  26.0 - 34.0 pg   MCHC 33.3  30.0 - 36.0 g/dL   RDW 42.516.0 (*) 95.611.5 -  15.5 %   Platelets 356  150 - 400 K/uL  COMPREHENSIVE METABOLIC PANEL     Status: Abnormal   Collection Time    03/03/13  5:40 AM      Result Value Range   Sodium 140  137 - 147 mEq/L   Potassium 3.4 (*) 3.7 - 5.3 mEq/L   Chloride 99  96 - 112 mEq/L   CO2 28  19 - 32 mEq/L   Glucose, Bld 95  70 - 99 mg/dL   BUN 3 (*) 6 - 23 mg/dL   Creatinine, Ser 9.60 (*) 0.50 - 1.10 mg/dL   Calcium 8.2 (*) 8.4 - 10.5 mg/dL   Total Protein 6.6  6.0 - 8.3 g/dL   Albumin 2.4 (*) 3.5 - 5.2 g/dL   AST 49 (*) 0 - 37 U/L   ALT 38 (*) 0 - 35 U/L   Alkaline Phosphatase 185 (*) 39 - 117 U/L   Total Bilirubin 0.3  0.3 - 1.2 mg/dL   GFR calc non Af Amer 56 (*) >90 mL/min   GFR calc Af Amer 65 (*) >90 mL/min  URIC ACID     Status: None   Collection Time    03/03/13  5:40 AM      Result Value Range   Uric Acid, Serum 6.5  2.4 - 7.0 mg/dL  MAGNESIUM     Status: Abnormal   Collection Time    03/03/13  8:03 AM      Result Value Range   Magnesium 6.3 (*) 1.5 - 2.5 mg/dL    Assessment/Plan:  Improved preeclampsia Not orthostatic  Contraception Undecided Bottle feeding. To the floor. Home in the morning.   LOS: 3 days   Jospeh Mangel V 03/03/2013, 4:16 PM

## 2013-03-03 NOTE — Progress Notes (Signed)
Awaiting on Dr. Stefano GaulStringer and [facility]

## 2013-03-03 NOTE — Progress Notes (Signed)
Critical lab value - magnesium sulfate 6.1 called to Dr. Su Hiltoberts - no new orders rec'd - will maintain magnesium sulfate at 1gm/hr.

## 2013-03-03 NOTE — Progress Notes (Signed)
[facility] Preparing for transfer to rm #[id number]

## 2013-03-03 NOTE — Progress Notes (Signed)
Post Partum Day 1 ICU Day 1  Subjective: no complaints, tolerating PO and The patient denies headaches and right upper quadrant tenderness.  Objective: Blood pressure 148/98, pulse 78, temperature 97.9 F (36.6 C), temperature source Oral, resp. rate 16, height 5\' 7"  (1.702 m), weight 126 lb 9.6 oz (57.425 kg), SpO2 100.00%, unknown if currently breastfeeding.  Physical Exam:  General: Lethargic. The patient says it is due to the magnesium. Lochia: appropriate Uterine Fundus: firm Incision: NA DVT Evaluation: No evidence of DVT seen on physical exam. Chest: Clear Heart: Regular rate and rhythm Reflexes: Normal  Results for orders placed during the hospital encounter of 02/28/13 (from the past 24 hour(s))  MRSA PCR SCREENING     Status: None   Collection Time    03/02/13 11:55 AM      Result Value Range   MRSA by PCR NEGATIVE  NEGATIVE  MAGNESIUM     Status: Abnormal   Collection Time    03/02/13  3:52 PM      Result Value Range   Magnesium 4.8 (*) 1.5 - 2.5 mg/dL  MAGNESIUM     Status: Abnormal   Collection Time    03/02/13  8:09 PM      Result Value Range   Magnesium 6.2 (*) 1.5 - 2.5 mg/dL  MAGNESIUM     Status: Abnormal   Collection Time    03/03/13 12:40 AM      Result Value Range   Magnesium 6.1 (*) 1.5 - 2.5 mg/dL  CBC     Status: Abnormal   Collection Time    03/03/13 12:40 AM      Result Value Range   WBC 9.9  4.0 - 10.5 K/uL   RBC 2.69 (*) 3.87 - 5.11 MIL/uL   Hemoglobin 7.2 (*) 12.0 - 15.0 g/dL   HCT 16.1 (*) 09.6 - 04.5 %   MCV 80.3  78.0 - 100.0 fL   MCH 26.8  26.0 - 34.0 pg   MCHC 33.3  30.0 - 36.0 g/dL   RDW 40.9 (*) 81.1 - 91.4 %   Platelets 307  150 - 400 K/uL  COMPREHENSIVE METABOLIC PANEL     Status: Abnormal   Collection Time    03/03/13 12:40 AM      Result Value Range   Sodium 139  137 - 147 mEq/L   Potassium 3.4 (*) 3.7 - 5.3 mEq/L   Chloride 100  96 - 112 mEq/L   CO2 25  19 - 32 mEq/L   Glucose, Bld 122 (*) 70 - 99 mg/dL   BUN 4  (*) 6 - 23 mg/dL   Creatinine, Ser 7.82 (*) 0.50 - 1.10 mg/dL   Calcium 7.6 (*) 8.4 - 10.5 mg/dL   Total Protein 6.1  6.0 - 8.3 g/dL   Albumin 2.2 (*) 3.5 - 5.2 g/dL   AST 51 (*) 0 - 37 U/L   ALT 36 (*) 0 - 35 U/L   Alkaline Phosphatase 168 (*) 39 - 117 U/L   Total Bilirubin 0.3  0.3 - 1.2 mg/dL   GFR calc non Af Amer 64 (*) >90 mL/min   GFR calc Af Amer 75 (*) >90 mL/min  MAGNESIUM     Status: Abnormal   Collection Time    03/03/13  5:40 AM      Result Value Range   Magnesium 5.7 (*) 1.5 - 2.5 mg/dL  CBC     Status: Abnormal   Collection Time    03/03/13  5:40 AM      Result Value Range   WBC 9.7  4.0 - 10.5 K/uL   RBC 2.95 (*) 3.87 - 5.11 MIL/uL   Hemoglobin 7.9 (*) 12.0 - 15.0 g/dL   HCT 09.823.7 (*) 11.936.0 - 14.746.0 %   MCV 80.3  78.0 - 100.0 fL   MCH 26.8  26.0 - 34.0 pg   MCHC 33.3  30.0 - 36.0 g/dL   RDW 82.916.0 (*) 56.211.5 - 13.015.5 %   Platelets 356  150 - 400 K/uL  COMPREHENSIVE METABOLIC PANEL     Status: Abnormal   Collection Time    03/03/13  5:40 AM      Result Value Range   Sodium 140  137 - 147 mEq/L   Potassium 3.4 (*) 3.7 - 5.3 mEq/L   Chloride 99  96 - 112 mEq/L   CO2 28  19 - 32 mEq/L   Glucose, Bld 95  70 - 99 mg/dL   BUN 3 (*) 6 - 23 mg/dL   Creatinine, Ser 8.651.33 (*) 0.50 - 1.10 mg/dL   Calcium 8.2 (*) 8.4 - 10.5 mg/dL   Total Protein 6.6  6.0 - 8.3 g/dL   Albumin 2.4 (*) 3.5 - 5.2 g/dL   AST 49 (*) 0 - 37 U/L   ALT 38 (*) 0 - 35 U/L   Alkaline Phosphatase 185 (*) 39 - 117 U/L   Total Bilirubin 0.3  0.3 - 1.2 mg/dL   GFR calc non Af Amer 56 (*) >90 mL/min   GFR calc Af Amer 65 (*) >90 mL/min  URIC ACID     Status: None   Collection Time    03/03/13  5:40 AM      Result Value Range   Uric Acid, Serum 6.5  2.4 - 7.0 mg/dL  MAGNESIUM     Status: Abnormal   Collection Time    03/03/13  8:03 AM      Result Value Range   Magnesium 6.3 (*) 1.5 - 2.5 mg/dL     Recent Labs  78/46/9600/11/05 0040 03/03/13 0540  HGB 7.2* 7.9*  HCT 21.6* 23.7*     Assessment/Plan:  Postpartum day 1 Intensive care unit day 1 on magnesium Preeclampsia Excellent urine output Anemia  We will discontinue the magnesium after 24 hours.  We will repeat her laboratory studies prior to transferring out of the unit. Check orthostatic blood pressures and pulses with the patient is able to ambulate.   LOS: 3 days   Karla Pavone V 03/03/2013, 10:35 AM

## 2013-03-04 ENCOUNTER — Other Ambulatory Visit: Payer: Self-pay | Admitting: Obstetrics and Gynecology

## 2013-03-04 MED ORDER — FERROUS SULFATE 325 (65 FE) MG PO TABS: 325.0000 mg | ORAL_TABLET | Freq: Two times a day (BID) | ORAL | Status: DC

## 2013-03-04 MED ORDER — HYDROMORPHONE HCL 2 MG PO TABS: 2.0000 mg | ORAL_TABLET | ORAL | Status: DC | PRN

## 2013-03-04 NOTE — Discharge Summary (Signed)
Vaginal Delivery Discharge Summary  [name]  DOB:    [birth year] MRN:    [id number] CSN:    [id number]  Date of admission:                  02/28/13  Date of discharge:                   03/04/13  Procedures this admission:  NSVB, 24 hour of magnesium sulfate  Date of Delivery: 02/28/13  Newborn Data:  Live born female  Birth Weight: 6 lb 15.5 oz (3161 g) APGAR: 8, 9  Home with mother.  Circumcision Plan: Outpatient  History of Present Illness:  [name] is a 23 y.o. female, G1P1001, who presents at [redacted]w[redacted]d weeks gestation. The patient has been followed at the [facility] and Gynecology division of [facility]. She was admitted onset of labor. Her pregnancy has been complicated by:  Patient Active Problem List   Diagnosis Date Noted  . Vaginal delivery 03/04/2013  . Late prenatal care 10/25/2012  . 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 Anemia 10/25/2012  . ? 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 Seizure disorder 10/25/2012  . Asthma, chronic 10/25/2012  . Trichomonas 10/25/2012     Hospital course:  The patient was admitted for labor.   Her labor was not complicated. She proceeded to have a vaginal delivery of a healthy infant. Her delivery was not complicated. Her postpartum course was complicated by mildly elevated BPs and abnormal labs--elevated LFTs, LDH, uric acid, PCR, and creatnine.  She was placed on magnesium sulfate, with consult from Internal Medicine and Oncology. Creatnine reached a max 1.64 on 2/8, then decreased to 1.26 on 03/03/09. She had a normal renal scan on 03/02/13.  Her Hgb was 9.3 on day 1, then down to nadir of 6 on 03/02/13, with rebound to 7.9 on 03/03/13.   She was orthostatically stable and asymptomatic, and declined transfusion.  She was discharged to home on postpartum day 4 doing well.  She was using Dilaudid for pain with  benefit.  Feeding:  bottle  Contraception:  Undecided  Discharge hemoglobin:  Hemoglobin  Date Value Ref Range Status  03/03/2013 7.9* 12.0 - 15.0 g/dL Final     HCT  Date Value Ref Range Status  03/03/2013 23.7* 36.0 - 46.0 % Final    Discharge Physical Exam:   General: alert Lochia: appropriate Uterine Fundus: firm Incision: healing well DVT Evaluation: No evidence of DVT seen on physical exam. Negative Homan's sign.  Intrapartum Procedures: spontaneous vaginal delivery Postpartum Procedures: none Complications-Operative and Postpartum: 1st degree perineal laceration  Discharge Diagnoses: Term Pregnancy-delivered, Pre-eclampsia, elevated creatnine, anemia without hemodynamic instability.  Discharge Information:  Activity:           Per [facility] handout Diet:                routine Medications: Iron and Dilaudid Condition:      stable Instructions:   Postpartum Care After Vaginal Delivery  After you deliver your newborn (postpartum period), the usual stay in the hospital is 24 72 hours. If there were problems with your labor or delivery, or if you have other medical problems, you might be in the hospital longer.  While you are in the hospital, you will receive help and instructions on how to care for yourself and your newborn during the postpartum period.  While you are in the hospital:  Be sure to  tell your nurses if you have pain or discomfort, as well as where you feel the pain and what makes the pain worse.  If you had an incision made near your vagina (episiotomy) or if you had some tearing during delivery, the nurses may put ice packs on your episiotomy or tear. The ice packs may help to reduce the pain and swelling.  If you are breastfeeding, you may feel uncomfortable contractions of your uterus for a couple of weeks. This is normal. The contractions help your uterus get back to normal size.  It is normal to have some bleeding after delivery.  For the  first 1 3 days after delivery, the flow is red and the amount may be similar to a period.  It is common for the flow to start and stop.  In the first few days, you may pass some small clots. Let your nurses know if you begin to pass large clots or your flow increases.  Do not  flush blood clots down the toilet before having the nurse look at them.  During the next 3 10 days after delivery, your flow should become more watery and pink or brown-tinged in color.  Ten to fourteen days after delivery, your flow should be a small amount of yellowish-white discharge.  The amount of your flow will decrease over the first few weeks after delivery. Your flow may stop in 6 8 weeks. Most women have had their flow stop by 12 weeks after delivery.  You should change your sanitary pads frequently.  Wash your hands thoroughly with soap and water for at least 20 seconds after changing pads, using the toilet, or before holding or feeding your newborn.  You should feel like you need to empty your bladder within the first 6 8 hours after delivery.  In case you become weak, lightheaded, or faint, call your nurse before you get out of bed for the first time and before you take a shower for the first time.  Within the first few days after delivery, your breasts may begin to feel tender and full. This is called engorgement. Breast tenderness usually goes away within 48 72 hours after engorgement occurs. You may also notice milk leaking from your breasts. If you are not breastfeeding, do not stimulate your breasts. Breast stimulation can make your breasts produce more milk.  Spending as much time as possible with your newborn is very important. During this time, you and your newborn can feel close and get to know each other. Having your newborn stay in your room (rooming in) will help to strengthen the bond with your newborn. It will give you time to get to know your newborn and become comfortable caring for your  newborn.  Your hormones change after delivery. Sometimes the hormone changes can temporarily cause you to feel sad or tearful. These feelings should not last more than a few days. If these feelings last longer than that, you should talk to your caregiver.  If desired, talk to your caregiver about methods of family planning or contraception.  Talk to your caregiver about immunizations. Your caregiver may want you to have the following immunizations before leaving the hospital:  Tetanus, diphtheria, and pertussis (Tdap) or tetanus and diphtheria (Td) immunization. It is very important that you and your family (including grandparents) or others caring for your newborn are up-to-date with the Tdap or Td immunizations. The Tdap or Td immunization can help protect your newborn from getting ill.  Rubella immunization.  Varicella (chickenpox) immunization.  Influenza immunization. You should receive this annual immunization if you did not receive the immunization during your pregnancy. Document Released: 11/05/2006 Document Revised: 10/03/2011 Document Reviewed: 09/05/2011 [facility] Patient Information 2014 [place].   Postpartum Depression and Baby Blues  The postpartum period begins right after the birth of a baby. During this time, there is often a great amount of joy and excitement. It is also a time of considerable changes in the life of the parent(s). Regardless of how many times a mother gives birth, each child brings new challenges and dynamics to the family. It is not unusual to have feelings of excitement accompanied by confusing shifts in moods, emotions, and thoughts. All mothers are at risk of developing postpartum depression or the "baby blues." These mood changes can occur right after giving birth, or they may occur many months after giving birth. The baby blues or postpartum depression can be mild or severe. Additionally, postpartum depression can resolve rather quickly, or it can  be a long-term condition. CAUSES Elevated hormones and their rapid decline are thought to be a main cause of postpartum depression and the baby blues. There are a number of hormones that radically change during and after pregnancy. Estrogen and progesterone usually decrease immediately after delivering your baby. The level of thyroid hormone and various cortisol steroids also rapidly drop. Other factors that play a major role in these changes include major life events and genetics.  RISK FACTORS If you have any of the following risks for the baby blues or postpartum depression, know what symptoms to watch out for during the postpartum period. Risk factors that may increase the likelihood of getting the baby blues or postpartum depression include:  Havinga personal or family history of depression.  Having depression while being pregnant.  Having premenstrual or oral contraceptive-associated mood issues.  Having exceptional life stress.  Having marital conflict.  Lacking a social support network.  Having a baby with special needs.  Having health problems such as diabetes. SYMPTOMS Baby blues symptoms include:  Brief fluctuations in mood, such as going from extreme happiness to sadness.  Decreased concentration.  Difficulty sleeping.  Crying spells, tearfulness.  Irritability.  Anxiety. Postpartum depression symptoms typically begin within the first month after giving birth. These symptoms include:  Difficulty sleeping or excessive sleepiness.  Marked weight loss.  Agitation.  Feelings of worthlessness.  Lack of interest in activity or food. Postpartum psychosis is a very concerning condition and can be dangerous. Fortunately, it is rare. Displaying any of the following symptoms is cause for immediate medical attention. Postpartum psychosis symptoms include:  Hallucinations and delusions.  Bizarre or disorganized behavior.  Confusion or disorientation. DIAGNOSIS  A  diagnosis is made by an evaluation of your symptoms. There are no medical or lab tests that lead to a diagnosis, but there are various questionnaires that a caregiver may use to identify those with the baby blues, postpartum depression, or psychosis. Often times, a screening tool called the [place] Postnatal Depression Scale is used to diagnose depression in the postpartum period.  TREATMENT The baby blues usually goes away on its own in 1 to 2 weeks. Social support is often all that is needed. You should be encouraged to get adequate sleep and rest. Occasionally, you may be given medicines to help you sleep.  Postpartum depression requires treatment as it can last several months or longer if it is not treated. Treatment may include individual or group therapy, medicine, or both to address  any social, physiological, and psychological factors that may play a role in the depression. Regular exercise, a healthy diet, rest, and social support may also be strongly recommended.  Postpartum psychosis is more serious and needs treatment right away. Hospitalization is often needed. HOME CARE INSTRUCTIONS  Get as much rest as you can. Nap when the baby sleeps.  Exercise regularly. Some women find yoga and walking to be beneficial.  Eat a balanced and nourishing diet.  Do little things that you enjoy. Have a cup of tea, take a bubble bath, read your favorite magazine, or listen to your favorite music.  Avoid alcohol.  Ask for help with household chores, cooking, grocery shopping, or running errands as needed. Do not try to do everything.  Talk to people close to you about how you are feeling. Get support from your partner, family members, friends, or other new moms.  Try to stay positive in how you think. Think about the things you are grateful for.  Do not spend a lot of time alone.  Only take medicine as directed by your caregiver.  Keep all your postpartum appointments.  Let your caregiver  know if you have any concerns. SEEK MEDICAL CARE IF: You are having a reaction or problems with your medicine. SEEK IMMEDIATE MEDICAL CARE IF:  You have suicidal feelings.  You feel you may harm the baby or someone else. Document Released: 10/13/2003 Document Revised: 04/02/2011 Document Reviewed: 11/14/2010 [facility] Patient Information 2014 [place], [place].   Discharge to: home--will have a visit at [facility] in 2 weeks for recheck of status, possible labs.  Follow-up Information   Follow up with [facility]. Schedule an appointment as soon as possible for a visit in 6 weeks. (Call for any questions or concerns.)    Specialty:  Obstetrics and Gynecology   Contact information:   [address] [place] [address] [phone]       Nigel Bridgeman 03/04/2013

## 2013-03-04 NOTE — Discharge Instructions (Signed)

## 2013-11-23 ENCOUNTER — Encounter (HOSPITAL_COMMUNITY): Payer: Self-pay | Admitting: *Deleted

## 2014-08-06 IMAGING — US US ABDOMEN COMPLETE
1 series · 13 of 25 positions shown · non-contrast
Comparison: Abdominal ultrasound 07/22/2010.

CLINICAL DATA: Left lower quadrant abdominal pain.

COMPLETE ABDOMINAL ULTRASOUND

[Series 1: us abdomen complete · 13 of 93 slices shown]
[im 1/93]
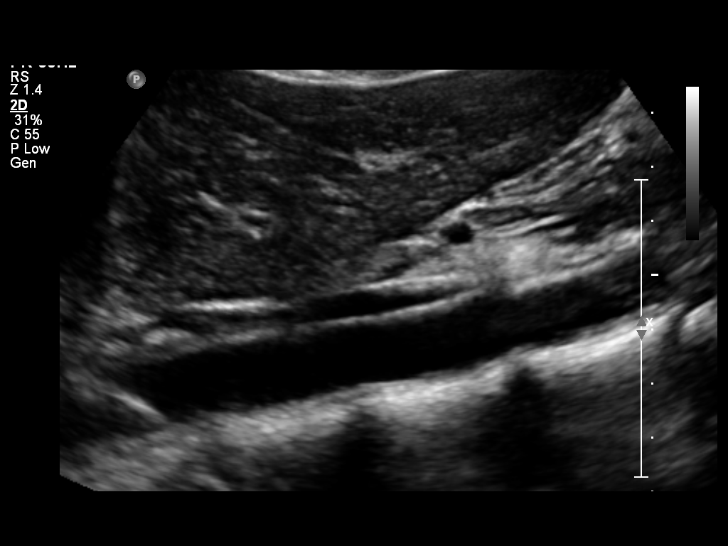
[im 8/93]
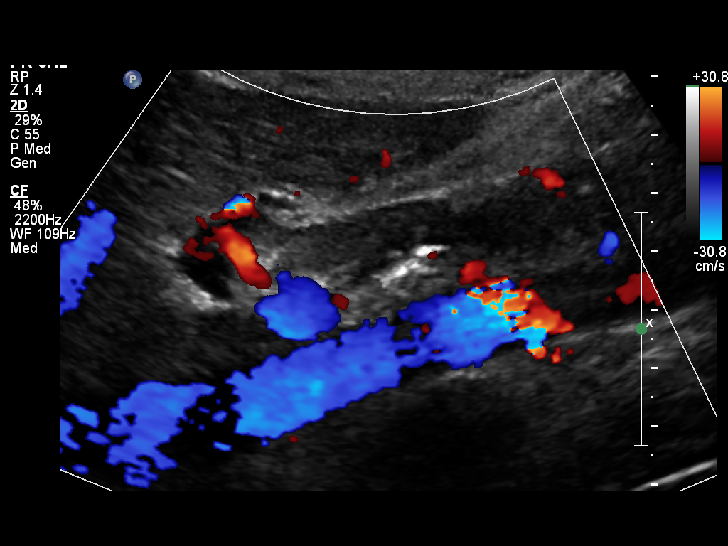
[im 16/93]
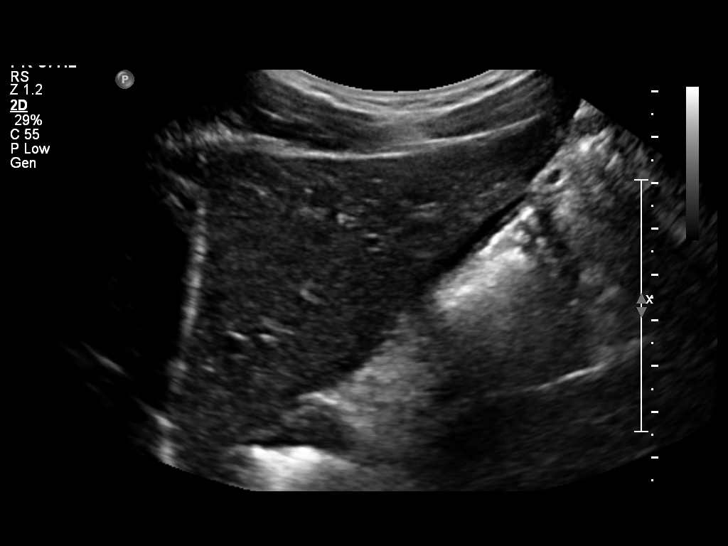
[im 24/93]
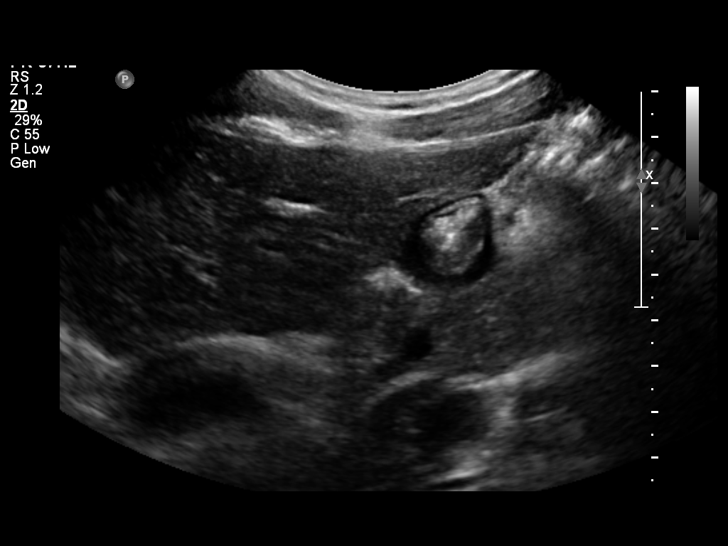
[im 31/93]
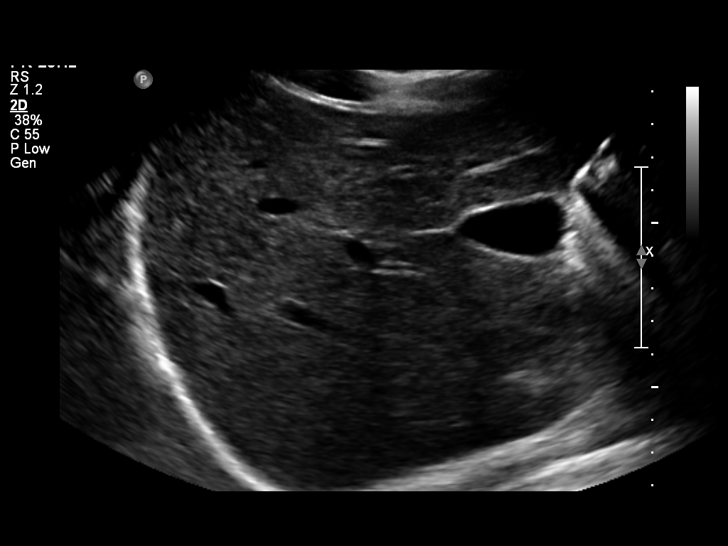
[im 39/93]
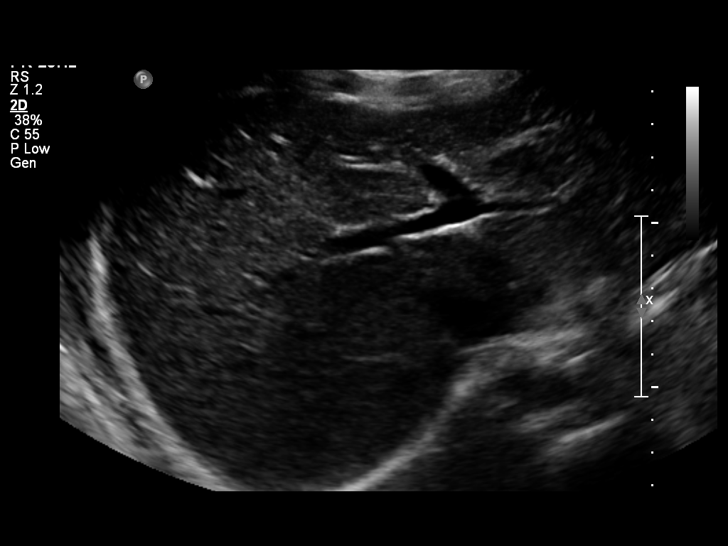
[im 47/93]
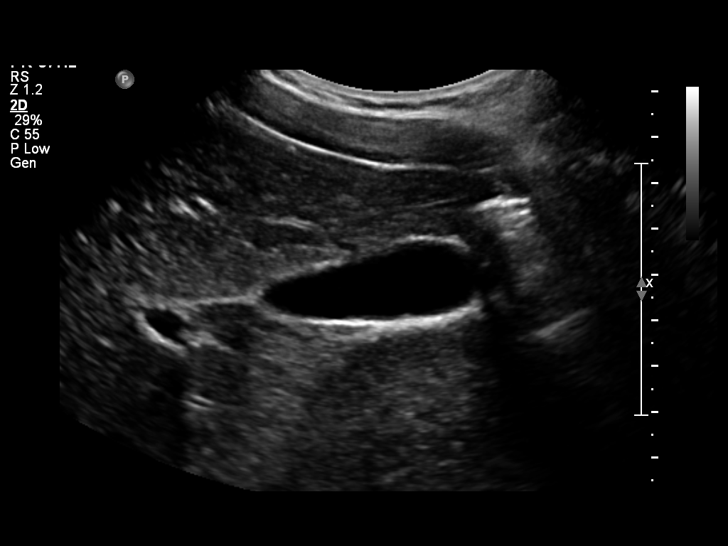
[im 54/93]
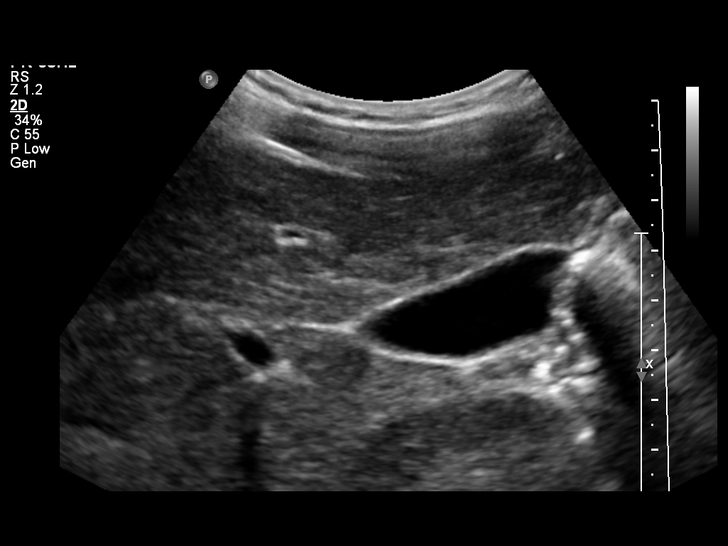
[im 62/93]
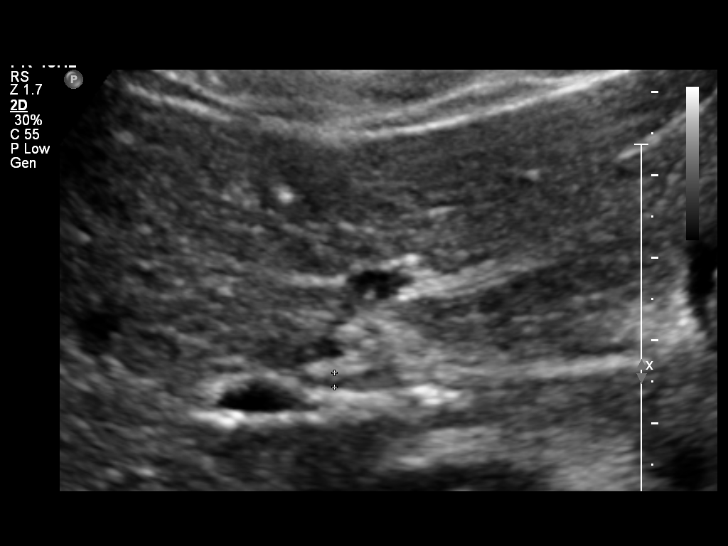
[im 70/93]
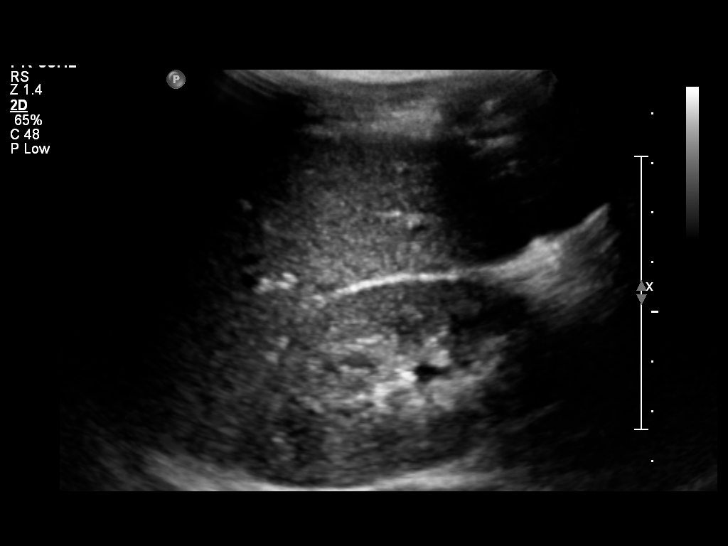
[im 77/93]
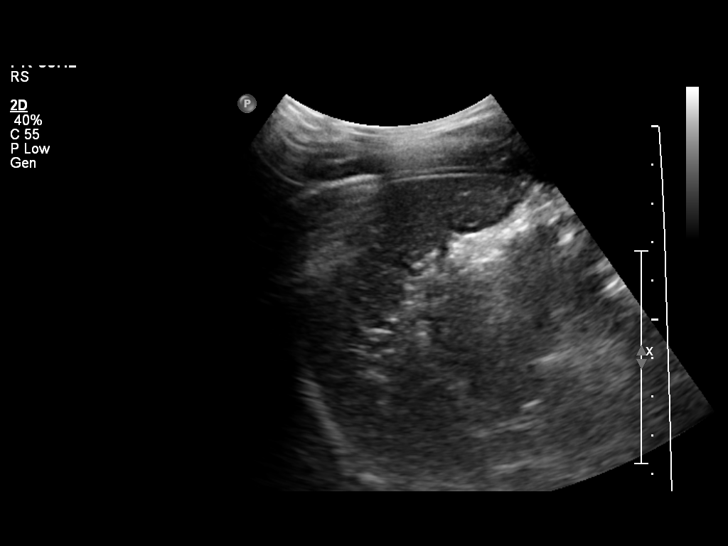
[im 85/93]
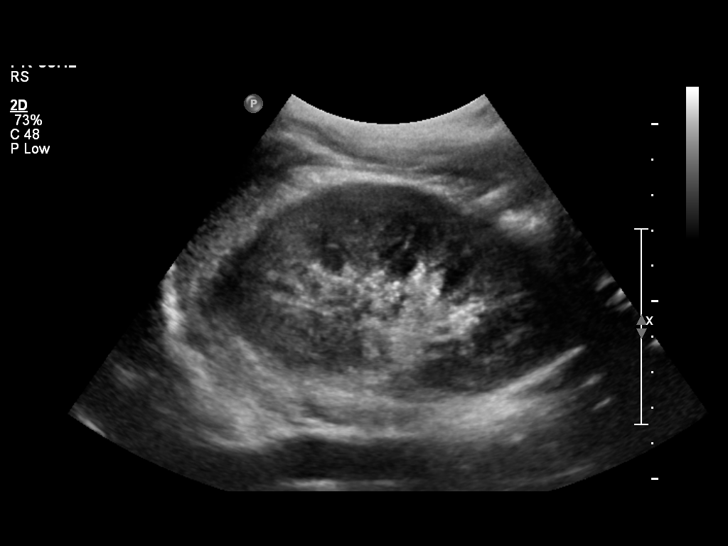
[im 93/93]
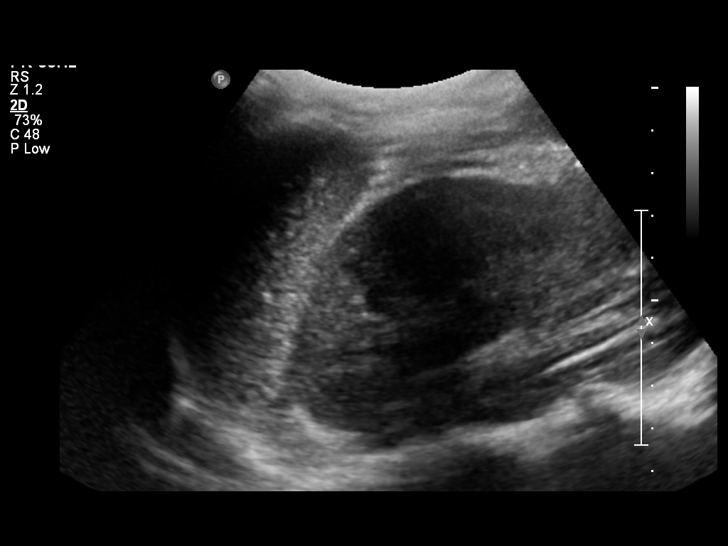

[13 of 25 positions shown; findings below may reference images not displayed]

FINDINGS: Gallbladder:  No shadowing gallstones or echogenic sludge.  No
gallbladder wall thickening or pericholecystic fluid.  Negative
sonographic Murphy's sign according to the ultrasound technologist.

Common bile duct:  Normal caliber measuring 2 mm in the porta
hepatis.

Liver:  No focal mass lesion seen.  Within normal limits in
parenchymal echogenicity.

IVC:  Patent throughout its visualized course in the abdomen.

Pancreas:  Although the pancreas is difficult to visualize in its
entirety, no focal pancreatic abnormality is identified.

Spleen:  Normal size and echotexture without focal parenchymal
abnormality.5.9 cm in length.

Right Kidney:  No hydronephrosis.  Well-preserved cortex.  Normal
size and parenchymal echotexture without focal abnormalities.
cm in length.

Left Kidney:  No hydronephrosis.  Well-preserved cortex.  Normal
size and parenchymal echotexture without focal abnormalities.
cm in length.

Abdominal aorta:  Normal caliber measuring 1.6 cm in diameter
proximally, and tapering appropriately distally.
IMPRESSION: Negative abdominal ultrasound.

## 2014-08-16 IMAGING — US US OB TRANSVAGINAL
2 series · 13 of 28 positions shown · non-contrast
Comparison: none

[Series 1: us ob transvaginal · 32 acquisitions, 12 frames shown (1 of 2)]
[im 2/32]
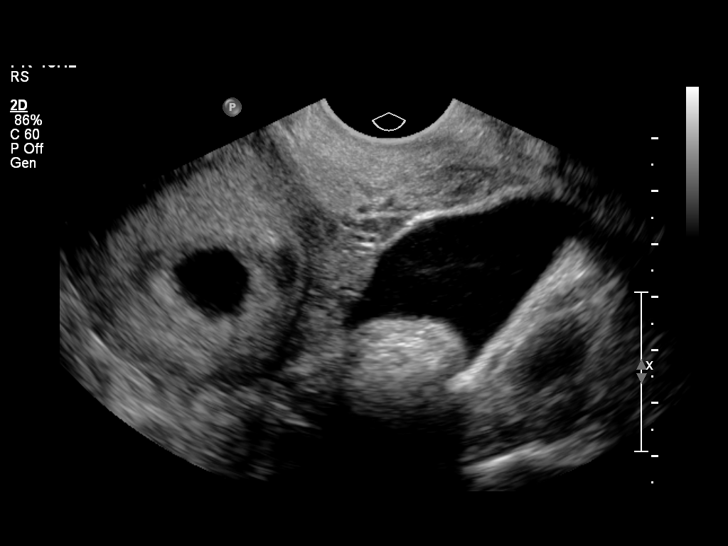
[im 4/32]
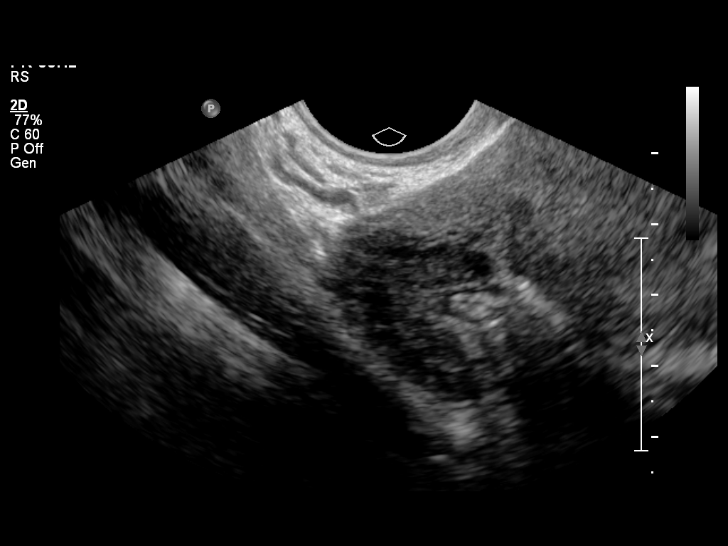
[im 7/32]
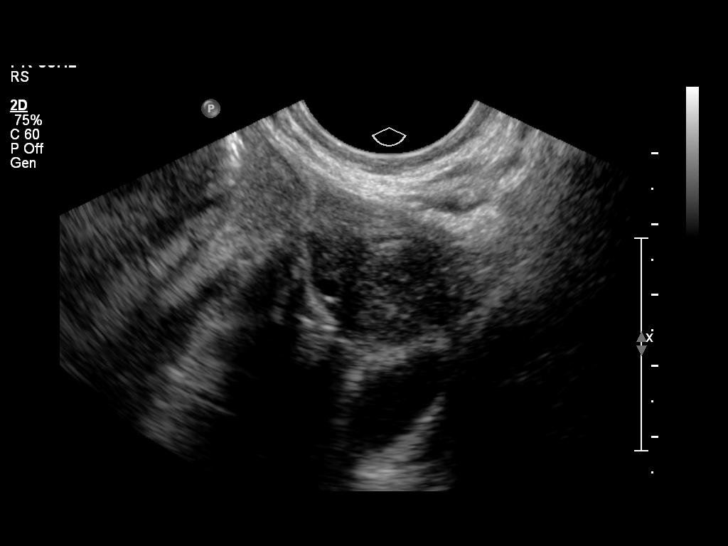
[im 10/32]
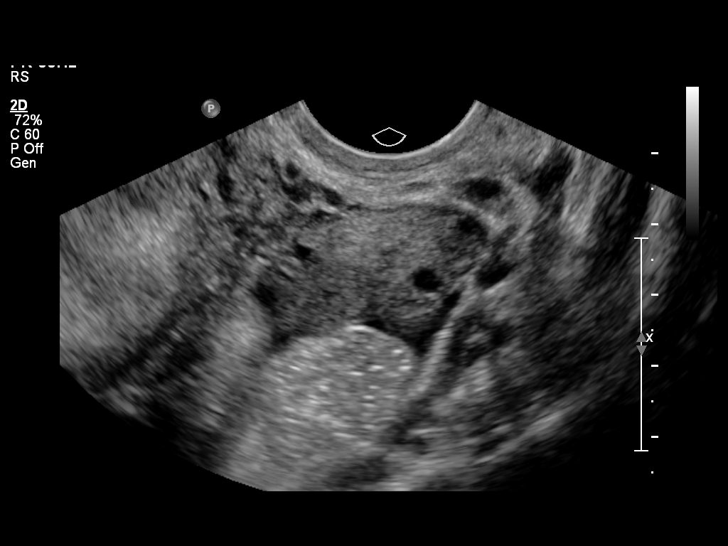
[im 12/32]
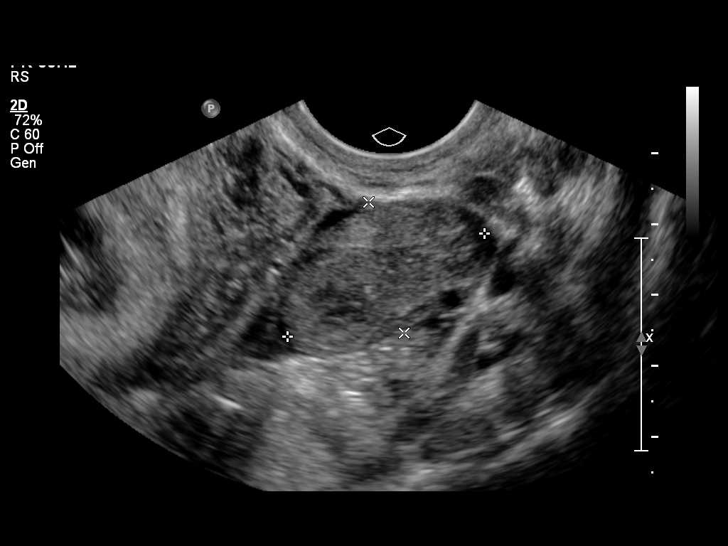
[im 15/32]
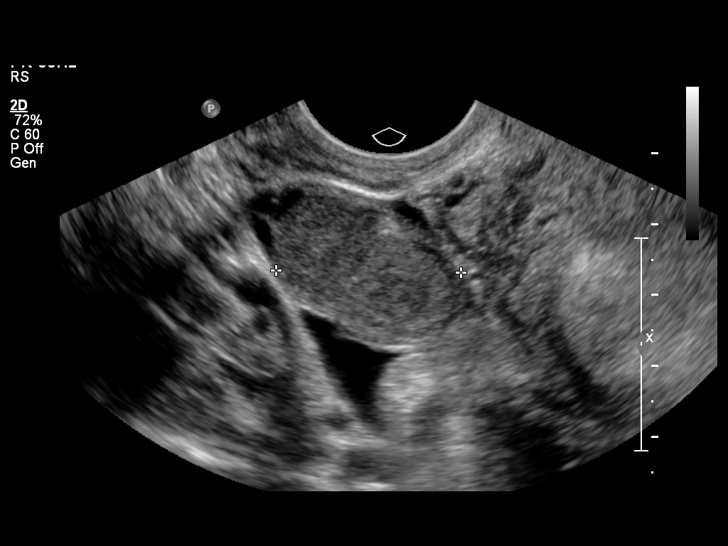
[im 19/32]
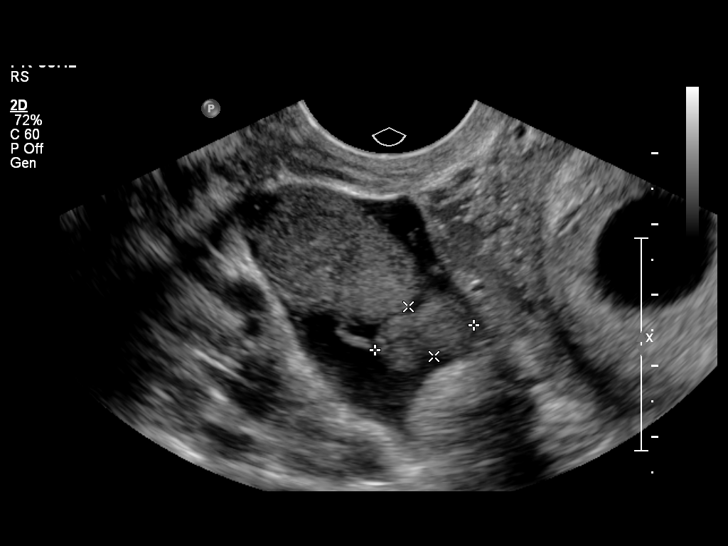
[im 21/32]
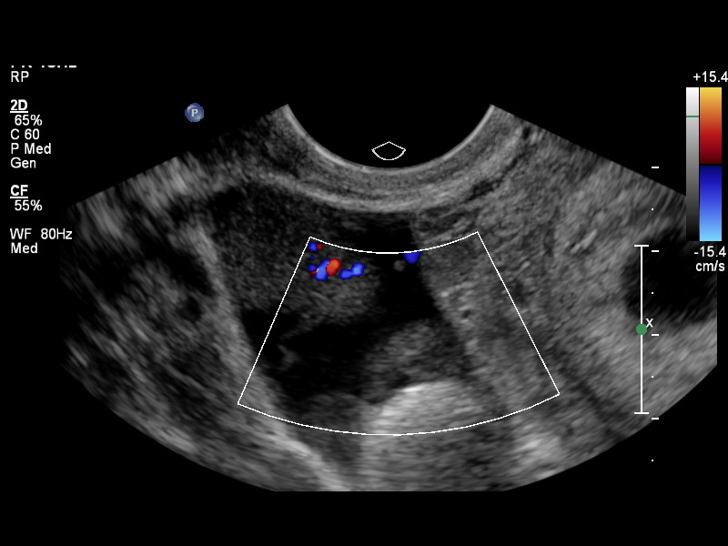
[im 24/32]
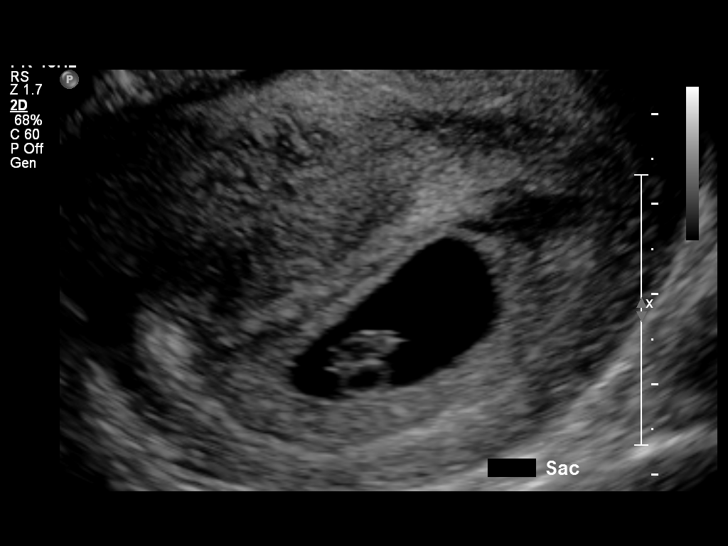
[im 26/32]
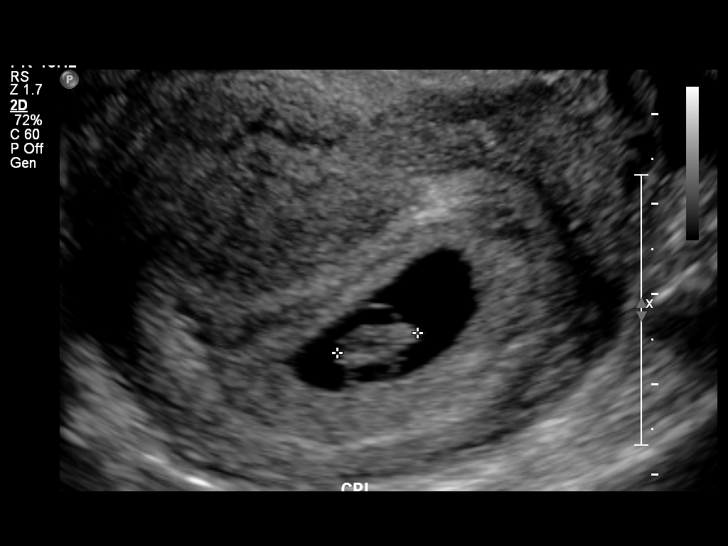
[im 29/32]
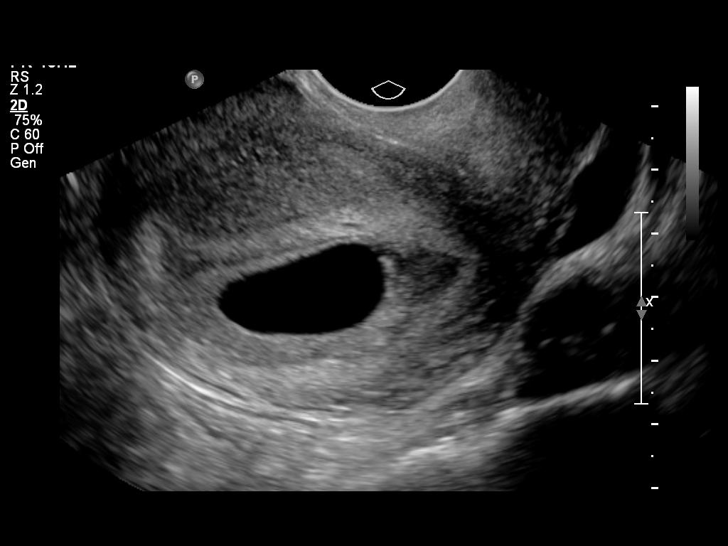
[im 32/32]
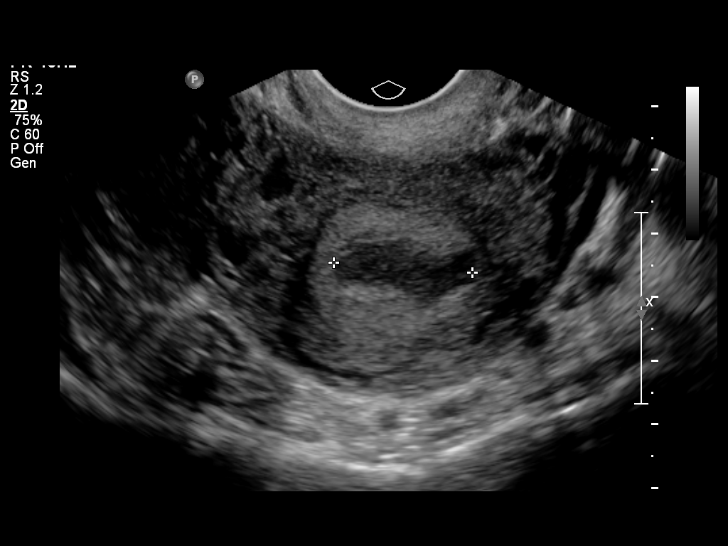

[Series 1: us ob transvaginal · 4 acquisitions, 1 frame shown (2 of 2)]
[im 2/4]
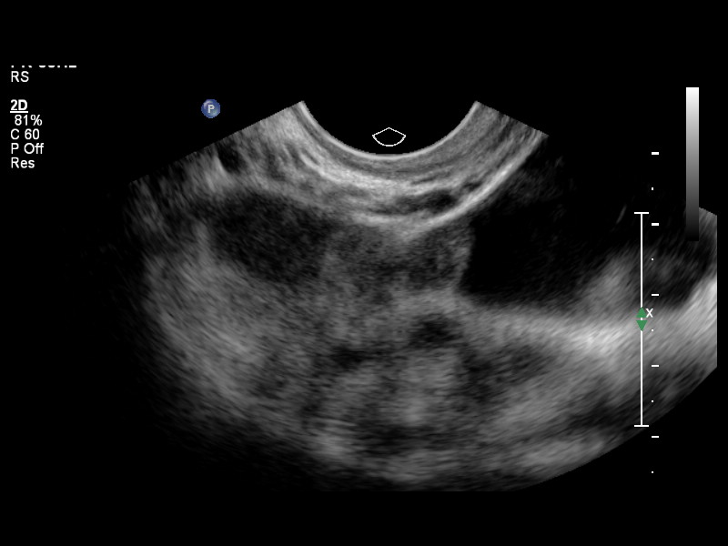

[13 of 28 positions shown; findings below may reference images not displayed]

OBSTETRICS REPORT
                      (Signed Final 07/21/2012 [DATE])

Service(s) Provided

 US OB TRANSVAGINAL                                    76817.0
Indications

 Pregnancy with inconclusive fetal viability
 Pain - Abdominal/Pelvic
Fetal Evaluation

 Num Of Fetuses:    1
 Preg. Location:    Intrauterine
 Gest. Sac:         Intrauterine, small
                    subchorionic bleed
 Yolk Sac:          Visualized
 Fetal Pole:        Visualized
 Fetal Heart Rate:  137                         bpm
 Cardiac Activity:  Observed
Biometry

 CRL:      9.8  mm    G. Age:   7w 0d                  EDD:   03/09/13
Gestational Age

 Best:          7w 0d     Det. By:   U/S C R L (07/21/12)     EDD:   03/09/13
Cervix Uterus Adnexa

 Cul De Sac:   Moderate amount of free fluid seen.
 Left Ovary:   Size(cm) L: 2.63 x W: 2.22 x H: 1.67  Volume(cc):
 Right Ovary:  Size(cm) L: 3.14 x W: 2.61 x H: 1.91  Volume(cc):
Impression

 Appropriate interval growth of intrauterine fetal pole with
 cardiac activity. Probable small amount of clot associated
 with right ovarian corpus luteum incidentally noted.
 Moderate free fluid noted.

## 2014-11-01 ENCOUNTER — Emergency Department (HOSPITAL_COMMUNITY)
Admission: EM | Admit: 2014-11-01 | Discharge: 2014-11-01 | Discharge status: Against Medical Advice | Payer: Medicaid Other | Attending: Emergency Medicine | Admitting: Emergency Medicine

## 2014-11-01 ENCOUNTER — Encounter (HOSPITAL_COMMUNITY): Payer: Self-pay | Admitting: Emergency Medicine

## 2014-11-01 DIAGNOSIS — J45909 Unspecified asthma, uncomplicated: Secondary | ICD-10-CM | POA: Diagnosis not present

## 2014-11-01 DIAGNOSIS — R1084 Generalized abdominal pain: Secondary | ICD-10-CM | POA: Diagnosis present

## 2014-11-01 LAB — CBC
HEMATOCRIT: 37.6 % (ref 36.0–46.0)
HEMOGLOBIN: 12 g/dL (ref 12.0–15.0)
MCH: 24.9 pg — ABNORMAL LOW (ref 26.0–34.0)
MCHC: 31.9 g/dL (ref 30.0–36.0)
MCV: 78 fL (ref 78.0–100.0)
Platelets: 279 10*3/uL (ref 150–400)
RBC: 4.82 MIL/uL (ref 3.87–5.11)
RDW: 14.8 % (ref 11.5–15.5)
WBC: 5 10*3/uL (ref 4.0–10.5)

## 2014-11-01 LAB — COMPREHENSIVE METABOLIC PANEL
ALBUMIN: 3.8 g/dL (ref 3.5–5.0)
ALT: 11 U/L — ABNORMAL LOW (ref 14–54)
ANION GAP: 9 (ref 5–15)
AST: 25 U/L (ref 15–41)
Alkaline Phosphatase: 80 U/L (ref 38–126)
BILIRUBIN TOTAL: 0.4 mg/dL (ref 0.3–1.2)
BUN: 8 mg/dL (ref 6–20)
CHLORIDE: 100 mmol/L — AB (ref 101–111)
CO2: 26 mmol/L (ref 22–32)
Calcium: 9.3 mg/dL (ref 8.9–10.3)
Creatinine, Ser: 1.2 mg/dL — ABNORMAL HIGH (ref 0.44–1.00)
GFR calc Af Amer: >60 mL/min (ref >60)
GLUCOSE: 113 mg/dL — AB (ref 65–99)
POTASSIUM: 3.7 mmol/L (ref 3.5–5.1)
Sodium: 135 mmol/L (ref 135–145)
TOTAL PROTEIN: 7.9 g/dL (ref 6.5–8.1)

## 2014-11-01 LAB — LIPASE, BLOOD: Lipase: 30 U/L (ref 22–51)

## 2014-11-01 LAB — I-STAT BETA HCG BLOOD, ED (MC, WL, AP ONLY): I-stat hCG, quantitative: <5 m[IU]/mL (ref <5)

## 2014-11-01 NOTE — ED Notes (Signed)
Pt called twice for room placement again with no answer

## 2014-11-01 NOTE — ED Notes (Signed)
Pt from home with c/o generalized sharp abdominal pain since yesterday.  Denies N/V/D, fever, or any new foods.  Pt in NAD, A&O.

## 2014-11-01 NOTE — ED Notes (Signed)
Pt called twice for room placement with no answer

## 2015-03-26 ENCOUNTER — Inpatient Hospital Stay (HOSPITAL_COMMUNITY)
Admission: AD | Admit: 2015-03-26 | Discharge: 2015-03-26 | Disposition: A | Discharge status: Home / Self Care | Payer: Medicaid Other | Source: Ambulatory Visit | Attending: Family Medicine | Admitting: Family Medicine

## 2015-03-26 DIAGNOSIS — Z3202 Encounter for pregnancy test, result negative: Secondary | ICD-10-CM | POA: Diagnosis not present

## 2015-03-26 DIAGNOSIS — R110 Nausea: Secondary | ICD-10-CM | POA: Diagnosis present

## 2015-03-26 LAB — URINALYSIS, ROUTINE W REFLEX MICROSCOPIC
BILIRUBIN URINE: NEGATIVE
GLUCOSE, UA: NEGATIVE mg/dL
KETONES UR: 15 mg/dL — AB
LEUKOCYTES UA: NEGATIVE
Nitrite: NEGATIVE
PROTEIN: 100 mg/dL — AB
Specific Gravity, Urine: 1.025 (ref 1.005–1.030)
pH: 6 (ref 5.0–8.0)

## 2015-03-26 LAB — URINE MICROSCOPIC-ADD ON: Bacteria, UA: NONE SEEN

## 2015-03-26 LAB — POCT PREGNANCY, URINE: Preg Test, Ur: NEGATIVE

## 2015-03-26 NOTE — MAU Provider Note (Signed)
S:  [name] is 25 y.o. female G1P1001 presenting to MAU with occasional sharp, lower abdominal pain and nausea. The patient thinks she may be pregnant. Symptoms have been present for 1 week. She denies pain at this time. LMP 2/7.  O:  GENERAL: Well-developed, well-nourished female in no acute distress.  LUNGS: Effort normal SKIN: Warm, dry and without erythema PSYCH: Normal mood and affect  Filed Vitals:   03/26/15 1731  BP: 113/73  Pulse: 94  Temp: 98.2 F (36.8 C)  TempSrc: Oral  Resp: 18  Height: 5' 7.25" (1.708 m)  Weight: 117 lb (53.071 kg)   Results for orders placed or performed during the hospital encounter of 03/26/15 (from the past 48 hour(s))  Urinalysis, Routine w reflex microscopic ([facility])     Status: Abnormal   Collection Time: 03/26/15  5:35 PM  Result Value Ref Range   Color, Urine YELLOW YELLOW   APPearance CLEAR CLEAR   Specific Gravity, Urine 1.025 1.005 - 1.030   pH 6.0 5.0 - 8.0   Glucose, UA NEGATIVE NEGATIVE mg/dL   Hgb urine dipstick TRACE (A) NEGATIVE   Bilirubin Urine NEGATIVE NEGATIVE   Ketones, ur 15 (A) NEGATIVE mg/dL   Protein, ur 469100 (A) NEGATIVE mg/dL   Nitrite NEGATIVE NEGATIVE   Leukocytes, UA NEGATIVE NEGATIVE  Urine microscopic-add on     Status: Abnormal   Collection Time: 03/26/15  5:35 PM  Result Value Ref Range   Squamous Epithelial / LPF 0-5 (A) NONE SEEN   WBC, UA 6-30 0 - 5 WBC/hpf   RBC / HPF 0-5 0 - 5 RBC/hpf   Bacteria, UA NONE SEEN NONE SEEN   Urine-Other MUCOUS PRESENT   Pregnancy, urine POC     Status: None   Collection Time: 03/26/15  5:45 PM  Result Value Ref Range   Preg Test, Ur NEGATIVE NEGATIVE    Comment:        THE SENSITIVITY OF THIS METHODOLOGY IS >24 mIU/mL     MDM:  Urine pregnancy test negative Patient notified of urine pregnancy test results and UA results. Patient says that she was here for a pregnancy test and feels ready to go home. She is not interested in being evaluated  for the nausea.   A:  1. Nausea   2. Encounter for pregnancy test with result negative     P:  Discharge home in stable condition At home pregnancy tests encouraged Return to MAU for emergencies   Duane LopeJennifer I Rasch, NP 03/26/2015 7:01 PM

## 2015-03-26 NOTE — MAU Note (Signed)
Patient has been having nausea x 3 days and having sharp pain whenever she eats.  LMP 03/01/15

## 2016-02-10 ENCOUNTER — Emergency Department (HOSPITAL_COMMUNITY): Payer: Medicaid Other

## 2016-02-10 ENCOUNTER — Encounter (HOSPITAL_COMMUNITY): Payer: Self-pay

## 2016-02-10 ENCOUNTER — Emergency Department (HOSPITAL_COMMUNITY)
Admission: EM | Admit: 2016-02-10 | Discharge: 2016-02-10 | Disposition: A | Discharge status: Home / Self Care | Payer: Medicaid Other | Attending: Emergency Medicine | Admitting: Emergency Medicine

## 2016-02-10 DIAGNOSIS — R05 Cough: Secondary | ICD-10-CM | POA: Diagnosis not present

## 2016-02-10 DIAGNOSIS — J45909 Unspecified asthma, uncomplicated: Secondary | ICD-10-CM | POA: Diagnosis not present

## 2016-02-10 DIAGNOSIS — Z87891 Personal history of nicotine dependence: Secondary | ICD-10-CM | POA: Diagnosis not present

## 2016-02-10 DIAGNOSIS — Z5321 Procedure and treatment not carried out due to patient leaving prior to being seen by health care provider: Secondary | ICD-10-CM | POA: Diagnosis not present

## 2016-02-10 DIAGNOSIS — R509 Fever, unspecified: Secondary | ICD-10-CM | POA: Diagnosis not present

## 2016-02-10 MED ORDER — ACETAMINOPHEN 325 MG PO TABS
ORAL_TABLET | ORAL | Status: AC
  Filled 2016-02-10: qty 2

## 2016-02-10 MED ORDER — ACETAMINOPHEN 325 MG PO TABS
650.0000 mg | ORAL_TABLET | Freq: Once | ORAL | Status: AC
  Administered 2016-02-10: 650 mg via ORAL

## 2016-02-10 NOTE — ED Notes (Signed)
  Pt came to RN stating she called EMS and should not have to wait in lobby.  This RN tried to explain wait times and delays.  Patient asked for IV to removed.  Stated she would speak to RN if she decided to leave.  Pt did not come back to speak to RN, but patient not in lobby when called for room.

## 2016-02-10 NOTE — ED Triage Notes (Addendum)
Flu like symptoms for 4 days.   N/v ,  Chills and fever.  Headache and generalized body aches.Pt. Also has a cough and her lips are dried and cracked.  Pt. Received 4 mg Iv zofran en route Pt. Is alert and oriented X4  Skin is warm and dry. Mask applied to pt. Warm blanket given . Pt. Is very tearful in triage.

## 2017-01-26 ENCOUNTER — Emergency Department (HOSPITAL_COMMUNITY): Payer: Medicaid Other

## 2017-01-26 ENCOUNTER — Emergency Department (HOSPITAL_COMMUNITY)
Admission: EM | Admit: 2017-01-26 | Discharge: 2017-01-27 | Disposition: A | Discharge status: Home / Self Care | Payer: Medicaid Other | Attending: Emergency Medicine | Admitting: Emergency Medicine

## 2017-01-26 ENCOUNTER — Encounter (HOSPITAL_COMMUNITY): Payer: Self-pay | Admitting: Obstetrics and Gynecology

## 2017-01-26 ENCOUNTER — Other Ambulatory Visit: Payer: Self-pay

## 2017-01-26 DIAGNOSIS — Z87891 Personal history of nicotine dependence: Secondary | ICD-10-CM | POA: Diagnosis not present

## 2017-01-26 DIAGNOSIS — R319 Hematuria, unspecified: Secondary | ICD-10-CM | POA: Diagnosis not present

## 2017-01-26 DIAGNOSIS — R1031 Right lower quadrant pain: Secondary | ICD-10-CM | POA: Diagnosis present

## 2017-01-26 DIAGNOSIS — E876 Hypokalemia: Secondary | ICD-10-CM | POA: Insufficient documentation

## 2017-01-26 DIAGNOSIS — N390 Urinary tract infection, site not specified: Secondary | ICD-10-CM | POA: Insufficient documentation

## 2017-01-26 DIAGNOSIS — Z79899 Other long term (current) drug therapy: Secondary | ICD-10-CM | POA: Diagnosis not present

## 2017-01-26 DIAGNOSIS — K529 Noninfective gastroenteritis and colitis, unspecified: Secondary | ICD-10-CM | POA: Diagnosis not present

## 2017-01-26 DIAGNOSIS — J45909 Unspecified asthma, uncomplicated: Secondary | ICD-10-CM | POA: Insufficient documentation

## 2017-01-26 DIAGNOSIS — R102 Pelvic and perineal pain: Secondary | ICD-10-CM | POA: Insufficient documentation

## 2017-01-26 LAB — URINALYSIS, ROUTINE W REFLEX MICROSCOPIC
BILIRUBIN URINE: NEGATIVE
GLUCOSE, UA: NEGATIVE mg/dL
KETONES UR: 5 mg/dL — AB
NITRITE: POSITIVE — AB
Protein, ur: 30 mg/dL — AB
Specific Gravity, Urine: 1.021 (ref 1.005–1.030)
pH: 5 (ref 5.0–8.0)

## 2017-01-26 LAB — RAPID HIV SCREEN (HIV 1/2 AB+AG)
HIV 1/2 Antibodies: NEGATIVE — AB
HIV-1 P24 ANTIGEN - HIV24: NEGATIVE — AB
Interpretation (HIV Ag Ab): NEGATIVE

## 2017-01-26 LAB — COMPREHENSIVE METABOLIC PANEL
ALK PHOS: 76 U/L (ref 38–126)
ALT: 17 U/L (ref 14–54)
ANION GAP: 8 (ref 5–15)
AST: 22 U/L (ref 15–41)
Albumin: 4.4 g/dL (ref 3.5–5.0)
BUN: 14 mg/dL (ref 6–20)
CALCIUM: 9.5 mg/dL (ref 8.9–10.3)
CHLORIDE: 107 mmol/L (ref 101–111)
CO2: 21 mmol/L — ABNORMAL LOW (ref 22–32)
Creatinine, Ser: 1.09 mg/dL — ABNORMAL HIGH (ref 0.44–1.00)
Glucose, Bld: 95 mg/dL (ref 65–99)
Potassium: 3 mmol/L — ABNORMAL LOW (ref 3.5–5.1)
SODIUM: 136 mmol/L (ref 135–145)
Total Bilirubin: 0.9 mg/dL (ref 0.3–1.2)
Total Protein: 8.3 g/dL — ABNORMAL HIGH (ref 6.5–8.1)

## 2017-01-26 LAB — CBC WITH DIFFERENTIAL/PLATELET
BASOS PCT: 0 %
Basophils Absolute: 0 10*3/uL (ref 0.0–0.1)
EOS ABS: 0 10*3/uL (ref 0.0–0.7)
Eosinophils Relative: 0 %
HEMATOCRIT: 37.1 % (ref 36.0–46.0)
HEMOGLOBIN: 12.3 g/dL (ref 12.0–15.0)
Lymphocytes Relative: 5 %
Lymphs Abs: 0.7 10*3/uL (ref 0.7–4.0)
MCH: 26.3 pg (ref 26.0–34.0)
MCHC: 33.2 g/dL (ref 30.0–36.0)
MCV: 79.4 fL (ref 78.0–100.0)
Monocytes Absolute: 0.3 10*3/uL (ref 0.1–1.0)
Monocytes Relative: 2 %
NEUTROS ABS: 11.3 10*3/uL — AB (ref 1.7–7.7)
NEUTROS PCT: 93 %
Platelets: 234 10*3/uL (ref 150–400)
RBC: 4.67 MIL/uL (ref 3.87–5.11)
RDW: 15.3 % (ref 11.5–15.5)
WBC: 12.2 10*3/uL — AB (ref 4.0–10.5)

## 2017-01-26 LAB — I-STAT CG4 LACTIC ACID, ED
LACTIC ACID, VENOUS: 1.23 mmol/L (ref 0.5–1.9)
Lactic Acid, Venous: 1.25 mmol/L (ref 0.5–1.9)

## 2017-01-26 LAB — WET PREP, GENITAL
Clue Cells Wet Prep HPF POC: POSITIVE — AB
SPERM: NONE SEEN
Trich, Wet Prep: NONE SEEN
Yeast Wet Prep HPF POC: NONE SEEN

## 2017-01-26 LAB — I-STAT BETA HCG BLOOD, ED (MC, WL, AP ONLY)

## 2017-01-26 MED ORDER — IOPAMIDOL (ISOVUE-300) INJECTION 61%
INTRAVENOUS | Status: AC
  Filled 2017-01-26: qty 30

## 2017-01-26 MED ORDER — SODIUM CHLORIDE 0.9 % IV BOLUS (SEPSIS)
1000.0000 mL | Freq: Once | INTRAVENOUS | Status: AC
  Administered 2017-01-26: 1000 mL via INTRAVENOUS

## 2017-01-26 MED ORDER — MORPHINE SULFATE (PF) 4 MG/ML IV SOLN
4.0000 mg | Freq: Once | INTRAVENOUS | Status: AC
Start: 2017-01-26 — End: 2017-01-26
  Administered 2017-01-26: 4 mg via INTRAVENOUS
  Filled 2017-01-26: qty 1

## 2017-01-26 MED ORDER — IOPAMIDOL (ISOVUE-300) INJECTION 61%
100.0000 mL | Freq: Once | INTRAVENOUS | Status: AC | PRN
  Administered 2017-01-27: 100 mL via INTRAVENOUS

## 2017-01-26 MED ORDER — ONDANSETRON HCL 4 MG/2ML IJ SOLN
4.0000 mg | Freq: Once | INTRAMUSCULAR | Status: AC
  Administered 2017-01-26: 4 mg via INTRAVENOUS
  Filled 2017-01-26: qty 2

## 2017-01-26 MED ORDER — IOPAMIDOL (ISOVUE-300) INJECTION 61%
INTRAVENOUS | Status: AC
  Filled 2017-01-26: qty 100

## 2017-01-26 NOTE — ED Notes (Signed)
Informed Eyvonne MechanicJeffrey Hedges that rapid HIV is negative.

## 2017-01-26 NOTE — ED Provider Notes (Signed)
[facility]-EMERGENCY DEPT Provider Note   CSN: [id number] Arrival date & time: 01/26/17  1837     History   Chief Complaint Chief Complaint  Patient presents with  . Abdominal Pain  . Emesis  . Nausea  . Diarrhea    HPI [name] is a 27 y.o. female.  HPI   27 year old female with history of asthma, seizure presenting complaining of abdominal pain. Pt brought here via EMS from home.  Patient developed gradual onset of right lower quadrant abdominal pain started early this morning has been persistent throughout the day. She described pain as a stabbing sensation, nonradiating, severe, with associate nausea and vomiting. She also report having some mild diarrhea this morning. Endorse chills. Denies having chest pain, trouble breathing, productive cough, dysuria. She denies being pregnant and states that she just finished her menstruation. Patient states she is not sexually active.  Past Medical History:  Diagnosis Date  . Asthma   . Seizures (HCC)    still has them, not on meds, last at age 2    Patient Active Problem List   Diagnosis Date Noted  . Vaginal delivery 03/04/2013  . Late prenatal care 10/25/2012  . 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 Anemia 10/25/2012  . ? 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 Seizure disorder (HCC) 10/25/2012  . Asthma, chronic 10/25/2012  . Trichomonas 10/25/2012    Past Surgical History:  Procedure Laterality Date  . NO PAST SURGERIES      OB History    Gravida Para Term Preterm AB Living   1 1 1     1    SAB TAB Ectopic Multiple Live Births           1       Home Medications    Prior to Admission medications   Medication Sig Start Date End Date Taking? Authorizing Provider  ferrous sulfate 325 (65 FE) MG tablet Take 1 tablet (325 mg total) by mouth 2 (two) times daily with a meal. 03/04/13   Nigel Bridgeman, CNM  HYDROmorphone (DILAUDID) 2 MG tablet Take 1 tablet (2 mg total)  by mouth every 4 (four) hours as needed for severe pain. 03/04/13   Nigel Bridgeman, CNM    Family History Family History  Problem Relation Age of Onset  . Diabetes Maternal Grandmother   . Kidney disease Maternal Grandmother        dialysis    Social History Social History   Tobacco Use  . Smoking status: Former Smoker    Packs/day: 1.00    Types: Cigarettes  . Smokeless tobacco: Never Used  . Tobacco comment: quit with positive preg  Substance Use Topics  . Alcohol use: No    Comment: rare  . Drug use: No     Allergies   Patient has no known allergies.   Review of Systems Review of Systems  All other systems reviewed and are negative.    Physical Exam Updated Vital Signs BP 118/70   Pulse (!) 108   Temp (!) 100.8 F (38.2 C) (Oral)   Resp 16   Wt 53.1 kg (117 lb)   LMP 01/25/2017 (Exact Date)   SpO2 100%   BMI 19.47 kg/m   Physical Exam  Constitutional: She appears well-developed and well-nourished. She appears distressed (Patient appears uncomfortable, laying in a fetal position.).  HENT:  Head: Atraumatic.  Eyes: Conjunctivae are normal.  Neck: Neck supple.  Cardiovascular:  Tachycardia without audible rales  Pulmonary/Chest: Effort normal and breath sounds normal.  Abdominal: Soft. Normal appearance. There is tenderness in the right lower quadrant. There is guarding and tenderness at McBurney's point. There is no rebound and negative Murphy's sign. Hernia confirmed negative in the right inguinal area and confirmed negative in the left inguinal area.  Genitourinary:  Genitourinary Comments: Chaperone present during exam.  No inguinal lymphadenopathy or inguinal hernia noted.  Normal external genitalia.  Mild discomfort with speculum insertion.  Moderate vaginal discharge noted. Closed cervical os free of lesion or rash.  On bimanual examination, mild adnexal tenderness without CMT.  Neurological: She is alert.  Skin: No rash noted.  Psychiatric: She  has a normal mood and affect.  Nursing note and vitals reviewed.    ED Treatments / Results  Labs (all labs ordered are listed, but only abnormal results are displayed) Labs Reviewed  WET PREP, GENITAL - Abnormal; Notable for the following components:      Result Value   Clue Cells Wet Prep HPF POC POSITIVE (*)    WBC, Wet Prep HPF POC MANY (*)    All other components within normal limits  COMPREHENSIVE METABOLIC PANEL - Abnormal; Notable for the following components:   Potassium 3.0 (*)    CO2 21 (*)    Creatinine, Ser 1.09 (*)    Total Protein 8.3 (*)    All other components within normal limits  CBC WITH DIFFERENTIAL/PLATELET - Abnormal; Notable for the following components:   WBC 12.2 (*)    Neutro Abs 11.3 (*)    All other components within normal limits  URINALYSIS, ROUTINE W REFLEX MICROSCOPIC  RAPID HIV SCREEN (HIV 1/2 AB+AG)  RPR  I-STAT CG4 LACTIC ACID, ED  I-STAT BETA HCG BLOOD, ED ([facility], [facility], [facility] ONLY)  I-STAT CG4 LACTIC ACID, ED  GC/CHLAMYDIA PROBE AMP ([place]) NOT AT [facility]    EKG  EKG Interpretation None       Radiology Dg Chest 2 View  Result Date: 01/26/2017 CLINICAL DATA:  BILATERAL lower quadrant pain since 0500 hours with diarrhea, asthma, former smoker, cough, vomiting EXAM: CHEST  2 VIEW COMPARISON:  02/10/2016 FINDINGS: Normal heart size, mediastinal contours, and pulmonary vascularity. Lungs clear. No pleural effusion or pneumothorax. No acute osseous findings. IMPRESSION: No acute abnormalities. Electronically Signed   By: Ulyses Southward M.D.   On: 01/26/2017 19:39    Procedures Procedures (including critical care time)  Medications Ordered in ED Medications  iopamidol (ISOVUE-300) 61 % injection (not administered)  iopamidol (ISOVUE-300) 61 % injection (not administered)  iopamidol (ISOVUE-300) 61 % injection 100 mL (not administered)  morphine 4 MG/ML injection 4 mg (4 mg Intravenous Given 01/26/17 2251)  ondansetron (ZOFRAN) injection 4 mg  (4 mg Intravenous Given 01/26/17 2251)  sodium chloride 0.9 % bolus 1,000 mL (1,000 mLs Intravenous New Bag/Given 01/26/17 2251)     Initial Impression / Assessment and Plan / ED Course  I have reviewed the triage vital signs and the nursing notes.  Pertinent labs & imaging results that were available during my care of the patient were reviewed by me and considered in my medical decision making (see chart for details).     BP 99/60 (BP Location: Left Arm)   Pulse 98   Temp 99.4 F (37.4 C) (Oral)   Resp 18   Wt 53.1 kg (117 lb)   LMP 01/25/2017 (Exact Date)   SpO2 100%   BMI 19.47 kg/m    Final Clinical Impressions(s) / ED Diagnoses  Final diagnoses:  None    ED Discharge Orders    None     10:09 PM Patient presents with right lower quadrant abdominal pain with fever as high as 100.8, nausea, vomiting, and having diarrhea. Location of pain is concerning for appendicitis. She is not sexually active and therefore plan to obtain abdominal pelvic CT scan to rule out acute abnormal pathology. If CT scan is negative, will consider doing a pelvic examination to check for potential STI, ovarian torsion, or TOA  10:48 PM Pt with mild discomfort on pelvic exam without evidence of PID.  Does have vaginal discharge but sts she is not sexually active.  STI screening performed.    11:03 PM Pt signed out to oncoming provider who will f/u on labs and CT scan.  If CT is negative pt will need pelvic US to r/o ovarian torsion or TOA.     Fayrene Helperran, Heriberto Stmartin, PA-C 01/27/17 1205    Rolland PorterJames, Mark, MD 01/31/17 [phone]

## 2017-01-26 NOTE — ED Triage Notes (Signed)
Per EMS: Pt is coming from home. Pt is having left and right lower quadrant pain. Pt has some tenderness and guarding on palpation. Around 5am this morning she had some diarrhea. Pt feels febrile per EMS but no temperature yet. Pt denies pregnancy. Pt reports her last period was approximately a week ago.   Last Vitals: 116/60 BP HR 94 RR 18 Pain 10/10

## 2017-01-26 NOTE — ED Provider Notes (Signed)
27 year old female signed out at shift change.  Please see previous providers note for full H&P.  In brief patient presents today with complaints of bilateral lower abdominal pain, diarrhea, nausea and vomiting.  Patient was evaluated, she had a pelvic exam that showed no signs of cervical motion tenderness, significant discharge, or adnexal masses.  CT scan was ordered which showed small bowel wall thickening most consistent with enteritis.  Patient's symptoms improved while here in the ED, tolerating p.o.  This is likely viral gastroenteritis.  Patient's workup also consistent with urinary tract infection given the nitrite positive, small leukocytes to many white blood cell count and many bacteria.  Patient without significant flank pain, no signs of pyelonephritis on CT, uncomplicated urinary tract infection.  Patient given a dose of ceftriaxone here, discharged on Bactrim.  She is also slightly hypokalemic, she was given a run of potassium encouraged to increase potassium rich foods.  Patient is given strict return precautions, she verbalized understanding and agreement to today's plan had no further questions or concerns at time of discharge.  Acute abdominal exam shows patient still slightly tender to the abdomen diffusely, nonfocal nonsurgical.  Patient's vital signs are reassuring with no signs of significant infection.  At the time of discharge patient reports return of severe abdominal pain.  Patient complaining of lower abdominal pain given the location of symptoms ultrasound ordered to rule out torsion.  Ultrasound showed no significant findings.  Patient is stable for discharge and outpatient follow-up.   Vitals:   01/27/17 0136 01/27/17 0501  BP: 105/64 111/70  Pulse: 89 90  Resp: 18 18  Temp: 98.2 F (36.8 C)   SpO2: 100% 98%         Eyvonne MechanicHedges, Ara Grandmaison, PA-C 01/27/17 [id number]    Charlynne PanderYao, David Hsienta, MD 01/27/17 [phone]

## 2017-01-27 ENCOUNTER — Emergency Department (HOSPITAL_COMMUNITY): Payer: Medicaid Other

## 2017-01-27 LAB — RPR: RPR Ser Ql: NONREACTIVE

## 2017-01-27 MED ORDER — DEXTROSE 5 % IV SOLN
1.0000 g | Freq: Once | INTRAVENOUS | Status: AC
  Administered 2017-01-27: 1 g via INTRAVENOUS
  Filled 2017-01-27: qty 10

## 2017-01-27 MED ORDER — MORPHINE SULFATE (PF) 4 MG/ML IV SOLN
4.0000 mg | Freq: Once | INTRAVENOUS | Status: AC
  Administered 2017-01-27: 4 mg via INTRAVENOUS
  Filled 2017-01-27: qty 1

## 2017-01-27 MED ORDER — ONDANSETRON HCL 4 MG PO TABS: 4.0000 mg | ORAL_TABLET | Freq: Four times a day (QID) | ORAL | 0 refills | Status: DC

## 2017-01-27 MED ORDER — SULFAMETHOXAZOLE-TRIMETHOPRIM 800-160 MG PO TABS: 1.0000 | ORAL_TABLET | Freq: Two times a day (BID) | ORAL | 0 refills | Status: AC

## 2017-01-27 MED ORDER — POTASSIUM CHLORIDE 10 MEQ/100ML IV SOLN
10.0000 meq | Freq: Once | INTRAVENOUS | Status: AC
  Administered 2017-01-27: 10 meq via INTRAVENOUS
  Filled 2017-01-27: qty 100

## 2017-01-27 MED ORDER — OXYCODONE-ACETAMINOPHEN 5-325 MG PO TABS
1.0000 | ORAL_TABLET | Freq: Once | ORAL | Status: DC
  Filled 2017-01-27: qty 1

## 2017-01-27 NOTE — Discharge Instructions (Signed)
Please read attached information. If you experience any new or worsening signs or symptoms please return to the emergency room for evaluation. Please follow-up with your primary care provider or specialist as discussed. Please use medication prescribed only as directed and discontinue taking if you have any concerning signs or symptoms.   °

## 2017-01-28 LAB — GC/CHLAMYDIA PROBE AMP (~~LOC~~) NOT AT ARMC
CHLAMYDIA, DNA PROBE: NEGATIVE
Neisseria Gonorrhea: POSITIVE — AB

## 2017-05-25 ENCOUNTER — Encounter (HOSPITAL_COMMUNITY): Payer: Self-pay | Admitting: *Deleted

## 2017-05-25 ENCOUNTER — Emergency Department (HOSPITAL_COMMUNITY)
Admission: EM | Admit: 2017-05-25 | Discharge: 2017-05-25 | Disposition: A | Discharge status: Home / Self Care | Payer: Medicaid Other | Attending: Emergency Medicine | Admitting: Emergency Medicine

## 2017-05-25 ENCOUNTER — Other Ambulatory Visit: Payer: Self-pay

## 2017-05-25 DIAGNOSIS — J45909 Unspecified asthma, uncomplicated: Secondary | ICD-10-CM | POA: Diagnosis not present

## 2017-05-25 DIAGNOSIS — L259 Unspecified contact dermatitis, unspecified cause: Secondary | ICD-10-CM | POA: Insufficient documentation

## 2017-05-25 DIAGNOSIS — R21 Rash and other nonspecific skin eruption: Secondary | ICD-10-CM | POA: Diagnosis present

## 2017-05-25 DIAGNOSIS — Z87891 Personal history of nicotine dependence: Secondary | ICD-10-CM | POA: Insufficient documentation

## 2017-05-25 DIAGNOSIS — L308 Other specified dermatitis: Secondary | ICD-10-CM

## 2017-05-25 DIAGNOSIS — L309 Dermatitis, unspecified: Secondary | ICD-10-CM

## 2017-05-25 MED ORDER — TRIAMCINOLONE ACETONIDE 0.1 % EX CREA: 1.0000 "application " | TOPICAL_CREAM | Freq: Two times a day (BID) | CUTANEOUS | 0 refills | Status: DC

## 2017-05-25 NOTE — ED Provider Notes (Signed)
[facility] EMERGENCY DEPARTMENT Provider Note   CSN: [id number] Arrival date & time: 05/25/17  2059     History   Chief Complaint Chief Complaint  Patient presents with  . Rash    HPI [name] is a 27 y.o. female.  Chief complaint is breast rash  HPI: Patient presents with an itching rash on the inferior aspect of her right breast.  Has had it for a month.  Started using hydrocortisone yesterday.  No other areas of rash.  Had eczema as a child.  Past Medical History:  Diagnosis Date  . Asthma   . Seizures (HCC)    still has them, not on meds, last at age 14    Patient Active Problem List   Diagnosis Date Noted  . Vaginal delivery 03/04/2013  . Late prenatal care 10/25/2012  . 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 Anemia 10/25/2012  . ? 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 Seizure disorder (HCC) 10/25/2012  . Asthma, chronic 10/25/2012  . Trichomonas 10/25/2012    Past Surgical History:  Procedure Laterality Date  . NO PAST SURGERIES       OB History    Gravida  1   Para  1   Term  1   Preterm      AB      Living  1     SAB      TAB      Ectopic      Multiple      Live Births  1            Home Medications    Prior to Admission medications   Medication Sig Start Date End Date Taking? Authorizing Provider  ferrous sulfate 325 (65 FE) MG tablet Take 1 tablet (325 mg total) by mouth 2 (two) times daily with a meal. Patient not taking: Reported on 01/26/2017 03/04/13   Nigel Bridgeman, CNM  triamcinolone cream (KENALOG) 0.1 % Apply 1 application topically 2 (two) times daily. 05/25/17   Rolland Porter, MD    Family History Family History  Problem Relation Age of Onset  . Diabetes Maternal Grandmother   . Kidney disease Maternal Grandmother        dialysis    Social History Social History   Tobacco Use  . Smoking status: Former Smoker    Packs/day: 1.00    Types: Cigarettes  . Smokeless  tobacco: Never Used  . Tobacco comment: quit with positive preg  Substance Use Topics  . Alcohol use: No    Comment: rare  . Drug use: No     Allergies   Patient has no known allergies.   Review of Systems Review of Systems  Constitutional: Negative for appetite change, chills, diaphoresis, fatigue and fever.  HENT: Negative for mouth sores, sore throat and trouble swallowing.   Eyes: Negative for visual disturbance.  Respiratory: Negative for cough, chest tightness, shortness of breath and wheezing.   Cardiovascular: Negative for chest pain.  Gastrointestinal: Negative for abdominal distention, abdominal pain, diarrhea, nausea and vomiting.  Endocrine: Negative for polydipsia, polyphagia and polyuria.  Genitourinary: Negative for dysuria, frequency and hematuria.  Musculoskeletal: Negative for gait problem.  Skin: Positive for rash. Negative for color change and pallor.  Neurological: Negative for dizziness, syncope, light-headedness and headaches.  Hematological: Does not bruise/bleed easily.  Psychiatric/Behavioral: Negative for behavioral problems and confusion.     Physical Exam Updated Vital Signs BP 124/73   Pulse (!) 105  Temp 98.8 F (37.1 C)   Resp 16   LMP 05/13/2017   SpO2 100%   Physical Exam  Constitutional: She is oriented to person, place, and time. She appears well-developed and well-nourished. No distress.  HENT:  Head: Normocephalic.  Eyes: Pupils are equal, round, and reactive to light. Conjunctivae are normal. No scleral icterus.  Neck: Normal range of motion. Neck supple. No thyromegaly present.  Cardiovascular: Normal rate and regular rhythm. Exam reveals no gallop and no friction rub.  No murmur heard. Pulmonary/Chest: Effort normal and breath sounds normal. No respiratory distress. She has no wheezes. She has no rales.  Eczematous rash inferior aspect right breast approximately 10 cm of surface area.  Not full-thickness with excoriation or  secondary infection.  Abdominal: Soft. Bowel sounds are normal. She exhibits no distension. There is no tenderness. There is no rebound.  Musculoskeletal: Normal range of motion.  Neurological: She is alert and oriented to person, place, and time.  Skin: Skin is warm and dry. No rash noted.  Psychiatric: She has a normal mood and affect. Her behavior is normal.     ED Treatments / Results  Labs (all labs ordered are listed, but only abnormal results are displayed) Labs Reviewed - No data to display  EKG None  Radiology No results found.  Procedures Procedures (including critical care time)  Medications Ordered in ED Medications - No data to display   Initial Impression / Assessment and Plan / ED Course  I have reviewed the triage vital signs and the nursing notes.  Pertinent labs & imaging results that were available during my care of the patient were reviewed by me and considered in my medical decision making (see chart for details).    Triamcinolone cream  Final Clinical Impressions(s) / ED Diagnoses   Final diagnoses:  Other eczema  Eczema, unspecified type    ED Discharge Orders        Ordered    triamcinolone cream (KENALOG) 0.1 %  2 times daily     05/25/17 2327       Rolland Porter, MD 05/25/17 2335

## 2017-05-25 NOTE — Discharge Instructions (Signed)
Apply cream to rash am and pm

## 2017-05-25 NOTE — ED Notes (Signed)
Pt discharged from ED; instructions provided and scripts given; Pt encouraged to return to ED if symptoms worsen and to f/u with PCP; Pt verbalized understanding of all instructions 

## 2017-05-25 NOTE — ED Triage Notes (Signed)
Pt c/o rash to R breast for the past two months unrelieved with hydrocortisone cream

## 2017-09-21 ENCOUNTER — Other Ambulatory Visit: Payer: Self-pay

## 2017-09-21 ENCOUNTER — Emergency Department (HOSPITAL_COMMUNITY)
Admission: EM | Admit: 2017-09-21 | Discharge: 2017-09-21 | Disposition: A | Discharge status: Home / Self Care | Payer: Medicaid Other | Attending: Emergency Medicine | Admitting: Emergency Medicine

## 2017-09-21 ENCOUNTER — Encounter (HOSPITAL_COMMUNITY): Payer: Self-pay | Admitting: Emergency Medicine

## 2017-09-21 DIAGNOSIS — R1030 Lower abdominal pain, unspecified: Secondary | ICD-10-CM | POA: Diagnosis not present

## 2017-09-21 DIAGNOSIS — J45909 Unspecified asthma, uncomplicated: Secondary | ICD-10-CM | POA: Diagnosis not present

## 2017-09-21 DIAGNOSIS — Z79899 Other long term (current) drug therapy: Secondary | ICD-10-CM | POA: Insufficient documentation

## 2017-09-21 DIAGNOSIS — R102 Pelvic and perineal pain: Secondary | ICD-10-CM | POA: Diagnosis not present

## 2017-09-21 DIAGNOSIS — N3000 Acute cystitis without hematuria: Secondary | ICD-10-CM | POA: Insufficient documentation

## 2017-09-21 DIAGNOSIS — Z87891 Personal history of nicotine dependence: Secondary | ICD-10-CM | POA: Diagnosis not present

## 2017-09-21 LAB — COMPREHENSIVE METABOLIC PANEL
ALBUMIN: 3.7 g/dL (ref 3.5–5.0)
ALT: 16 U/L (ref 0–44)
AST: 25 U/L (ref 15–41)
Alkaline Phosphatase: 71 U/L (ref 38–126)
Anion gap: 10 (ref 5–15)
BUN: 9 mg/dL (ref 6–20)
CHLORIDE: 104 mmol/L (ref 98–111)
CO2: 28 mmol/L (ref 22–32)
CREATININE: 1.08 mg/dL — AB (ref 0.44–1.00)
Calcium: 9.3 mg/dL (ref 8.9–10.3)
GFR calc Af Amer: >60 mL/min (ref >60)
GFR calc non Af Amer: >60 mL/min (ref >60)
GLUCOSE: 71 mg/dL (ref 70–99)
Potassium: 3 mmol/L — ABNORMAL LOW (ref 3.5–5.1)
Sodium: 142 mmol/L (ref 135–145)
Total Bilirubin: 0.6 mg/dL (ref 0.3–1.2)
Total Protein: 7.1 g/dL (ref 6.5–8.1)

## 2017-09-21 LAB — URINALYSIS, ROUTINE W REFLEX MICROSCOPIC
BILIRUBIN URINE: NEGATIVE
Glucose, UA: NEGATIVE mg/dL
KETONES UR: 5 mg/dL — AB
Nitrite: NEGATIVE
PROTEIN: 100 mg/dL — AB
Specific Gravity, Urine: 1.028 (ref 1.005–1.030)
pH: 5 (ref 5.0–8.0)

## 2017-09-21 LAB — CBC
HCT: 37.6 % (ref 36.0–46.0)
Hemoglobin: 11.7 g/dL — ABNORMAL LOW (ref 12.0–15.0)
MCH: 26.2 pg (ref 26.0–34.0)
MCHC: 31.1 g/dL (ref 30.0–36.0)
MCV: 84.3 fL (ref 78.0–100.0)
PLATELETS: 212 10*3/uL (ref 150–400)
RBC: 4.46 MIL/uL (ref 3.87–5.11)
RDW: 15.3 % (ref 11.5–15.5)
WBC: 4.9 10*3/uL (ref 4.0–10.5)

## 2017-09-21 LAB — I-STAT BETA HCG BLOOD, ED (MC, WL, AP ONLY)

## 2017-09-21 LAB — LIPASE, BLOOD: LIPASE: 39 U/L (ref 11–51)

## 2017-09-21 MED ORDER — IBUPROFEN 800 MG PO TABS
800.0000 mg | ORAL_TABLET | Freq: Once | ORAL | Status: AC
  Administered 2017-09-21: 800 mg via ORAL
  Filled 2017-09-21: qty 1

## 2017-09-21 MED ORDER — FOSFOMYCIN TROMETHAMINE 3 G PO PACK
3.0000 g | PACK | Freq: Once | ORAL | Status: AC
  Administered 2017-09-21: 3 g via ORAL
  Filled 2017-09-21: qty 3

## 2017-09-21 MED ORDER — NAPROXEN 375 MG PO TABS: 375.0000 mg | ORAL_TABLET | Freq: Two times a day (BID) | ORAL | 0 refills | Status: DC

## 2017-09-21 NOTE — ED Triage Notes (Signed)
Pt c/o lower abdominal pain x 1 day. Denies nausea/vomiting. Denies vaginal discharge and urinary symptoms.

## 2017-09-21 NOTE — Discharge Instructions (Addendum)
Contact a health care provider if: °You have back pain. °You have a fever. °You feel nauseous or vomit. °Your symptoms do not get better after 3 days. °Your symptoms go away and then return. °Get help right away if: °You have severe back pain or lower abdominal pain. °You are vomiting and cannot keep down any medicines or water. °

## 2017-09-21 NOTE — ED Provider Notes (Signed)
[facility] EMERGENCY DEPARTMENT Provider Note   CSN: [id number] Arrival date & time: 09/21/17  1721     History   Chief Complaint Chief Complaint  Patient presents with  . Abdominal Pain    HPI [name] is a 27 y.o. female who presents the emergency department with chief complaint of pelvic pain and burning with urination.  Symptoms began yesterday.  Patient states that she has stinging when she pees but denies any vaginal symptoms, discharge, back pain, fever, hematuria, nausea, vomiting, constipation.  Patient has been waiting for a significantly long amount of time and I offered pelvic examination which she declined.  HPI  Past Medical History:  Diagnosis Date  . Asthma   . Seizures (HCC)    still has them, not on meds, last at age 43    Patient Active Problem List   Diagnosis Date Noted  . Vaginal delivery 03/04/2013  . Late prenatal care 10/25/2012  . 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 Anemia 10/25/2012  . ? 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 Seizure disorder (HCC) 10/25/2012  . Asthma, chronic 10/25/2012  . Trichomonas 10/25/2012    Past Surgical History:  Procedure Laterality Date  . NO PAST SURGERIES       OB History    Gravida  1   Para  1   Term  1   Preterm      AB      Living  1     SAB      TAB      Ectopic      Multiple      Live Births  1            Home Medications    Prior to Admission medications   Medication Sig Start Date End Date Taking? Authorizing Provider  ferrous sulfate 325 (65 FE) MG tablet Take 1 tablet (325 mg total) by mouth 2 (two) times daily with a meal. Patient not taking: Reported on 01/26/2017 03/04/13   Nigel Bridgeman, CNM  triamcinolone cream (KENALOG) 0.1 % Apply 1 application topically 2 (two) times daily. 05/25/17   Rolland Porter, MD    Family History Family History  Problem Relation Age of Onset  . Diabetes Maternal Grandmother   . Kidney  disease Maternal Grandmother        dialysis    Social History Social History   Tobacco Use  . Smoking status: Former Smoker    Packs/day: 1.00    Types: Cigarettes  . Smokeless tobacco: Never Used  . Tobacco comment: quit with positive preg  Substance Use Topics  . Alcohol use: No    Comment: rare  . Drug use: No     Allergies   Patient has no known allergies.   Review of Systems Review of Systems Ten systems reviewed and are negative for acute change, except as noted in the HPI.   Physical Exam Updated Vital Signs BP 119/64 (BP Location: Right Arm)   Pulse 77   Temp (!) 97 F (36.1 C) (Oral)   Resp 16   LMP 08/21/2017   SpO2 100%   Physical Exam  Constitutional: She is oriented to person, place, and time. She appears well-developed and well-nourished. No distress.  HENT:  Head: Normocephalic and atraumatic.  Eyes: Conjunctivae are normal. No scleral icterus.  Neck: Normal range of motion.  Cardiovascular: Normal rate, regular rhythm and normal heart sounds. Exam reveals no gallop and no friction rub.  No murmur heard. Pulmonary/Chest: Effort normal and breath sounds normal. No respiratory distress.  Abdominal: Soft. Bowel sounds are normal. She exhibits no distension and no mass. There is tenderness in the suprapubic area. There is no guarding and no CVA tenderness.  Neurological: She is alert and oriented to person, place, and time.  Skin: Skin is warm and dry. She is not diaphoretic.  Psychiatric: Her behavior is normal.  Nursing note and vitals reviewed.    ED Treatments / Results  Labs (all labs ordered are listed, but only abnormal results are displayed) Labs Reviewed  COMPREHENSIVE METABOLIC PANEL - Abnormal; Notable for the following components:      Result Value   Potassium 3.0 (*)    Creatinine, Ser 1.08 (*)    All other components within normal limits  CBC - Abnormal; Notable for the following components:   Hemoglobin 11.7 (*)    All other  components within normal limits  URINALYSIS, ROUTINE W REFLEX MICROSCOPIC - Abnormal; Notable for the following components:   APPearance HAZY (*)    Hgb urine dipstick MODERATE (*)    Ketones, ur 5 (*)    Protein, ur 100 (*)    Leukocytes, UA SMALL (*)    Bacteria, UA RARE (*)    All other components within normal limits  LIPASE, BLOOD  I-STAT BETA HCG BLOOD, ED ([facility], [facility], [facility] ONLY)    EKG None  Radiology No results found.  Procedures Procedures (including critical care time)  Medications Ordered in ED Medications  fosfomycin (MONUROL) packet 3 g (has no administration in time range)     Initial Impression / Assessment and Plan / ED Course  I have reviewed the triage vital signs and the nursing notes.  Pertinent labs & imaging results that were available during my care of the patient were reviewed by me and considered in my medical decision making (see chart for details).     Patient with apparent urinary tract infection.  Patient declined pelvic examination.  Discussed risks of not receiving monitor and patient understands.  She would like treatment for urinary tract infection was given p.o. fosfomycin here.  Anti-inflammatories to go home with.  I discussed return precautions with the patient has normal vital signs and appears appropriate for discharge at this time  Final Clinical Impressions(s) / ED Diagnoses   Final diagnoses:  Acute cystitis without hematuria    ED Discharge Orders    None       Arthor CaptainHarris, Bette Brienza, PA-C 09/21/17 2220    Terrilee FilesButler, Michael C, MD 09/22/17 1011

## 2017-09-21 NOTE — ED Notes (Signed)
Pt alert and oriented in NAD. Pt verbalized understanding of discharge instructions. 

## 2018-01-25 ENCOUNTER — Other Ambulatory Visit: Payer: Self-pay

## 2018-01-25 ENCOUNTER — Encounter (HOSPITAL_COMMUNITY): Payer: Self-pay | Admitting: *Deleted

## 2018-01-25 DIAGNOSIS — R319 Hematuria, unspecified: Secondary | ICD-10-CM | POA: Diagnosis not present

## 2018-01-25 DIAGNOSIS — R1011 Right upper quadrant pain: Secondary | ICD-10-CM | POA: Diagnosis present

## 2018-01-25 DIAGNOSIS — Z79899 Other long term (current) drug therapy: Secondary | ICD-10-CM | POA: Diagnosis not present

## 2018-01-25 DIAGNOSIS — Z87891 Personal history of nicotine dependence: Secondary | ICD-10-CM | POA: Insufficient documentation

## 2018-01-25 DIAGNOSIS — J45909 Unspecified asthma, uncomplicated: Secondary | ICD-10-CM | POA: Insufficient documentation

## 2018-01-25 DIAGNOSIS — R509 Fever, unspecified: Secondary | ICD-10-CM | POA: Diagnosis not present

## 2018-01-25 DIAGNOSIS — N3001 Acute cystitis with hematuria: Secondary | ICD-10-CM | POA: Diagnosis not present

## 2018-01-25 DIAGNOSIS — R112 Nausea with vomiting, unspecified: Secondary | ICD-10-CM | POA: Diagnosis not present

## 2018-01-25 LAB — URINALYSIS, ROUTINE W REFLEX MICROSCOPIC
Bilirubin Urine: NEGATIVE
Glucose, UA: NEGATIVE mg/dL
Ketones, ur: NEGATIVE mg/dL
Nitrite: POSITIVE — AB
Protein, ur: 100 mg/dL — AB
Specific Gravity, Urine: 1.021 (ref 1.005–1.030)
pH: 6 (ref 5.0–8.0)

## 2018-01-25 LAB — COMPREHENSIVE METABOLIC PANEL
ALK PHOS: 72 U/L (ref 38–126)
ALT: 13 U/L (ref 0–44)
AST: 23 U/L (ref 15–41)
Albumin: 5 g/dL (ref 3.5–5.0)
Anion gap: 12 (ref 5–15)
BILIRUBIN TOTAL: 0.6 mg/dL (ref 0.3–1.2)
BUN: 17 mg/dL (ref 6–20)
CO2: 21 mmol/L — ABNORMAL LOW (ref 22–32)
Calcium: 9.7 mg/dL (ref 8.9–10.3)
Chloride: 105 mmol/L (ref 98–111)
Creatinine, Ser: 1.08 mg/dL — ABNORMAL HIGH (ref 0.44–1.00)
GFR calc Af Amer: >60 mL/min (ref >60)
GFR calc non Af Amer: >60 mL/min (ref >60)
Glucose, Bld: 83 mg/dL (ref 70–99)
Potassium: 3.3 mmol/L — ABNORMAL LOW (ref 3.5–5.1)
Sodium: 138 mmol/L (ref 135–145)
Total Protein: 9.2 g/dL — ABNORMAL HIGH (ref 6.5–8.1)

## 2018-01-25 LAB — CBC
HEMATOCRIT: 42.8 % (ref 36.0–46.0)
Hemoglobin: 13.5 g/dL (ref 12.0–15.0)
MCH: 26.3 pg (ref 26.0–34.0)
MCHC: 31.5 g/dL (ref 30.0–36.0)
MCV: 83.4 fL (ref 80.0–100.0)
NRBC: 0 % (ref 0.0–0.2)
Platelets: 259 10*3/uL (ref 150–400)
RBC: 5.13 MIL/uL — AB (ref 3.87–5.11)
RDW: 14.6 % (ref 11.5–15.5)
WBC: 10.3 10*3/uL (ref 4.0–10.5)

## 2018-01-25 LAB — I-STAT BETA HCG BLOOD, ED (MC, WL, AP ONLY): I-stat hCG, quantitative: <5 m[IU]/mL (ref <5)

## 2018-01-25 LAB — LIPASE, BLOOD: LIPASE: 48 U/L (ref 11–51)

## 2018-01-25 NOTE — ED Triage Notes (Signed)
Pt stated "I started having belly pain x 2 weeks ago.  I haven't vomited in the last several days."  Pt c/o generalized abd pain.

## 2018-01-26 ENCOUNTER — Emergency Department (HOSPITAL_COMMUNITY): Payer: Medicaid Other

## 2018-01-26 ENCOUNTER — Emergency Department (HOSPITAL_COMMUNITY)
Admission: EM | Admit: 2018-01-26 | Discharge: 2018-01-26 | Disposition: A | Discharge status: Home / Self Care | Payer: Medicaid Other | Attending: Emergency Medicine | Admitting: Emergency Medicine

## 2018-01-26 DIAGNOSIS — N3001 Acute cystitis with hematuria: Secondary | ICD-10-CM

## 2018-01-26 DIAGNOSIS — R319 Hematuria, unspecified: Secondary | ICD-10-CM | POA: Diagnosis not present

## 2018-01-26 MED ORDER — SODIUM CHLORIDE 0.9 % IV BOLUS
1000.0000 mL | Freq: Once | INTRAVENOUS | Status: AC
  Administered 2018-01-26: 1000 mL via INTRAVENOUS

## 2018-01-26 MED ORDER — MORPHINE SULFATE (PF) 4 MG/ML IV SOLN
4.0000 mg | Freq: Once | INTRAVENOUS | Status: AC
  Administered 2018-01-26: 4 mg via INTRAVENOUS
  Filled 2018-01-26: qty 1

## 2018-01-26 MED ORDER — SULFAMETHOXAZOLE-TRIMETHOPRIM 800-160 MG PO TABS: 1.0000 | ORAL_TABLET | Freq: Two times a day (BID) | ORAL | 0 refills | Status: AC

## 2018-01-26 MED ORDER — SODIUM CHLORIDE 0.9 % IV SOLN
1.0000 g | Freq: Once | INTRAVENOUS | Status: AC
  Administered 2018-01-26: 1 g via INTRAVENOUS
  Filled 2018-01-26: qty 10

## 2018-01-26 MED ORDER — ACETAMINOPHEN 325 MG PO TABS
650.0000 mg | ORAL_TABLET | Freq: Once | ORAL | Status: AC
  Administered 2018-01-26: 650 mg via ORAL
  Filled 2018-01-26: qty 2

## 2018-01-26 MED ORDER — ONDANSETRON 4 MG PO TBDP: 4.0000 mg | ORAL_TABLET | Freq: Three times a day (TID) | ORAL | 0 refills | Status: DC | PRN

## 2018-01-26 NOTE — Discharge Instructions (Addendum)
Thank you for allowing me to care for you today in the Emergency Department.   Your first dose of antibiotics was given tonight in the emergency department.  Starting tomorrow morning, take 1 tablet of Bactrim 2 times daily for the next 7 days.  You can let 1 tablet of Zofran dissolve in your tongue every 8 hours as needed for nausea and vomiting.  Take 600 mg of ibuprofen with food or 650 mg of Tylenol once every 6 hours for pain control.  You can also alternate between these 2 medications every 3 hours if your pain returns.  Follow-up with primary care for recheck of your symptoms in the next week.  If you do not have a primary care provider, the number on your discharge paperwork to get established with 1.  Return to the emergency department if you develop high fever despite taking Tylenol and ibuprofen, persistent vomiting despite taking Zofran, worsening urinary symptoms, particularly after you have been on antibiotics for 3 days, or other new, concerning symptoms.

## 2018-01-26 NOTE — ED Provider Notes (Signed)
[facility]-EMERGENCY DEPT Provider Note   CSN: [id number] Arrival date & time: 01/25/18  2135     History   Chief Complaint Chief Complaint  Patient presents with  . Abdominal Pain    HPI CHARLYN VIALPANDO is a 28 y.o. female with a history of seizure disorder, trichomonas, asthma, and anemia who presents to the emergency department with a chief complaint of abdominal pain.  The patient endorses intermittent, progressively worsening abdominal pain and right flank pain for the last 2 weeks.  No known aggravating or alleviating factors.  She states "it just hurts" and is unable to further characterize the pain.  Denies a history of similar pain.  She reports associated urinary frequency and hesitancy.  Reports she has had subjective fever, chills, nausea and intermittent episodes of nonbloody, nonbilious emesis over the last few days.  No vomiting today.  She denies diarrhea, constipation, vaginal pain, discharge, or itching, chest pain, shortness of breath, body aches, headache, or cough.  LMP was January 08, 2018.  She is sexually active with one female partner.  No vaginal bleeding or spotting.  No treatment prior to arrival.  The history is provided by the patient. No language interpreter was used.    Past Medical History:  Diagnosis Date  . Asthma   . Seizures (HCC)    still has them, not on meds, last at age 75    Patient Active Problem List   Diagnosis Date Noted  . Vaginal delivery 03/04/2013  . Late prenatal care 10/25/2012  . 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 Anemia 10/25/2012  . ? 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 Seizure disorder (HCC) 10/25/2012  . Asthma, chronic 10/25/2012  . Trichomonas 10/25/2012    Past Surgical History:  Procedure Laterality Date  . NO PAST SURGERIES       OB History    Gravida  1   Para  1   Term  1   Preterm      AB      Living  1     SAB      TAB      Ectopic      Multiple      Live Births  1            Home Medications    Prior to Admission medications   Medication Sig Start Date End Date Taking? Authorizing Provider  ondansetron (ZOFRAN ODT) 4 MG disintegrating tablet Take 1 tablet (4 mg total) by mouth every 8 (eight) hours as needed for nausea or vomiting. 01/26/18   Ione Sandusky A, PA-C  sulfamethoxazole-trimethoprim (BACTRIM DS,SEPTRA DS) 800-160 MG tablet Take 1 tablet by mouth 2 (two) times daily for 7 days. 01/26/18 02/02/18  Treyson Axel, Coral Else, PA-C    Family History Family History  Problem Relation Age of Onset  . Diabetes Maternal Grandmother   . Kidney disease Maternal Grandmother        dialysis    Social History Social History   Tobacco Use  . Smoking status: Former Smoker    Packs/day: 1.00    Types: Cigarettes  . Smokeless tobacco: Never Used  . Tobacco comment: quit with positive preg  Substance Use Topics  . Alcohol use: No    Comment: rare  . Drug use: No     Allergies   Patient has no known allergies.   Review of Systems Review of Systems  Constitutional: Positive for chills and fever. Negative for activity  change and fatigue.  Respiratory: Negative for shortness of breath.   Cardiovascular: Negative for chest pain.  Gastrointestinal: Positive for abdominal pain, nausea and vomiting. Negative for diarrhea.  Genitourinary: Positive for flank pain and urgency. Negative for dysuria, hematuria, menstrual problem, vaginal bleeding, vaginal discharge and vaginal pain.  Musculoskeletal: Negative for back pain, myalgias, neck pain and neck stiffness.  Skin: Negative for rash.  Allergic/Immunologic: Negative for immunocompromised state.  Neurological: Negative for headaches.  Psychiatric/Behavioral: Negative for confusion.     Physical Exam Updated Vital Signs BP (!) 122/55 (BP Location: Left Arm)   Pulse 79   Temp 98.4 F (36.9 C) (Oral)   Resp 18   Ht 5\' 9"  (1.753 m)   Wt 54.3 kg   LMP 01/08/2018  (Approximate)   SpO2 99%   BMI 17.68 kg/m   Physical Exam Vitals signs and nursing note reviewed.  Constitutional:      General: She is not in acute distress.    Comments: Tearful on exam.  HENT:     Head: Normocephalic.  Eyes:     Conjunctiva/sclera: Conjunctivae normal.     Comments: Bilateral eyes are injected.  Neck:     Musculoskeletal: Neck supple.  Cardiovascular:     Rate and Rhythm: Normal rate and regular rhythm.     Heart sounds: No murmur. No friction rub. No gallop.   Pulmonary:     Effort: Pulmonary effort is normal. No respiratory distress.     Breath sounds: No stridor. No wheezing, rhonchi or rales.  Abdominal:     General: Bowel sounds are normal. There is no distension.     Palpations: Abdomen is soft.     Tenderness: There is abdominal tenderness in the suprapubic area. There is right CVA tenderness and left CVA tenderness. There is no guarding or rebound. Negative signs include Murphy's sign, Rovsing's sign and McBurney's sign.     Hernia: No hernia is present.     Comments: Tender to palpation in the suprapubic and bilateral CVAs.  Minimally tender to palpation in the right lower quadrant.  No tenderness over McBurney's point.  Upper abdomen and LLQ are unremarkable.  Skin:    General: Skin is warm.     Findings: No rash.  Neurological:     Mental Status: She is alert.  Psychiatric:        Behavior: Behavior normal.      ED Treatments / Results  Labs (all labs ordered are listed, but only abnormal results are displayed) Labs Reviewed  COMPREHENSIVE METABOLIC PANEL - Abnormal; Notable for the following components:      Result Value   Potassium 3.3 (*)    CO2 21 (*)    Creatinine, Ser 1.08 (*)    Total Protein 9.2 (*)    All other components within normal limits  CBC - Abnormal; Notable for the following components:   RBC 5.13 (*)    All other components within normal limits  URINALYSIS, ROUTINE W REFLEX MICROSCOPIC - Abnormal; Notable for the  following components:   Hgb urine dipstick SMALL (*)    Protein, ur 100 (*)    Nitrite POSITIVE (*)    Leukocytes, UA TRACE (*)    Bacteria, UA MANY (*)    All other components within normal limits  URINE CULTURE  LIPASE, BLOOD  I-STAT BETA HCG BLOOD, ED ([facility], [facility], [facility] ONLY)    EKG None  Radiology Ct Renal Stone Study  Result Date: 01/26/2018 CLINICAL DATA:  Abdominal  pain. Hematuria. EXAM: CT ABDOMEN AND PELVIS WITHOUT CONTRAST TECHNIQUE: Multidetector CT imaging of the abdomen and pelvis was performed following the standard protocol without IV contrast. COMPARISON:  Contrast-enhanced CT 01/26/2017 FINDINGS: Lower chest: Ground-glass/tree in bud opacities in the right lower lobe. No confluent airspace disease. Hepatobiliary: No focal liver abnormality is seen. No gallstones, gallbladder wall thickening, or biliary dilatation. Pancreas: Pancreas is not well-defined in the absence of IV contrast and paucity of intra-abdominal fat. No evidence of pancreatic inflammation. Spleen: Normal in size without focal abnormality. Adrenals/Urinary Tract: No adrenal nodule. No hydronephrosis. Right renal pelvis prominence is unchanged from prior exam. Trace bilateral perinephric edema, right greater than left. No urolithiasis. Ureters are decompressed without stones along the course. Urinary bladder is physiologically distended, mild perivesicular edema. Stomach/Bowel: Bowel evaluation is limited in the absence of enteric contrast and paucity of intra-abdominal fat. Stomach is nondistended. No small bowel obstruction or evidence of inflammation. Appendix not visualized. No appendicitis. Small volume of stool throughout the colon without colonic wall thickening or inflammatory change. Vascular/Lymphatic: Unremarkable noncontrast appearance of abdominopelvic vasculature. No bulky adenopathy, limited assessment for enlarged lymph nodes given lack contrast. Reproductive: Uterus and adnexa are unremarkable. Small to  moderate free fluid in the pelvis appears similar to prior exam. Other: No free air. Musculoskeletal: There are no acute or suspicious osseous abnormalities. IMPRESSION: 1. Mild perinephric edema and perivesicular edema, suggesting urinary tract infection. No urolithiasis or obstructive uropathy. 2. Ground-glass/tree in bud opacities in the right lower lobe, likely infectious bronchiolitis. Electronically Signed   By: Narda RutherfordMelanie  Sanford M.D.   On: 01/26/2018 02:59    Procedures Procedures (including critical care time)  Medications Ordered in ED Medications  cefTRIAXone (ROCEPHIN) 1 g in sodium chloride 0.9 % 100 mL IVPB (0 g Intravenous Stopped 01/26/18 0157)  morphine 4 MG/ML injection 4 mg (4 mg Intravenous Given 01/26/18 0120)  sodium chloride 0.9 % bolus 1,000 mL (0 mLs Intravenous Stopped 01/26/18 0222)  acetaminophen (TYLENOL) tablet 650 mg (650 mg Oral Given 01/26/18 0117)     Initial Impression / Assessment and Plan / ED Course  I have reviewed the triage vital signs and the nursing notes.  Pertinent labs & imaging results that were available during my care of the patient were reviewed by me and considered in my medical decision making (see chart for details).     28 year old female with a history of seizure disorder, trichomonas, asthma, and anemia presenting with suprapubic abdominal pain and right flank pain that is been intermittent and progressively worsening over the last 2 weeks.  UA is consistent with infection.  There is also hemoglobinuria.  Will order CT stone study to ensure the patient does not have an obstructive stone.  Pregnancy test is negative.  Mild hypokalemia of 3.3.  Will give a dose of Rocephin for UTI, pain medication, Tylenol, IV fluids and reassess.   CT scan with mild perinephric edema and perivesicular edema, suggestive of urinary tract infection.  No urolithiasis or obstructive uropathy.  There is also groundglass opacities in the left lower lobe, suggestive of  infectious bronchiolitis.  On reexamination, the patient is uncomfortable.  States she is feeling much better.  Pain is significantly improved.  She is tolerating fluids without difficulty.  Will treat for urinary tract infection.  Urine culture sent.  Low suspicion for ectopic pregnancy, ovarian torsion, will discharge home with Bactrim since she was given IV Rocephin in the ED along with symptomatic treatment for nausea.  Strict return precautions  given.  She is hemodynamically stable and in no acute distress.  She is safe for discharge home with outpatient follow-up at this time.   Final Clinical Impressions(s) / ED Diagnoses   Final diagnoses:  Acute cystitis with hematuria    ED Discharge Orders         Ordered    sulfamethoxazole-trimethoprim (BACTRIM DS,SEPTRA DS) 800-160 MG tablet  2 times daily     01/26/18 0319    ondansetron (ZOFRAN ODT) 4 MG disintegrating tablet  Every 8 hours PRN     01/26/18 0319           Airyanna Dipalma A, PA-C 01/26/18 [id number]    Glynn Octaveancour, Stephen, MD 01/26/18 [phone]

## 2018-03-06 ENCOUNTER — Encounter (HOSPITAL_COMMUNITY): Payer: Self-pay

## 2018-03-06 ENCOUNTER — Emergency Department (HOSPITAL_COMMUNITY)
Admission: EM | Admit: 2018-03-06 | Discharge: 2018-03-06 | Disposition: A | Discharge status: Home / Self Care | Payer: Medicaid Other | Attending: Emergency Medicine | Admitting: Emergency Medicine

## 2018-03-06 ENCOUNTER — Other Ambulatory Visit: Payer: Self-pay

## 2018-03-06 DIAGNOSIS — Z87891 Personal history of nicotine dependence: Secondary | ICD-10-CM | POA: Insufficient documentation

## 2018-03-06 DIAGNOSIS — K0510 Chronic gingivitis, plaque induced: Secondary | ICD-10-CM | POA: Diagnosis not present

## 2018-03-06 DIAGNOSIS — K0500 Acute gingivitis, plaque induced: Secondary | ICD-10-CM | POA: Insufficient documentation

## 2018-03-06 DIAGNOSIS — J45909 Unspecified asthma, uncomplicated: Secondary | ICD-10-CM | POA: Diagnosis not present

## 2018-03-06 DIAGNOSIS — K029 Dental caries, unspecified: Secondary | ICD-10-CM

## 2018-03-06 DIAGNOSIS — K0889 Other specified disorders of teeth and supporting structures: Secondary | ICD-10-CM | POA: Diagnosis present

## 2018-03-06 DIAGNOSIS — K047 Periapical abscess without sinus: Secondary | ICD-10-CM | POA: Diagnosis not present

## 2018-03-06 MED ORDER — TRAMADOL HCL 50 MG PO TABS
50.0000 mg | ORAL_TABLET | Freq: Once | ORAL | Status: AC
  Administered 2018-03-06: 50 mg via ORAL
  Filled 2018-03-06: qty 1

## 2018-03-06 MED ORDER — AMOXICILLIN 500 MG PO CAPS
500.0000 mg | ORAL_CAPSULE | Freq: Once | ORAL | Status: AC
  Administered 2018-03-06: 500 mg via ORAL
  Filled 2018-03-06: qty 1

## 2018-03-06 MED ORDER — NAPROXEN 500 MG PO TABS: 500.0000 mg | ORAL_TABLET | Freq: Two times a day (BID) | ORAL | 0 refills | Status: DC

## 2018-03-06 MED ORDER — AMOXICILLIN 500 MG PO CAPS: 500.0000 mg | ORAL_CAPSULE | Freq: Three times a day (TID) | ORAL | 0 refills | Status: DC

## 2018-03-06 NOTE — ED Provider Notes (Signed)
[facility]   CSN: [id number] Arrival date & time: 03/06/18  1826     History   Chief Complaint Chief Complaint  Patient presents with  . Dental Pain    HPI [name] is a 28 y.o. female who presents to the ED with dental pain. Patient reports the pain is located in the left lower dental area. Patient has noted occasional bleeding from the gum and pain. She also c/o a chipped tooth. The pain started today.  HPI  Past Medical History:  Diagnosis Date  . Asthma   . Seizures (HCC)    still has them, not on meds, last at age 28    Patient Active Problem List   Diagnosis Date Noted  . Vaginal delivery 03/04/2013  . Late prenatal care 10/25/2012  . 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 Anemia 10/25/2012  . ? 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 Seizure disorder (HCC) 10/25/2012  . Asthma, chronic 10/25/2012  . Trichomonas 10/25/2012    Past Surgical History:  Procedure Laterality Date  . NO PAST SURGERIES       OB History    Gravida  1   Para  1   Term  1   Preterm      AB      Living  1     SAB      TAB      Ectopic      Multiple      Live Births  1            Home Medications    Prior to Admission medications   Medication Sig Start Date End Date Taking? Authorizing Provider  amoxicillin (AMOXIL) 500 MG capsule Take 1 capsule (500 mg total) by mouth 3 (three) times daily. 03/06/18   Janne NapoleonNeese, Djon Tith M, NP  naproxen (NAPROSYN) 500 MG tablet Take 1 tablet (500 mg total) by mouth 2 (two) times daily. 03/06/18   Janne NapoleonNeese, Sintia Mckissic M, NP  ondansetron (ZOFRAN ODT) 4 MG disintegrating tablet Take 1 tablet (4 mg total) by mouth every 8 (eight) hours as needed for nausea or vomiting. 01/26/18   McDonald, Mia A, PA-C    Family History Family History  Problem Relation Age of Onset  . Diabetes Maternal Grandmother   . Kidney disease Maternal Grandmother        dialysis    Social  History Social History   Tobacco Use  . Smoking status: Former Smoker    Packs/day: 1.00    Types: Cigarettes  . Smokeless tobacco: Never Used  . Tobacco comment: quit with positive preg  Substance Use Topics  . Alcohol use: No    Comment: rare  . Drug use: No     Allergies   Patient has no known allergies.   Review of Systems Review of Systems  HENT: Positive for dental problem.   All other systems reviewed and are negative.    Physical Exam Updated Vital Signs BP 100/78 (BP Location: Right Arm)   Pulse 87   Temp 98 F (36.7 C) (Oral)   Resp 15   Ht 5\' 9"  (1.753 m)   Wt 49 kg   SpO2 100%   BMI 15.95 kg/m   Physical Exam Vitals signs and nursing note reviewed.  Constitutional:      General: She is not in acute distress.    Appearance: She is well-developed.  HENT:     Right Ear: Tympanic membrane normal.  Left Ear: Tympanic membrane normal.     Nose: Nose normal.     Mouth/Throat:     Mouth: Mucous membranes are moist.     Dentition: Abnormal dentition. Dental tenderness and gingival swelling present.      Comments: Swelling and tenderness to the gum in the left lower dental area.  Eyes:     Extraocular Movements: Extraocular movements intact.     Conjunctiva/sclera: Conjunctivae normal.  Neck:     Musculoskeletal: Neck supple.  Cardiovascular:     Rate and Rhythm: Normal rate.  Pulmonary:     Effort: Pulmonary effort is normal.  Musculoskeletal: Normal range of motion.  Skin:    General: Skin is warm and dry.  Neurological:     Mental Status: She is alert and oriented to person, place, and time.      ED Treatments / Results  Labs (all labs ordered are listed, but only abnormal results are displayed) Labs Reviewed - No data to display Radiology No results found.  Procedures Procedures (including critical care time)  Medications Ordered in ED Medications  amoxicillin (AMOXIL) capsule 500 mg (500 mg Oral Given 03/06/18 2104)    traMADol (ULTRAM) tablet 50 mg (50 mg Oral Given 03/06/18 2104)     Initial Impression / Assessment and Plan / ED Course  I have reviewed the triage vital signs and the nursing notes. Patient with toothache and gingivitis.  No gross abscess.  Exam unconcerning for Ludwig's angina or spread of infection.  Will treat with penicillin and anti-inflammatories medicine.  Urged patient to follow-up with dentist.  Referral given.  Final Clinical Impressions(s) / ED Diagnoses   Final diagnoses:  Infected dental caries  Gingivitis    ED Discharge Orders         Ordered    amoxicillin (AMOXIL) 500 MG capsule  3 times daily     03/06/18 2055    naproxen (NAPROSYN) 500 MG tablet  2 times daily     03/06/18 2055           Kerrie Buffalo [place], [place] 03/06/18 2119    Arby Barrette, MD 03/07/18 [id number]

## 2018-03-06 NOTE — ED Triage Notes (Signed)
Pt states left lower side dental pain starting today. Pt states she noticed some bleeding and think she may have chipped a tooth.

## 2018-03-06 NOTE — Discharge Instructions (Signed)
Call the dentist to schedule an appointment. °

## 2018-04-20 ENCOUNTER — Encounter (HOSPITAL_COMMUNITY): Payer: Self-pay

## 2018-04-20 ENCOUNTER — Other Ambulatory Visit: Payer: Self-pay

## 2018-04-20 ENCOUNTER — Emergency Department (HOSPITAL_COMMUNITY)
Admission: EM | Admit: 2018-04-20 | Discharge: 2018-04-20 | Disposition: A | Discharge status: Home / Self Care | Payer: Medicaid Other | Attending: Emergency Medicine | Admitting: Emergency Medicine

## 2018-04-20 ENCOUNTER — Emergency Department (HOSPITAL_COMMUNITY): Payer: Medicaid Other

## 2018-04-20 DIAGNOSIS — J45909 Unspecified asthma, uncomplicated: Secondary | ICD-10-CM | POA: Insufficient documentation

## 2018-04-20 DIAGNOSIS — R252 Cramp and spasm: Secondary | ICD-10-CM

## 2018-04-20 DIAGNOSIS — Y999 Unspecified external cause status: Secondary | ICD-10-CM | POA: Diagnosis not present

## 2018-04-20 DIAGNOSIS — E860 Dehydration: Secondary | ICD-10-CM | POA: Insufficient documentation

## 2018-04-20 DIAGNOSIS — Z79899 Other long term (current) drug therapy: Secondary | ICD-10-CM | POA: Insufficient documentation

## 2018-04-20 DIAGNOSIS — Z87891 Personal history of nicotine dependence: Secondary | ICD-10-CM | POA: Diagnosis not present

## 2018-04-20 DIAGNOSIS — Y92039 Unspecified place in apartment as the place of occurrence of the external cause: Secondary | ICD-10-CM | POA: Diagnosis not present

## 2018-04-20 DIAGNOSIS — Y9389 Activity, other specified: Secondary | ICD-10-CM | POA: Diagnosis not present

## 2018-04-20 DIAGNOSIS — R188 Other ascites: Secondary | ICD-10-CM | POA: Diagnosis not present

## 2018-04-20 DIAGNOSIS — R51 Headache: Secondary | ICD-10-CM | POA: Insufficient documentation

## 2018-04-20 DIAGNOSIS — R109 Unspecified abdominal pain: Secondary | ICD-10-CM

## 2018-04-20 LAB — COMPREHENSIVE METABOLIC PANEL
ALT: 12 U/L (ref 0–44)
AST: 24 U/L (ref 15–41)
Albumin: 4.3 g/dL (ref 3.5–5.0)
Alkaline Phosphatase: 79 U/L (ref 38–126)
Anion gap: 12 (ref 5–15)
BUN: 10 mg/dL (ref 6–20)
CO2: 22 mmol/L (ref 22–32)
Calcium: 9.8 mg/dL (ref 8.9–10.3)
Chloride: 100 mmol/L (ref 98–111)
Creatinine, Ser: 1.43 mg/dL — ABNORMAL HIGH (ref 0.44–1.00)
GFR calc Af Amer: 58 mL/min — ABNORMAL LOW (ref >60)
GFR calc non Af Amer: 50 mL/min — ABNORMAL LOW (ref >60)
Glucose, Bld: 86 mg/dL (ref 70–99)
Potassium: 3.5 mmol/L (ref 3.5–5.1)
Sodium: 134 mmol/L — ABNORMAL LOW (ref 135–145)
Total Bilirubin: 0.6 mg/dL (ref 0.3–1.2)
Total Protein: 8.1 g/dL (ref 6.5–8.1)

## 2018-04-20 LAB — I-STAT BETA HCG BLOOD, ED (MC, WL, AP ONLY): I-stat hCG, quantitative: <5 m[IU]/mL (ref <5)

## 2018-04-20 LAB — CBC
HCT: 38.7 % (ref 36.0–46.0)
Hemoglobin: 12.1 g/dL (ref 12.0–15.0)
MCH: 25.2 pg — ABNORMAL LOW (ref 26.0–34.0)
MCHC: 31.3 g/dL (ref 30.0–36.0)
MCV: 80.6 fL (ref 80.0–100.0)
PLATELETS: 233 10*3/uL (ref 150–400)
RBC: 4.8 MIL/uL (ref 3.87–5.11)
RDW: 15 % (ref 11.5–15.5)
WBC: 6.4 10*3/uL (ref 4.0–10.5)
nRBC: 0 % (ref 0.0–0.2)

## 2018-04-20 LAB — MAGNESIUM: Magnesium: 1.9 mg/dL (ref 1.7–2.4)

## 2018-04-20 MED ORDER — METHOCARBAMOL 500 MG PO TABS: 500.0000 mg | ORAL_TABLET | Freq: Every evening | ORAL | 0 refills | Status: DC | PRN

## 2018-04-20 MED ORDER — MORPHINE SULFATE (PF) 2 MG/ML IV SOLN
2.0000 mg | Freq: Once | INTRAVENOUS | Status: AC
  Administered 2018-04-20: 2 mg via INTRAVENOUS
  Filled 2018-04-20: qty 1

## 2018-04-20 MED ORDER — NAPROXEN 500 MG PO TABS: 500.0000 mg | ORAL_TABLET | Freq: Two times a day (BID) | ORAL | 0 refills | Status: DC

## 2018-04-20 MED ORDER — IOHEXOL 300 MG/ML  SOLN
100.0000 mL | Freq: Once | INTRAMUSCULAR | Status: AC | PRN
  Administered 2018-04-20: 100 mL via INTRAVENOUS

## 2018-04-20 MED ORDER — SODIUM CHLORIDE 0.9 % IV SOLN
Freq: Once | INTRAVENOUS | Status: AC
  Administered 2018-04-20: 18:00:00 via INTRAVENOUS
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
## 2018-04-20 NOTE — Discharge Instructions (Addendum)
Take naproxen 2 times a day with meals.  Do not take other anti-inflammatories at the same time (Advil, Motrin, ibuprofen, Aleve). You may supplement with Tylenol if you need further pain control. Use robaxin as needed for muscle stiffness or soreness.  Have caution, this may make you tired or groggy.  Do not drive or operate heavy machinery while taking this medicine. Use ice packs or heating pads if this helps control your pain. You will likely have continued muscle stiffness and soreness over the next couple days.  Follow-up with primary care in 1 week if your symptoms are not improving. Make sure you are staying well-hydrated water.  Your urine should be clear to pale yellow. Return to the emergency room if you develop severe worsening abdominal pain, persistent vomiting, bruising of your abdomen, blood in your urine, or any new or worsening symptoms.

## 2018-11-02 ENCOUNTER — Other Ambulatory Visit: Payer: Self-pay

## 2018-11-02 ENCOUNTER — Emergency Department (HOSPITAL_COMMUNITY)
Admission: EM | Admit: 2018-11-02 | Discharge: 2018-11-02 | Disposition: A | Discharge status: Home / Self Care | Payer: Medicaid Other | Attending: Emergency Medicine | Admitting: Emergency Medicine

## 2018-11-02 DIAGNOSIS — Z87891 Personal history of nicotine dependence: Secondary | ICD-10-CM | POA: Diagnosis not present

## 2018-11-02 DIAGNOSIS — J45909 Unspecified asthma, uncomplicated: Secondary | ICD-10-CM | POA: Diagnosis not present

## 2018-11-02 DIAGNOSIS — Y998 Other external cause status: Secondary | ICD-10-CM | POA: Diagnosis not present

## 2018-11-02 DIAGNOSIS — Z79899 Other long term (current) drug therapy: Secondary | ICD-10-CM | POA: Diagnosis not present

## 2018-11-02 DIAGNOSIS — Y9389 Activity, other specified: Secondary | ICD-10-CM | POA: Insufficient documentation

## 2018-11-02 DIAGNOSIS — Y92018 Other place in single-family (private) house as the place of occurrence of the external cause: Secondary | ICD-10-CM | POA: Diagnosis not present

## 2018-11-02 DIAGNOSIS — S20212A Contusion of left front wall of thorax, initial encounter: Secondary | ICD-10-CM | POA: Insufficient documentation

## 2018-11-02 DIAGNOSIS — S299XXA Unspecified injury of thorax, initial encounter: Secondary | ICD-10-CM | POA: Diagnosis present

## 2018-11-02 MED ORDER — CYCLOBENZAPRINE HCL 10 MG PO TABS: 10.0000 mg | ORAL_TABLET | Freq: Two times a day (BID) | ORAL | 0 refills | Status: DC | PRN
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
## 2018-11-20 DIAGNOSIS — B9689 Other specified bacterial agents as the cause of diseases classified elsewhere: Secondary | ICD-10-CM | POA: Diagnosis not present

## 2018-11-20 DIAGNOSIS — N760 Acute vaginitis: Secondary | ICD-10-CM | POA: Diagnosis not present

## 2018-11-20 DIAGNOSIS — R0781 Pleurodynia: Secondary | ICD-10-CM | POA: Diagnosis not present

## 2018-11-20 DIAGNOSIS — S20212A Contusion of left front wall of thorax, initial encounter: Secondary | ICD-10-CM | POA: Diagnosis not present

## 2018-12-31 DIAGNOSIS — Z1159 Encounter for screening for other viral diseases: Secondary | ICD-10-CM | POA: Diagnosis not present

## 2018-12-31 DIAGNOSIS — Z20828 Contact with and (suspected) exposure to other viral communicable diseases: Secondary | ICD-10-CM | POA: Diagnosis not present

## 2018-12-31 DIAGNOSIS — Z03818 Encounter for observation for suspected exposure to other biological agents ruled out: Secondary | ICD-10-CM | POA: Diagnosis not present

## 2019-01-02 DIAGNOSIS — Z03818 Encounter for observation for suspected exposure to other biological agents ruled out: Secondary | ICD-10-CM | POA: Diagnosis not present

## 2019-01-02 DIAGNOSIS — Z20828 Contact with and (suspected) exposure to other viral communicable diseases: Secondary | ICD-10-CM | POA: Diagnosis not present

## 2019-02-21 IMAGING — CR DG CHEST 2V
2 series · 2 of 2 positions shown · non-contrast
Comparison: 02/10/2016

CLINICAL DATA: BILATERAL lower quadrant pain since 1411 hours with
diarrhea, asthma, former smoker, cough, vomiting

EXAM:
CHEST  2 VIEW

[w chest pa]
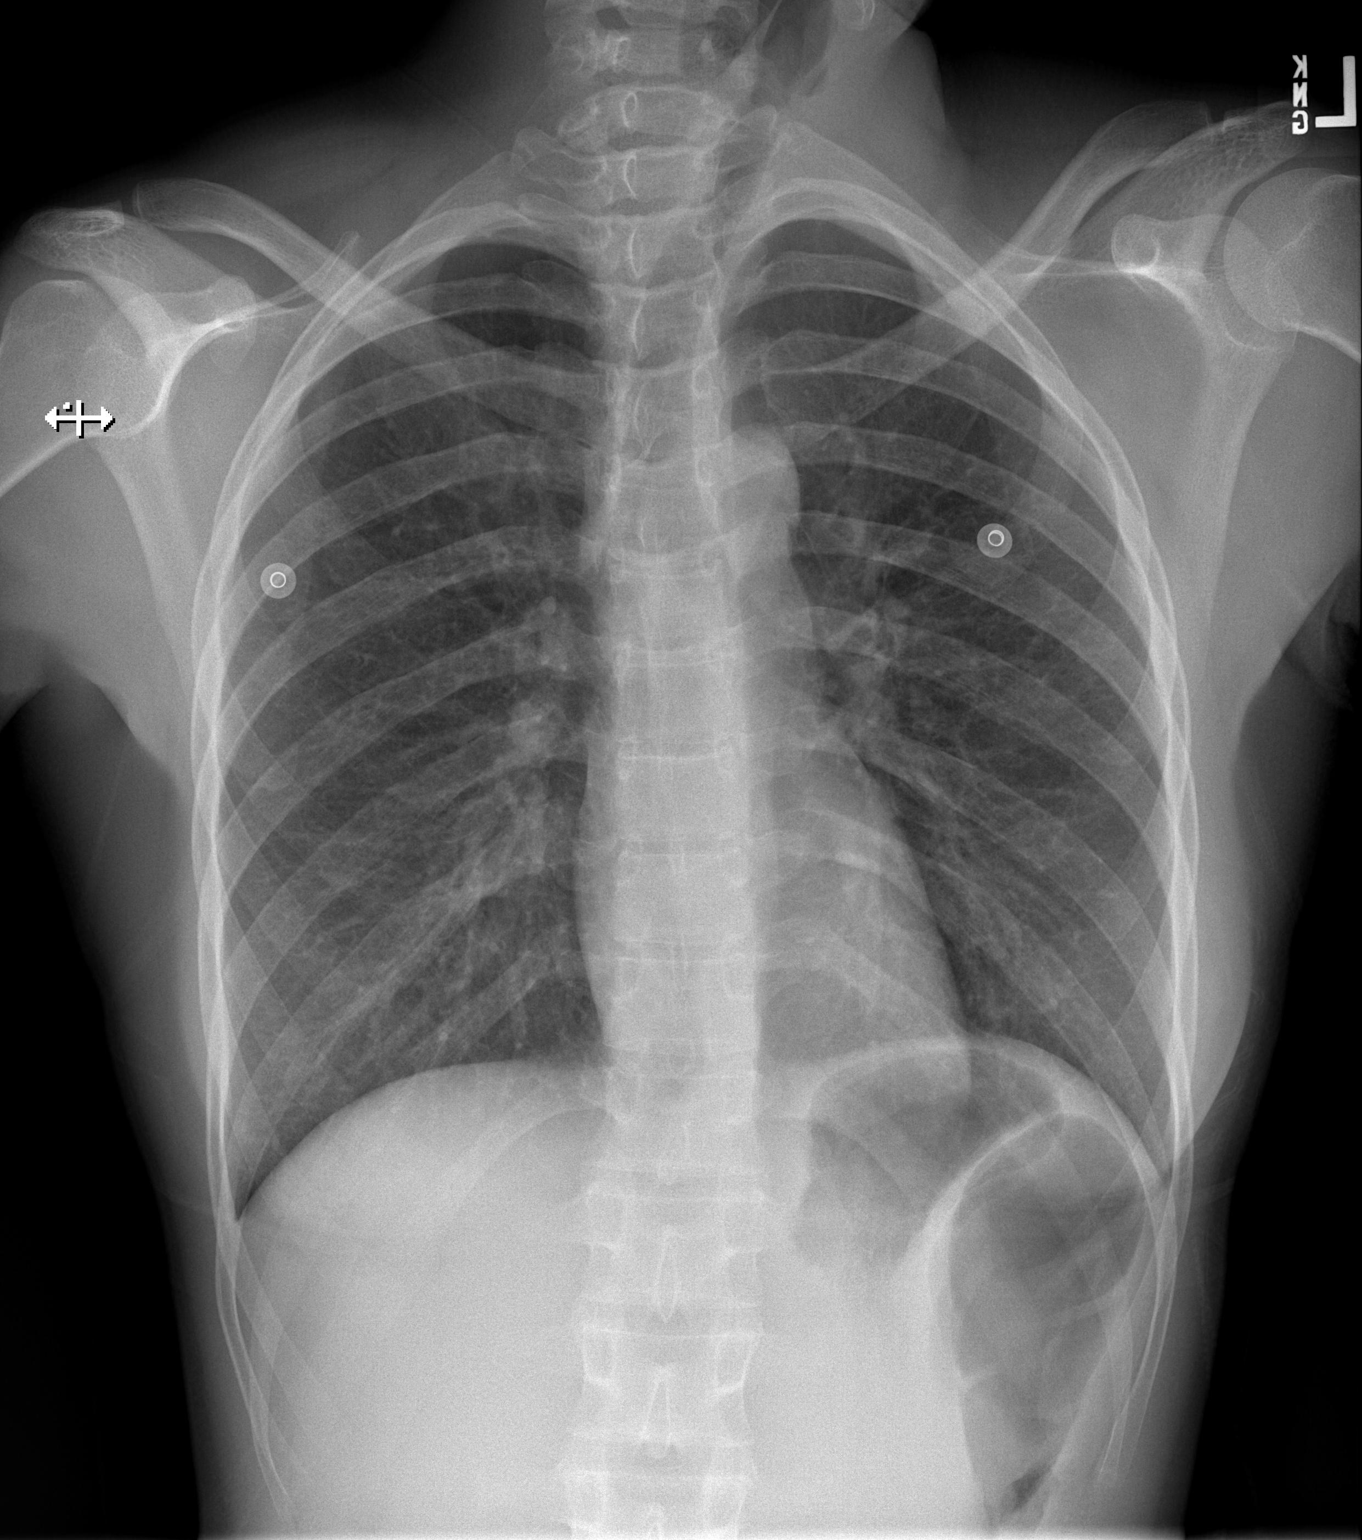

[w chest lat]
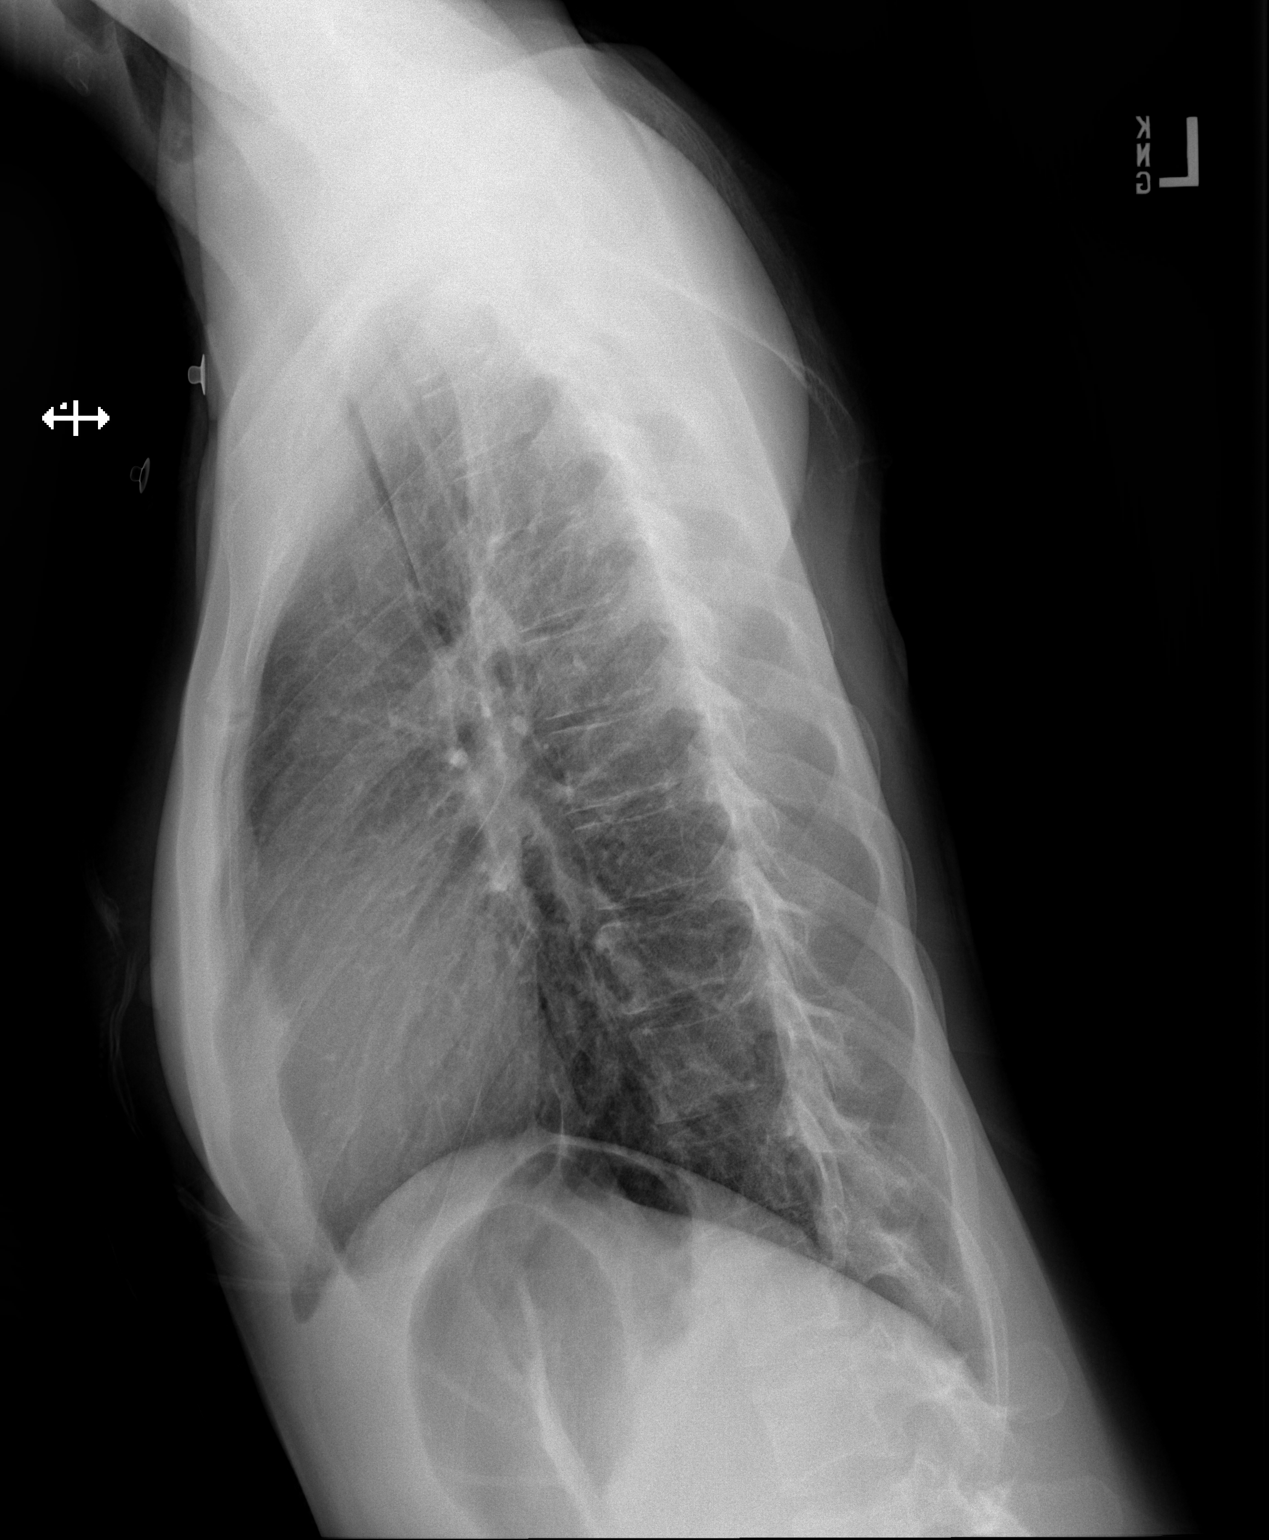

[2 of 2 positions shown; findings below may reference images not displayed]

FINDINGS: Normal heart size, mediastinal contours, and pulmonary vascularity.

Lungs clear.

No pleural effusion or pneumothorax.

No acute osseous findings.
IMPRESSION: No acute abnormalities.

## 2019-04-11 ENCOUNTER — Other Ambulatory Visit: Payer: Self-pay

## 2019-04-11 ENCOUNTER — Encounter (HOSPITAL_COMMUNITY): Payer: Self-pay | Admitting: Emergency Medicine

## 2019-04-11 ENCOUNTER — Emergency Department (HOSPITAL_COMMUNITY)
Admission: EM | Admit: 2019-04-11 | Discharge: 2019-04-11 | Disposition: A | Discharge status: Home / Self Care | Payer: Medicaid Other | Attending: Emergency Medicine | Admitting: Emergency Medicine

## 2019-04-11 DIAGNOSIS — J45909 Unspecified asthma, uncomplicated: Secondary | ICD-10-CM | POA: Diagnosis not present

## 2019-04-11 DIAGNOSIS — Z87891 Personal history of nicotine dependence: Secondary | ICD-10-CM | POA: Diagnosis not present

## 2019-04-11 DIAGNOSIS — Z3202 Encounter for pregnancy test, result negative: Secondary | ICD-10-CM | POA: Diagnosis not present

## 2019-04-11 DIAGNOSIS — Z3200 Encounter for pregnancy test, result unknown: Secondary | ICD-10-CM | POA: Diagnosis present

## 2019-04-11 DIAGNOSIS — Z3201 Encounter for pregnancy test, result positive: Secondary | ICD-10-CM | POA: Diagnosis not present

## 2019-04-11 DIAGNOSIS — Z79899 Other long term (current) drug therapy: Secondary | ICD-10-CM | POA: Diagnosis not present

## 2019-04-11 LAB — I-STAT BETA HCG BLOOD, ED (MC, WL, AP ONLY): I-stat hCG, quantitative: <5 m[IU]/mL (ref <5)

## 2019-04-11 NOTE — Discharge Instructions (Signed)
Your pregnancy test today was negative.  Please remember to use adequate contraception while you remain sexually active.  Please not hesitate to return to the emergency department with any new or worsening symptoms.

## 2019-04-11 NOTE — ED Triage Notes (Signed)
Patient requesting pregnancy test

## 2019-04-11 NOTE — ED Notes (Signed)
Patient verbalizes understanding of discharge instructions. Opportunity for questioning and answers were provided. Armband removed by staff, pt discharged from ED.  

## 2019-04-11 NOTE — ED Provider Notes (Signed)
[facility] EMERGENCY DEPARTMENT Provider Note   CSN: [id number] Arrival date & time: 04/11/19  2001     History Chief Complaint  Patient presents with  . Requesting Pregnancy Test    [name] is a 29 y.o. female.  HPI HPI Comments: [name] is a 29 y.o. female who presents to the Emergency Department requesting a pregnancy test.  She denies being sexually active.  Her last normal menstrual period was on March 4 and lasted 3 days.  She states this is normal for her.  She has no complaints at this time.  She denies fevers, chills, URI symptoms, shortness of breath, chest pain, urinary symptoms, vaginal discharge, vaginal bleeding, abdominal pain, nausea, vomiting, diarrhea, syncope.    Past Medical History:  Diagnosis Date  . Asthma   . Seizures (HCC)    still has them, not on meds, last at age 39    Patient Active Problem List   Diagnosis Date Noted  . Vaginal delivery 03/04/2013  . Late prenatal care 10/25/2012  . 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 Anemia 10/25/2012  . ? 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 Seizure disorder (HCC) 10/25/2012  . Asthma, chronic 10/25/2012  . Trichomonas 10/25/2012    Past Surgical History:  Procedure Laterality Date  . NO PAST SURGERIES       OB History    Gravida  1   Para  1   Term  1   Preterm      AB      Living  1     SAB      TAB      Ectopic      Multiple      Live Births  1           Family History  Problem Relation Age of Onset  . Diabetes Maternal Grandmother   . Kidney disease Maternal Grandmother        dialysis    Social History   Tobacco Use  . Smoking status: Former Smoker    Packs/day: 1.00    Types: Cigarettes  . Smokeless tobacco: Never Used  . Tobacco comment: quit with positive preg  Substance Use Topics  . Alcohol use: No    Comment: rare  . Drug use: No    Home Medications Prior to Admission medications     Medication Sig Start Date End Date Taking? Authorizing Provider  amoxicillin (AMOXIL) 500 MG capsule Take 1 capsule (500 mg total) by mouth 3 (three) times daily. 03/06/18   Janne Napoleon, NP  cyclobenzaprine (FLEXERIL) 10 MG tablet Take 1 tablet (10 mg total) by mouth 2 (two) times daily as needed for muscle spasms. 11/02/18   Gailen Shelter, PA  methocarbamol (ROBAXIN) 500 MG tablet Take 1 tablet (500 mg total) by mouth at bedtime as needed for muscle spasms. 04/20/18   Caccavale, Sophia, PA-C  naproxen (NAPROSYN) 500 MG tablet Take 1 tablet (500 mg total) by mouth 2 (two) times daily with a meal. 04/20/18   Caccavale, Sophia, PA-C  ondansetron (ZOFRAN ODT) 4 MG disintegrating tablet Take 1 tablet (4 mg total) by mouth every 8 (eight) hours as needed for nausea or vomiting. 01/26/18   McDonald, Mia A, PA-C    Allergies    Patient has no known allergies.  Review of Systems   Review of Systems  Constitutional: Negative for chills and fever.  HENT: Negative for congestion and rhinorrhea.  Respiratory: Negative for shortness of breath.   Cardiovascular: Negative for chest pain.  Gastrointestinal: Negative for abdominal pain, diarrhea, nausea and vomiting.  Genitourinary: Negative for difficulty urinating, dysuria, frequency and hematuria.  Neurological: Negative for syncope.    Physical Exam Updated Vital Signs BP 121/82 (BP Location: Left Arm)   Pulse 90   Temp 98.2 F (36.8 C) (Oral)   Resp 15   Ht 5\' 9"  (1.753 m)   Wt 50 kg   LMP 03/26/2019   SpO2 100%   BMI 16.28 kg/m   Physical Exam Vitals and nursing note reviewed.  Constitutional:      General: She is not in acute distress.    Appearance: Normal appearance. She is normal weight. She is not ill-appearing, toxic-appearing or diaphoretic.  HENT:     Head: Normocephalic and atraumatic.     Right Ear: External ear normal.     Left Ear: External ear normal.     Nose: Nose normal.  Eyes:     General: No scleral icterus.        Right eye: No discharge.        Left eye: No discharge.     Extraocular Movements: Extraocular movements intact.     Conjunctiva/sclera: Conjunctivae normal.     Pupils: Pupils are equal, round, and reactive to light.  Cardiovascular:     Rate and Rhythm: Normal rate and regular rhythm.     Pulses: Normal pulses.     Heart sounds: Normal heart sounds. No murmur. No friction rub. No gallop.   Pulmonary:     Effort: Pulmonary effort is normal. No respiratory distress.     Breath sounds: Normal breath sounds. No stridor. No wheezing, rhonchi or rales.  Abdominal:     General: Abdomen is flat.     Tenderness: There is no abdominal tenderness.  Musculoskeletal:        General: Normal range of motion.     Cervical back: Normal range of motion. No tenderness.     Right lower leg: No edema.     Left lower leg: No edema.  Skin:    General: Skin is warm and dry.  Neurological:     General: No focal deficit present.     Mental Status: She is alert and oriented to person, place, and time.  Psychiatric:        Mood and Affect: Mood normal.        Behavior: Behavior normal.     ED Results / Procedures / Treatments   Labs (all labs ordered are listed, but only abnormal results are displayed) Labs Reviewed  I-STAT BETA HCG BLOOD, ED ([facility], [facility], [facility] ONLY)    EKG None  Radiology No results found.  Procedures Procedures (including critical care time)  Medications Ordered in ED Medications - No data to display  ED Course  I have reviewed the triage vital signs and the nursing notes.  Pertinent labs & imaging results that were available during my care of the patient were reviewed by me and considered in my medical decision making (see chart for details).    MDM Rules/Calculators/A&P                      Patient is a 29 year old African-American female that presents today for pregnancy test.  Her pregnancy test was negative.  She has no complaints at this time.  Discussed  contraceptive use with patient.  She understands that she can return to the  emergency department with any new or worsening symptoms.  Her questions were answered.  She verbalized understanding.  She was amicable the time of discharge.  Vital signs stable.  Final Clinical Impression(s) / ED Diagnoses Final diagnoses:  Pregnancy test negative    Rx / DC Orders ED Discharge Orders    None       Rayna Sexton, PA-C 04/11/19 2128    Sherwood Gambler, MD 04/12/19 1525

## 2019-06-19 ENCOUNTER — Other Ambulatory Visit: Payer: Self-pay

## 2019-06-19 ENCOUNTER — Emergency Department (HOSPITAL_COMMUNITY)
Admission: EM | Admit: 2019-06-19 | Discharge: 2019-06-19 | Disposition: A | Discharge status: Home / Self Care | Payer: Medicaid Other | Attending: Emergency Medicine | Admitting: Emergency Medicine

## 2019-06-19 ENCOUNTER — Encounter (HOSPITAL_COMMUNITY): Payer: Self-pay

## 2019-06-19 DIAGNOSIS — Z20822 Contact with and (suspected) exposure to covid-19: Secondary | ICD-10-CM | POA: Diagnosis not present

## 2019-06-19 DIAGNOSIS — Z87891 Personal history of nicotine dependence: Secondary | ICD-10-CM | POA: Diagnosis not present

## 2019-06-19 DIAGNOSIS — Z8709 Personal history of other diseases of the respiratory system: Secondary | ICD-10-CM | POA: Insufficient documentation

## 2019-06-19 DIAGNOSIS — R52 Pain, unspecified: Secondary | ICD-10-CM | POA: Diagnosis not present

## 2019-06-19 DIAGNOSIS — R509 Fever, unspecified: Secondary | ICD-10-CM | POA: Insufficient documentation

## 2019-06-19 DIAGNOSIS — J029 Acute pharyngitis, unspecified: Secondary | ICD-10-CM

## 2019-06-19 DIAGNOSIS — G4489 Other headache syndrome: Secondary | ICD-10-CM | POA: Diagnosis not present

## 2019-06-19 DIAGNOSIS — R519 Headache, unspecified: Secondary | ICD-10-CM | POA: Diagnosis not present

## 2019-06-19 DIAGNOSIS — Z209 Contact with and (suspected) exposure to unspecified communicable disease: Secondary | ICD-10-CM | POA: Diagnosis not present

## 2019-06-19 DIAGNOSIS — R070 Pain in throat: Secondary | ICD-10-CM | POA: Diagnosis not present

## 2019-06-19 DIAGNOSIS — R05 Cough: Secondary | ICD-10-CM | POA: Diagnosis not present

## 2019-06-19 LAB — GROUP A STREP BY PCR: Group A Strep by PCR: NOT DETECTED

## 2019-06-19 LAB — POC SARS CORONAVIRUS 2 AG -  ED: SARS Coronavirus 2 Ag: NEGATIVE

## 2019-06-19 MED ORDER — ACETAMINOPHEN 325 MG PO TABS
650.0000 mg | ORAL_TABLET | Freq: Once | ORAL | Status: AC
  Administered 2019-06-19: 650 mg via ORAL
  Filled 2019-06-19: qty 2

## 2019-06-19 NOTE — ED Notes (Signed)
Called for room x2 no answer. 

## 2019-06-19 NOTE — ED Provider Notes (Signed)
[facility] EMERGENCY DEPARTMENT Provider Note   CSN: [id number] Arrival date & time: 06/19/19  2108     History Chief Complaint  Patient presents with  . Headache    [name] is a 29 y.o. female.  Pt complains of a headache and a sore throat.  Pt reports she has not taken anything for the headache   The history is provided by the patient. No language interpreter was used.  Headache Pain location:  Generalized Radiates to:  Does not radiate Onset quality:  Gradual Timing:  Constant Progression:  Worsening Chronicity:  New Relieved by:  Nothing Worsened by:  Nothing Associated symptoms: fever        Past Medical History:  Diagnosis Date  . Asthma   . Seizures (HCC)    still has them, not on meds, last at age 64    Patient Active Problem List   Diagnosis Date Noted  . Vaginal delivery 03/04/2013  . Late prenatal care 10/25/2012  . 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 Anemia 10/25/2012  . ? 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 Seizure disorder (HCC) 10/25/2012  . Asthma, chronic 10/25/2012  . Trichomonas 10/25/2012    Past Surgical History:  Procedure Laterality Date  . NO PAST SURGERIES       OB History    Gravida  1   Para  1   Term  1   Preterm      AB      Living  1     SAB      TAB      Ectopic      Multiple      Live Births  1           Family History  Problem Relation Age of Onset  . Diabetes Maternal Grandmother   . Kidney disease Maternal Grandmother        dialysis    Social History   Tobacco Use  . Smoking status: Former Smoker    Packs/day: 1.00    Types: Cigarettes  . Smokeless tobacco: Never Used  . Tobacco comment: quit with positive preg  Substance Use Topics  . Alcohol use: No    Comment: rare  . Drug use: No    Home Medications Prior to Admission medications   Medication Sig Start Date End Date Taking? Authorizing Provider  amoxicillin (AMOXIL)  500 MG capsule Take 1 capsule (500 mg total) by mouth 3 (three) times daily. 03/06/18   Janne Napoleon, NP  cyclobenzaprine (FLEXERIL) 10 MG tablet Take 1 tablet (10 mg total) by mouth 2 (two) times daily as needed for muscle spasms. 11/02/18   Gailen Shelter, PA  methocarbamol (ROBAXIN) 500 MG tablet Take 1 tablet (500 mg total) by mouth at bedtime as needed for muscle spasms. 04/20/18   Caccavale, Sophia, PA-C  naproxen (NAPROSYN) 500 MG tablet Take 1 tablet (500 mg total) by mouth 2 (two) times daily with a meal. 04/20/18   Caccavale, Sophia, PA-C  ondansetron (ZOFRAN ODT) 4 MG disintegrating tablet Take 1 tablet (4 mg total) by mouth every 8 (eight) hours as needed for nausea or vomiting. 01/26/18   McDonald, Mia A, PA-C    Allergies    Patient has no known allergies.  Review of Systems   Review of Systems  Constitutional: Positive for fever.  Neurological: Positive for headaches.  All other systems reviewed and are negative.   Physical Exam Updated Vital Signs BP  119/69 (BP Location: Right Arm)   Pulse (!) 105   Temp 99.5 F (37.5 C) (Oral)   Resp 20   Ht 5\' 4"  (1.626 m)   Wt 56.7 kg   SpO2 100%   BMI 21.46 kg/m   Physical Exam Vitals and nursing note reviewed.  Constitutional:      Appearance: She is well-developed.  HENT:     Head: Normocephalic.     Mouth/Throat:     Mouth: Mucous membranes are moist.  Eyes:     Extraocular Movements: Extraocular movements intact.  Cardiovascular:     Rate and Rhythm: Normal rate.  Pulmonary:     Effort: Pulmonary effort is normal.  Abdominal:     General: There is no distension.  Musculoskeletal:        General: Normal range of motion.     Cervical back: Normal range of motion.  Neurological:     Mental Status: She is alert and oriented to person, place, and time.  Psychiatric:        Mood and Affect: Mood normal.     ED Results / Procedures / Treatments   Labs (all labs ordered are listed, but only abnormal results are  displayed) Labs Reviewed  GROUP A STREP BY PCR  POC SARS CORONAVIRUS 2 AG -  ED    EKG None  Radiology No results found.  Procedures Procedures (including critical care time)  Medications Ordered in ED Medications  acetaminophen (TYLENOL) tablet 650 mg (has no administration in time range)    ED Course  I have reviewed the triage vital signs and the nursing notes.  Pertinent labs & imaging results that were available during my care of the patient were reviewed by me and considered in my medical decision making (see chart for details).    MDM Rules/Calculators/A&P                      MDM: strep and covid  Are negative.  Pt given tylenol.   Final Clinical Impression(s) / ED Diagnoses Final diagnoses:  Nonintractable headache, unspecified chronicity pattern, unspecified headache type    Rx / DC Orders ED Discharge Orders    None    An After Visit Summary was printed and given to the patient.    Fransico Meadow, Hershal Coria 06/19/19 2312    Maudie Flakes, MD 06/25/19 [phone]

## 2019-06-19 NOTE — Discharge Instructions (Signed)
Tylenol every 4 hours.  Drink plenty of fluids.  Return if any problems.  

## 2019-06-19 NOTE — ED Triage Notes (Signed)
Pt comes via [facility] for headache , fever, cough for a while, sore throat.

## 2019-06-20 NOTE — ED Notes (Signed)
Patient verbalizes understanding of discharge instructions. Opportunity for questioning and answers were provided. Armband removed by staff, pt discharged from ED via wheelchair to wait in lobby for friend.

## 2019-08-11 ENCOUNTER — Encounter (HOSPITAL_COMMUNITY): Payer: Self-pay

## 2019-08-11 ENCOUNTER — Ambulatory Visit (HOSPITAL_COMMUNITY)
Admission: EM | Admit: 2019-08-11 | Discharge: 2019-08-11 | Disposition: A | Discharge status: Home / Self Care | Payer: Medicaid Other | Attending: Family Medicine | Admitting: Family Medicine

## 2019-08-11 ENCOUNTER — Emergency Department (HOSPITAL_COMMUNITY)
Admission: EM | Admit: 2019-08-11 | Discharge: 2019-08-11 | Disposition: A | Discharge status: Home / Self Care | Payer: Medicaid Other | Attending: Emergency Medicine | Admitting: Emergency Medicine

## 2019-08-11 ENCOUNTER — Other Ambulatory Visit: Payer: Self-pay

## 2019-08-11 DIAGNOSIS — Z5321 Procedure and treatment not carried out due to patient leaving prior to being seen by health care provider: Secondary | ICD-10-CM | POA: Insufficient documentation

## 2019-08-11 DIAGNOSIS — M79604 Pain in right leg: Secondary | ICD-10-CM | POA: Insufficient documentation

## 2019-08-11 DIAGNOSIS — N3000 Acute cystitis without hematuria: Secondary | ICD-10-CM

## 2019-08-11 DIAGNOSIS — R1031 Right lower quadrant pain: Secondary | ICD-10-CM | POA: Diagnosis not present

## 2019-08-11 DIAGNOSIS — R05 Cough: Secondary | ICD-10-CM | POA: Diagnosis not present

## 2019-08-11 LAB — CBC
HCT: 43.4 % (ref 36.0–46.0)
Hemoglobin: 13.2 g/dL (ref 12.0–15.0)
MCH: 25.3 pg — ABNORMAL LOW (ref 26.0–34.0)
MCHC: 30.4 g/dL (ref 30.0–36.0)
MCV: 83.1 fL (ref 80.0–100.0)
Platelets: 266 10*3/uL (ref 150–400)
RBC: 5.22 MIL/uL — ABNORMAL HIGH (ref 3.87–5.11)
RDW: 15.9 % — ABNORMAL HIGH (ref 11.5–15.5)
WBC: 4.5 10*3/uL (ref 4.0–10.5)
nRBC: 0 % (ref 0.0–0.2)

## 2019-08-11 LAB — URINALYSIS, ROUTINE W REFLEX MICROSCOPIC
Bilirubin Urine: NEGATIVE
Glucose, UA: NEGATIVE mg/dL
Hgb urine dipstick: NEGATIVE
Ketones, ur: NEGATIVE mg/dL
Leukocytes,Ua: NEGATIVE
Nitrite: POSITIVE — AB
Protein, ur: 100 mg/dL — AB
Specific Gravity, Urine: 1.021 (ref 1.005–1.030)
pH: 6 (ref 5.0–8.0)

## 2019-08-11 LAB — COMPREHENSIVE METABOLIC PANEL
ALT: 17 U/L (ref 0–44)
AST: 27 U/L (ref 15–41)
Albumin: 4.2 g/dL (ref 3.5–5.0)
Alkaline Phosphatase: 70 U/L (ref 38–126)
Anion gap: 9 (ref 5–15)
BUN: 8 mg/dL (ref 6–20)
CO2: 24 mmol/L (ref 22–32)
Calcium: 9.5 mg/dL (ref 8.9–10.3)
Chloride: 106 mmol/L (ref 98–111)
Creatinine, Ser: 1.07 mg/dL — ABNORMAL HIGH (ref 0.44–1.00)
GFR calc Af Amer: >60 mL/min (ref >60)
GFR calc non Af Amer: >60 mL/min (ref >60)
Glucose, Bld: 86 mg/dL (ref 70–99)
Potassium: 4 mmol/L (ref 3.5–5.1)
Sodium: 139 mmol/L (ref 135–145)
Total Bilirubin: 0.2 mg/dL — ABNORMAL LOW (ref 0.3–1.2)
Total Protein: 7.9 g/dL (ref 6.5–8.1)

## 2019-08-11 LAB — I-STAT BETA HCG BLOOD, ED (MC, WL, AP ONLY): I-stat hCG, quantitative: <5 m[IU]/mL (ref <5)

## 2019-08-11 LAB — LIPASE, BLOOD: Lipase: 110 U/L — ABNORMAL HIGH (ref 11–51)

## 2019-08-11 MED ORDER — IBUPROFEN 600 MG PO TABS
600.0000 mg | ORAL_TABLET | Freq: Three times a day (TID) | ORAL | 0 refills | Status: DC | PRN
Start: 2019-08-11 — End: 2020-06-23

## 2019-08-11 MED ORDER — NITROFURANTOIN MONOHYD MACRO 100 MG PO CAPS
100.0000 mg | ORAL_CAPSULE | Freq: Two times a day (BID) | ORAL | 0 refills | Status: DC
Start: 2019-08-11 — End: 2020-06-23

## 2019-08-11 NOTE — ED Provider Notes (Signed)
[facility]    CSN: [id number] Arrival date & time: 08/11/19  1440      History   Chief Complaint Chief Complaint  Patient presents with   Abdominal Pain   Leg Pain    HPI [name] is a 29 y.o. female.   Pt is a 29 year old female with history of HSV, seizures, asthma, trichomonas. Presents with increased urinary frequency, chills. Denies dysuria, abdominal pain, low back pain, fevers. States she also fell yesterday at work and landed on right hip and lower back. Denies pain on right hip and lower back pain, but reports ongoing medial right thigh pain "for a while now." Reports right inner thigh pain with ambulating. No alleviating measures taken.  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 Authorizing Provider  ibuprofen (ADVIL) 600 MG tablet Take 1 tablet (600 mg total) by mouth every 8 (eight) hours as needed for moderate pain. 08/11/19   Dahlia Byes A, NP  methocarbamol (ROBAXIN) 500 MG tablet Take 1 tablet (500 mg total) by mouth at bedtime as needed for muscle spasms. 04/20/18   Caccavale, Sophia, PA-C  naproxen (NAPROSYN) 500 MG tablet Take 1 tablet (500 mg total) by mouth  2 (two) times daily with a meal. 04/20/18   Caccavale, Sophia, PA-C  nitrofurantoin, macrocrystal-monohydrate, (MACROBID) 100 MG capsule Take 1 capsule (100 mg total) by mouth 2 (two) times daily. 08/11/19   Janace Aris, NP    Family History Family History  Problem Relation Age of Onset   Diabetes Maternal Grandmother    Kidney disease Maternal Grandmother        dialysis    Social History Social History   Tobacco Use   Smoking status: Current Every Day Smoker    Packs/day: 1.00    Types: Cigarettes   Smokeless tobacco: Never Used   Tobacco comment: quit with positive preg  Substance Use Topics   Alcohol use: No    Comment: rare   Drug use: No     Allergies   Patient has no known allergies.   Review of Systems Review of Systems  Constitutional: Positive for chills. Negative for fatigue and fever.  HENT: Negative.   Eyes: Negative.   Respiratory: Negative.   Cardiovascular: Negative.   Gastrointestinal: Negative.   Genitourinary: Positive for frequency. Negative for dysuria.  Musculoskeletal:       Leg pain  Neurological: Negative.   Psychiatric/Behavioral: Negative.      Physical Exam Triage Vital Signs ED Triage Vitals  Enc Vitals Group     BP 08/11/19 1541 (!) 149/140     Pulse Rate 08/11/19 1541 72     Resp 08/11/19 1541 18     Temp 08/11/19 1541 98.2 F (36.8 C)     Temp Source 08/11/19 1541 Oral     SpO2 08/11/19 1541 100 %     Weight --      Height --      Head Circumference --      Peak Flow --      Pain Score 08/11/19 1542 0     Pain Loc --      Pain Edu? --      Excl. in GC? --    No data found.  Updated Vital Signs BP 112/73 (BP Location: Right Arm) Comment: re eval   Pulse 72    Temp 98.2 F (36.8 C) (Oral)    Resp 18    LMP 08/07/2019    SpO2 100%   Visual Acuity Right Eye Distance:   Left Eye Distance:   Bilateral Distance:    Right Eye Near:   Left Eye Near:    Bilateral Near:     Physical Exam Constitutional:        Appearance: She is well-developed and normal weight.  Pulmonary:     Effort: Pulmonary effort is normal.  Abdominal:     General: Bowel sounds are normal.     Palpations: Abdomen is soft.     Tenderness: There is no abdominal tenderness.  Skin:    General: Skin is warm and dry.     Capillary Refill: Capillary refill takes less than 2 seconds.  Neurological:     General: No focal deficit present.     Mental Status: She is alert.  Psychiatric:        Mood and Affect: Mood normal.      UC Treatments / Results  Labs (all labs ordered are listed, but only abnormal results are displayed) Labs Reviewed - No data to display  EKG   Radiology No results found.  Procedures Procedures (including critical care time)  Medications Ordered in UC Medications - No data to display  Initial Impression / Assessment and Plan / UC Course  I have reviewed the triage vital signs and the nursing notes.  Pertinent labs & imaging results that were available during my care of the patient were reviewed by me and considered in my medical decision making (see chart for details).     Acute cystitis without hematuria.  Patient with urinalysis in ER prior to coming here with positive nitrates.  She is having urinary symptoms.  We will go ahead and treat with Macrobid No concern for pyelonephritis at this time. She can do Advil for her pain as needed. Push fluids Follow up as needed for continued or worsening symptoms  Final Clinical Impressions(s) / UC Diagnoses   Final diagnoses:  Acute cystitis without hematuria     Discharge Instructions     Treating you for urinary tract infection Ibuprofen for pain as needed. Make sure you are drinking plenty of water and staying hydrated Follow up as needed for continued or worsening symptoms     ED Prescriptions    Medication Sig Dispense Auth. Provider   nitrofurantoin, macrocrystal-monohydrate, (MACROBID) 100 MG capsule Take 1  capsule  (100 mg total) by mouth 2 (two) times daily. 10 capsule Graciano Batson A, NP   ibuprofen (ADVIL) 600 MG tablet Take 1 tablet (600 mg total) by mouth every 8 (eight) hours as needed for moderate pain. 30 tablet Dahlia Byes A, NP     PDMP not reviewed this encounter.   Dahlia Byes A, NP 08/12/19 [phone]

## 2019-08-11 NOTE — ED Triage Notes (Signed)
Pt c/o abdominal pain to lower areas for approx 1 week, with chills. Also reports urinary frequency. Reports she slipped on wet surface yesterday and hurt her right upper leg and abdomen.  Denies n/v, flank/back pain, vaginal discharge. States LMP was 08/07/19 but only lasted two days.  Denies pain at present. Went to ER, had blood drawn but LWBS.

## 2019-08-11 NOTE — ED Notes (Signed)
Called for Pt to get vitals. No answer

## 2019-08-11 NOTE — Discharge Instructions (Addendum)
Treating you for urinary tract infection Ibuprofen for pain as needed. Make sure you are drinking plenty of water and staying hydrated Follow up as needed for continued or worsening symptoms

## 2019-08-11 NOTE — ED Triage Notes (Signed)
Pt reports RLQ pain that radiates to her right leg for the past few days, no n/v, some diarrhea. Pt also reports np cough for the past couple months. Resp e.u

## 2019-08-11 NOTE — ED Notes (Signed)
Pt named called again for updated vitals, no response. Pt not in bathroom or outside

## 2019-08-12 ENCOUNTER — Telehealth: Payer: Self-pay | Admitting: *Deleted

## 2019-08-12 NOTE — Telephone Encounter (Signed)
Pt originally to [facility] 08/11/19 at 1215 but left without being seen; pt then went to [facility] 08/11/19 at 1440; attempted to contact pt to complete transition of care assessment; left message on voicemail.  Burnard Bunting, RN, BSN, CCRN Patient Engagement Center [phone]

## 2019-08-14 ENCOUNTER — Telehealth: Payer: Self-pay | Admitting: *Deleted

## 2019-08-14 NOTE — Telephone Encounter (Signed)
2nd attempt to contact pt to complete transition of care assessment; left message on voicemail. Katrice Antasia Haider, RN, BSN, CCRN  Patient Engagement Center [phone]   

## 2019-12-10 ENCOUNTER — Other Ambulatory Visit: Payer: Self-pay

## 2019-12-10 ENCOUNTER — Ambulatory Visit (HOSPITAL_COMMUNITY)
Admission: EM | Admit: 2019-12-10 | Discharge: 2019-12-10 | Discharge status: Against Medical Advice | Payer: Medicaid Other

## 2020-04-06 ENCOUNTER — Encounter (HOSPITAL_COMMUNITY): Payer: Self-pay

## 2020-04-06 ENCOUNTER — Other Ambulatory Visit: Payer: Self-pay

## 2020-04-06 ENCOUNTER — Ambulatory Visit (HOSPITAL_COMMUNITY)
Admission: EM | Admit: 2020-04-06 | Discharge: 2020-04-06 | Disposition: A | Discharge status: Home / Self Care | Payer: Medicaid Other | Attending: Medical Oncology | Admitting: Medical Oncology

## 2020-04-06 DIAGNOSIS — K047 Periapical abscess without sinus: Secondary | ICD-10-CM | POA: Diagnosis not present

## 2020-04-06 MED ORDER — AMOXICILLIN-POT CLAVULANATE 875-125 MG PO TABS
1.0000 | ORAL_TABLET | Freq: Two times a day (BID) | ORAL | 0 refills | Status: DC
Start: 2020-04-06 — End: 2020-06-23

## 2020-04-06 NOTE — ED Provider Notes (Signed)
[facility]    CSN: [id number] Arrival date & time: 04/06/20  1611      History   Chief Complaint Chief Complaint  Patient presents with  . Dental Pain    HPI [name] is a 30 y.o. female.   HPI   Dental Pain: Patient states that for the past 2 weeks she has had dental pain.  The dental pain is located on her bottom left frontal teeth.  No known injury.  She states that she is having some pain with eating.  No fevers, trouble breathing or trouble swallowing.  She has not tried anything for pain.  She is not up-to-date on seeing a dentist.  Past Medical History:  Diagnosis Date  . Asthma   . Seizures (HCC)    still has them, not on meds, last at age 30    Patient Active Problem List   Diagnosis Date Noted  . Vaginal delivery 03/04/2013  . Late prenatal care 10/25/2012  . 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 Anemia 10/25/2012  . ? 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 Seizure disorder (HCC) 10/25/2012  . Asthma, chronic 10/25/2012  . Trichomonas 10/25/2012    Past Surgical History:  Procedure Laterality Date  . NO PAST SURGERIES      OB History    Gravida  1   Para  1   Term  1   Preterm      AB      Living  1     SAB      IAB      Ectopic      Multiple      Live Births  1            Home Medications    Prior to Admission medications   Medication Sig Start Date End Date Taking? Authorizing Provider  ibuprofen (ADVIL) 600 MG tablet Take 1 tablet (600 mg total) by mouth every 8 (eight) hours as needed for moderate pain. 08/11/19   Dahlia Byes A, NP  methocarbamol (ROBAXIN) 500 MG tablet Take 1 tablet (500 mg total) by mouth at bedtime as needed for muscle spasms. 04/20/18   Caccavale, Sophia, PA-C  naproxen (NAPROSYN) 500 MG tablet Take 1 tablet (500 mg total) by mouth 2 (two) times daily with a meal. 04/20/18   Caccavale, Sophia, PA-C  nitrofurantoin, macrocrystal-monohydrate, (MACROBID) 100 MG  capsule Take 1 capsule (100 mg total) by mouth 2 (two) times daily. 08/11/19   Janace Aris, NP    Family History Family History  Problem Relation Age of Onset  . Diabetes Maternal Grandmother   . Kidney disease Maternal Grandmother        dialysis    Social History Social History   Tobacco Use  . Smoking status: Current Every Day Smoker    Packs/day: 1.00    Types: Cigarettes  . Smokeless tobacco: Never Used  . Tobacco comment: quit with positive preg  Substance Use Topics  . Alcohol use: No    Comment: rare  . Drug use: No     Allergies   Patient has no known allergies.   Review of Systems Review of Systems  As stated above in HPI Physical Exam Triage Vital Signs ED Triage Vitals  Enc Vitals Group     BP 04/06/20 1804 121/83     Pulse Rate 04/06/20 1804 63     Resp 04/06/20 1804 17     Temp 04/06/20 1804 97.6 F (  36.4 C)     Temp Source 04/06/20 1804 Oral     SpO2 04/06/20 1804 100 %     Weight --      Height --      Head Circumference --      Peak Flow --      Pain Score 04/06/20 1803 10     Pain Loc --      Pain Edu? --      Excl. in GC? --    No data found.  Updated Vital Signs BP 121/83 (BP Location: Right Arm)   Pulse 63   Temp 97.6 F (36.4 C) (Oral)   Resp 17   SpO2 100%   Physical Exam Vitals and nursing note reviewed.  Constitutional:      General: She is not in acute distress.    Appearance: Normal appearance. She is not ill-appearing, toxic-appearing or diaphoretic.  HENT:     Nose: Nose normal.     Mouth/Throat:     Lips: Pink.     Mouth: Mucous membranes are moist. No angioedema.     Dentition: Abnormal dentition. Dental tenderness, gingival swelling, dental caries, dental abscesses and gum lesions present.     Tongue: No lesions. Tongue does not deviate from midline.     Palate: No mass and lesions.     Pharynx: Uvula midline. No pharyngeal swelling, oropharyngeal exudate, posterior oropharyngeal erythema or uvula swelling.       Comments: No edema, bogginess or tenderness of superior and inferior palate Cardiovascular:     Rate and Rhythm: Normal rate and regular rhythm.     Heart sounds: Normal heart sounds.  Pulmonary:     Effort: Pulmonary effort is normal.     Breath sounds: Normal breath sounds.  Neurological:     Mental Status: She is alert.      UC Treatments / Results  Labs (all labs ordered are listed, but only abnormal results are displayed) Labs Reviewed - No data to display  EKG   Radiology No results found.  Procedures Procedures (including critical care time)  Medications Ordered in UC Medications - No data to display  Initial Impression / Assessment and Plan / UC Course  I have reviewed the triage vital signs and the nursing notes.  Pertinent labs & imaging results that were available during my care of the patient were reviewed by me and considered in my medical decision making (see chart for details).     New.  Treating with Augmentin to prevent further infection and risk for Ludwick's which can be deadly.  Discussed how to use along with common potential side effects and precautions.  We did discuss red flag symptoms and that she will need to be seen by dentist in 7 to 14 days. Final Clinical Impressions(s) / UC Diagnoses   Final diagnoses:  None   Discharge Instructions   None    ED Prescriptions    None     PDMP not reviewed this encounter.   Rushie Chestnut, [place] 04/06/20 1818

## 2020-04-06 NOTE — ED Triage Notes (Signed)
Pt presents with dental pain x 2 weeks. Pt has not taken any OTC meds for pain.

## 2020-06-23 ENCOUNTER — Ambulatory Visit (HOSPITAL_COMMUNITY)
Admission: EM | Admit: 2020-06-23 | Discharge: 2020-06-23 | Disposition: A | Discharge status: Home / Self Care | Payer: Medicaid Other | Attending: Internal Medicine | Admitting: Internal Medicine

## 2020-06-23 ENCOUNTER — Encounter (HOSPITAL_COMMUNITY): Payer: Self-pay

## 2020-06-23 ENCOUNTER — Other Ambulatory Visit: Payer: Self-pay

## 2020-06-23 DIAGNOSIS — N921 Excessive and frequent menstruation with irregular cycle: Secondary | ICD-10-CM | POA: Insufficient documentation

## 2020-06-23 DIAGNOSIS — K0889 Other specified disorders of teeth and supporting structures: Secondary | ICD-10-CM | POA: Diagnosis not present

## 2020-06-23 LAB — TSH: TSH: 0.675 u[IU]/mL (ref 0.350–4.500)

## 2020-06-23 LAB — POC URINE PREG, ED: Preg Test, Ur: NEGATIVE

## 2020-06-23 MED ORDER — LIDOCAINE VISCOUS HCL 2 % MT SOLN
15.0000 mL | OROMUCOSAL | 0 refills | Status: DC | PRN
Start: 2020-06-23 — End: 2021-03-03

## 2020-06-23 MED ORDER — TRANEXAMIC ACID 650 MG PO TABS: 1300.0000 mg | ORAL_TABLET | Freq: Three times a day (TID) | ORAL | 0 refills | Status: AC

## 2020-06-23 MED ORDER — IBUPROFEN 600 MG PO TABS
600.0000 mg | ORAL_TABLET | Freq: Three times a day (TID) | ORAL | 0 refills | Status: DC | PRN
Start: 2020-06-23 — End: 2021-03-03

## 2020-06-23 MED ORDER — AMOXICILLIN-POT CLAVULANATE 875-125 MG PO TABS
1.0000 | ORAL_TABLET | Freq: Two times a day (BID) | ORAL | 0 refills | Status: DC
Start: 2020-06-23 — End: 2021-03-03

## 2020-06-23 NOTE — Discharge Instructions (Signed)
Please take medications as prescribed If symptoms worsen please return to urgent care to be reevaluated If you start feeling dizzy, lightheaded or near syncopal please return to urgent care Refer to the dental resources attached to this discharge paperwork to get some affordable dental care.

## 2020-06-23 NOTE — ED Triage Notes (Signed)
Pt reports dental pain x 2 weeks.   Pt reports she had the menstrual period lasted 2 days the past month period fro 2 days the past month and and started again yesterday 06/22/2020 and is "too heavy" flow. Pt change pad every 30 min. Pt reports abdominal cramps.

## 2020-06-24 NOTE — ED Provider Notes (Signed)
[facility]    CSN: [id number] Arrival date & time: 06/23/20  1442      History   Chief Complaint Chief Complaint  Patient presents with  . Dental Pain  . Vaginal Bleeding    HPI [name] is a 30 y.o. female comes to the urgent care with dental pain of 2 weeks duration.  Patient says symptoms started insidiously and has been persistent.  Pain is of moderate severity mainly affects the first left lower incisor.  No trauma to the teeth.  Patient has some dental caries and severe gum recession.  No fever or chills.  Patient also complains of irregular menstrual periods.  She has recently had menstrual cycles which last for a couple of days in preceding months and then following that she bleeds heavily in the next month.  Her current cycle started a couple of days and has been very heavy.  She is changing her pads about every 30 minutes she is experiencing abdominal cramps.  No dizziness, near syncope or syncopal episodes.  No abdominal bloating.  Patient is sexually active at this time.  No vaginal discharge.  No birth control use or discontinuation.  No weight gain.  She has not had GYN evaluation in a few years. HPI  Past Medical History:  Diagnosis Date  . Asthma   . Seizures (HCC)    still has them, not on meds, last at age 54    Patient Active Problem List   Diagnosis Date Noted  . Vaginal delivery 03/04/2013  . Late prenatal care 10/25/2012  . 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 Anemia 10/25/2012  . ? 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 Seizure disorder (HCC) 10/25/2012  . Asthma, chronic 10/25/2012  . Trichomonas 10/25/2012    Past Surgical History:  Procedure Laterality Date  . NO PAST SURGERIES      OB History    Gravida  1   Para  1   Term  1   Preterm      AB      Living  1     SAB      IAB      Ectopic      Multiple      Live Births  1            Home Medications    Prior to Admission  medications   Medication Sig Start Date End Date Taking? Authorizing Provider  lidocaine (XYLOCAINE) 2 % solution Use as directed 15 mLs in the mouth or throat as needed for mouth pain. 06/23/20  Yes Kharter Sestak, Britta Mccreedy, MD  tranexamic acid (LYSTEDA) 650 MG TABS tablet Take 2 tablets (1,300 mg total) by mouth 3 (three) times daily for 5 days. 06/23/20 06/28/20 Yes Cicily Bonano, Britta Mccreedy, MD  amoxicillin-clavulanate (AUGMENTIN) 875-125 MG tablet Take 1 tablet by mouth every 12 (twelve) hours. 06/23/20   Merrilee Jansky, MD  ibuprofen (ADVIL) 600 MG tablet Take 1 tablet (600 mg total) by mouth every 8 (eight) hours as needed for moderate pain. 06/23/20   LampteyBritta Mccreedy, MD    Family History Family History  Problem Relation Age of Onset  . Diabetes Maternal Grandmother   . Kidney disease Maternal Grandmother        dialysis    Social History Social History   Tobacco Use  . Smoking status: Current Every Day Smoker    Packs/day: 1.00    Types: Cigarettes  . Smokeless tobacco: Never  Used  . Tobacco comment: quit with positive preg  Substance Use Topics  . Alcohol use: No    Comment: rare  . Drug use: No     Allergies   Patient has no known allergies.   Review of Systems Review of Systems  HENT: Positive for dental problem.   Gastrointestinal: Positive for abdominal pain.  Genitourinary: Positive for vaginal bleeding. Negative for dysuria, frequency, hematuria, vaginal discharge and vaginal pain.  Neurological: Negative.  Negative for dizziness and light-headedness.     Physical Exam Triage Vital Signs ED Triage Vitals  Enc Vitals Group     BP 06/23/20 1505 120/70     Pulse Rate 06/23/20 1505 88     Resp 06/23/20 1505 16     Temp 06/23/20 1505 99.7 F (37.6 C)     Temp Source 06/23/20 1505 Oral     SpO2 06/23/20 1505 100 %     Weight --      Height --      Head Circumference --      Peak Flow --      Pain Score 06/23/20 1503 10     Pain Loc --      Pain Edu? --      Excl.  in GC? --    No data found.  Updated Vital Signs BP 120/70 (BP Location: Right Arm)   Pulse 88   Temp 99.7 F (37.6 C) (Oral)   Resp 16   LMP 06/22/2020 (Exact Date)   SpO2 100%   Breastfeeding No   Visual Acuity Right Eye Distance:   Left Eye Distance:   Bilateral Distance:    Right Eye Near:   Left Eye Near:    Bilateral Near:     Physical Exam Vitals and nursing note reviewed.  Constitutional:      General: She is not in acute distress.    Appearance: She is not ill-appearing.  HENT:     Mouth/Throat:     Mouth: Mucous membranes are moist.     Comments: Poor dental hygiene.  Severe gum recession involving the lower incisors Cardiovascular:     Rate and Rhythm: Normal rate and regular rhythm.  Pulmonary:     Effort: Pulmonary effort is normal.     Breath sounds: Normal breath sounds.  Abdominal:     General: Abdomen is flat. There is no distension.     Palpations: There is no mass.     Tenderness: There is no abdominal tenderness. There is no guarding or rebound.     Hernia: No hernia is present.  Musculoskeletal:        General: Normal range of motion.  Neurological:     Mental Status: She is alert.      UC Treatments / Results  Labs (all labs ordered are listed, but only abnormal results are displayed) Labs Reviewed  TSH  POC URINE PREG, ED    EKG   Radiology No results found.  Procedures Procedures (including critical care time)  Medications Ordered in UC Medications - No data to display  Initial Impression / Assessment and Plan / UC Course  I have reviewed the triage vital signs and the nursing notes.  Pertinent labs & imaging results that were available during my care of the patient were reviewed by me and considered in my medical decision making (see chart for details).     1.  Dental pain likely dental infection: Augmentin 875-125 mg twice daily for 7 days Ibuprofen  600 mg every 6 hours as needed for pain Patient is advised to  seek dental care Return precautions given.  2.  Menorrhagia with irregular cycles: TSH Tranexamic acid Patient is advised to follow-up with gynecologist If labs are abnormal we will call the patient There is no clinical indication of anemia.  Patient is hemodynamically stable and physical exam shows pink conjunctiva.  She has no signs or symptoms of anemia. Final Clinical Impressions(s) / UC Diagnoses   Final diagnoses:  Pain, dental  Menorrhagia with irregular cycle     Discharge Instructions     Please take medications as prescribed If symptoms worsen please return to urgent care to be reevaluated If you start feeling dizzy, lightheaded or near syncopal please return to urgent care Refer to the dental resources attached to this discharge paperwork to get some affordable dental care.   ED Prescriptions    Medication Sig Dispense Auth. Provider   ibuprofen (ADVIL) 600 MG tablet Take 1 tablet (600 mg total) by mouth every 8 (eight) hours as needed for moderate pain. 30 tablet Kaytlen Lightsey, Britta Mccreedy, MD   amoxicillin-clavulanate (AUGMENTIN) 875-125 MG tablet Take 1 tablet by mouth every 12 (twelve) hours. 14 tablet Mari Battaglia, Britta Mccreedy, MD   tranexamic acid (LYSTEDA) 650 MG TABS tablet Take 2 tablets (1,300 mg total) by mouth 3 (three) times daily for 5 days. 30 tablet Trestan Vahle, Britta Mccreedy, MD   lidocaine (XYLOCAINE) 2 % solution Use as directed 15 mLs in the mouth or throat as needed for mouth pain. 100 mL Lalah Durango, Britta Mccreedy, MD     PDMP not reviewed this encounter.   Merrilee Jansky, MD 06/24/20 1034

## 2020-12-13 ENCOUNTER — Emergency Department (HOSPITAL_COMMUNITY): Payer: Medicaid Other

## 2020-12-13 ENCOUNTER — Emergency Department (HOSPITAL_COMMUNITY)
Admission: EM | Admit: 2020-12-13 | Discharge: 2020-12-13 | Disposition: A | Discharge status: Home / Self Care | Payer: Medicaid Other | Attending: Emergency Medicine | Admitting: Emergency Medicine

## 2020-12-13 ENCOUNTER — Other Ambulatory Visit: Payer: Self-pay

## 2020-12-13 DIAGNOSIS — R1013 Epigastric pain: Secondary | ICD-10-CM | POA: Diagnosis present

## 2020-12-13 DIAGNOSIS — R1110 Vomiting, unspecified: Secondary | ICD-10-CM | POA: Diagnosis not present

## 2020-12-13 DIAGNOSIS — F17210 Nicotine dependence, cigarettes, uncomplicated: Secondary | ICD-10-CM | POA: Diagnosis not present

## 2020-12-13 DIAGNOSIS — R1012 Left upper quadrant pain: Secondary | ICD-10-CM | POA: Insufficient documentation

## 2020-12-13 DIAGNOSIS — R1084 Generalized abdominal pain: Secondary | ICD-10-CM

## 2020-12-13 DIAGNOSIS — R112 Nausea with vomiting, unspecified: Secondary | ICD-10-CM | POA: Diagnosis not present

## 2020-12-13 DIAGNOSIS — J45909 Unspecified asthma, uncomplicated: Secondary | ICD-10-CM | POA: Insufficient documentation

## 2020-12-13 DIAGNOSIS — A059 Bacterial foodborne intoxication, unspecified: Secondary | ICD-10-CM

## 2020-12-13 DIAGNOSIS — R1111 Vomiting without nausea: Secondary | ICD-10-CM | POA: Diagnosis not present

## 2020-12-13 LAB — COMPREHENSIVE METABOLIC PANEL
ALT: 17 U/L (ref 0–44)
AST: 27 U/L (ref 15–41)
Albumin: 4.4 g/dL (ref 3.5–5.0)
Alkaline Phosphatase: 67 U/L (ref 38–126)
Anion gap: 10 (ref 5–15)
BUN: 14 mg/dL (ref 6–20)
CO2: 23 mmol/L (ref 22–32)
Calcium: 9.8 mg/dL (ref 8.9–10.3)
Chloride: 103 mmol/L (ref 98–111)
Creatinine, Ser: 1.16 mg/dL — ABNORMAL HIGH (ref 0.44–1.00)
GFR, Estimated: >60 mL/min (ref >60)
Glucose, Bld: 85 mg/dL (ref 70–99)
Potassium: 3.6 mmol/L (ref 3.5–5.1)
Sodium: 136 mmol/L (ref 135–145)
Total Bilirubin: 0.5 mg/dL (ref 0.3–1.2)
Total Protein: 8.1 g/dL (ref 6.5–8.1)

## 2020-12-13 LAB — CBC WITH DIFFERENTIAL/PLATELET
Abs Immature Granulocytes: 0.05 10*3/uL (ref 0.00–0.07)
Basophils Absolute: 0 10*3/uL (ref 0.0–0.1)
Basophils Relative: 0 %
Eosinophils Absolute: 0.7 10*3/uL — ABNORMAL HIGH (ref 0.0–0.5)
Eosinophils Relative: 6 %
HCT: 42.3 % (ref 36.0–46.0)
Hemoglobin: 13.8 g/dL (ref 12.0–15.0)
Immature Granulocytes: 0 %
Lymphocytes Relative: 19 %
Lymphs Abs: 2.2 10*3/uL (ref 0.7–4.0)
MCH: 27 pg (ref 26.0–34.0)
MCHC: 32.6 g/dL (ref 30.0–36.0)
MCV: 82.8 fL (ref 80.0–100.0)
Monocytes Absolute: 0.5 10*3/uL (ref 0.1–1.0)
Monocytes Relative: 4 %
Neutro Abs: 7.9 10*3/uL — ABNORMAL HIGH (ref 1.7–7.7)
Neutrophils Relative %: 71 %
Platelets: 245 10*3/uL (ref 150–400)
RBC: 5.11 MIL/uL (ref 3.87–5.11)
RDW: 14.5 % (ref 11.5–15.5)
WBC: 11.4 10*3/uL — ABNORMAL HIGH (ref 4.0–10.5)
nRBC: 0 % (ref 0.0–0.2)

## 2020-12-13 LAB — URINALYSIS, ROUTINE W REFLEX MICROSCOPIC
Bilirubin Urine: NEGATIVE
Glucose, UA: NEGATIVE mg/dL
Hgb urine dipstick: NEGATIVE
Ketones, ur: NEGATIVE mg/dL
Nitrite: NEGATIVE
Protein, ur: 30 mg/dL — AB
Specific Gravity, Urine: 1.023 (ref 1.005–1.030)
pH: 6 (ref 5.0–8.0)

## 2020-12-13 LAB — I-STAT BETA HCG BLOOD, ED (MC, WL, AP ONLY): I-stat hCG, quantitative: <5 m[IU]/mL (ref <5)

## 2020-12-13 LAB — LIPASE, BLOOD: Lipase: 64 U/L — ABNORMAL HIGH (ref 11–51)

## 2020-12-13 MED ORDER — IOHEXOL 300 MG/ML  SOLN: 70.0000 mL | Freq: Once | INTRAMUSCULAR | Status: DC | PRN

## 2020-12-13 MED ORDER — ONDANSETRON HCL 4 MG PO TABS: 4.0000 mg | ORAL_TABLET | Freq: Three times a day (TID) | ORAL | 0 refills | Status: DC | PRN

## 2020-12-13 MED ORDER — SODIUM CHLORIDE 0.9 % IV BOLUS
1000.0000 mL | Freq: Once | INTRAVENOUS | Status: AC
  Administered 2020-12-13: 1000 mL via INTRAVENOUS

## 2020-12-13 MED ORDER — IOHEXOL 300 MG/ML  SOLN
70.0000 mL | Freq: Once | INTRAMUSCULAR | Status: AC | PRN
  Administered 2020-12-13: 70 mL via INTRAVENOUS

## 2020-12-13 MED ORDER — ONDANSETRON HCL 4 MG/2ML IJ SOLN
4.0000 mg | Freq: Once | INTRAMUSCULAR | Status: AC
  Administered 2020-12-13: 4 mg via INTRAVENOUS
  Filled 2020-12-13 (×2): qty 2

## 2020-12-13 NOTE — ED Provider Notes (Signed)
[facility] EMERGENCY DEPARTMENT Provider Note   CSN: [id number] Arrival date & time: 12/13/20  1954     History Chief Complaint  Patient presents with   Abdominal Pain   Nausea   Emesis    MARAL LAMPE is a 30 y.o. female.  30 year old female presents today for evaluation of abdominal pain, nausea, vomiting for 2-hour duration.  Patient reports she was out with a friend at a restaurant where she had baked fish and hamburger.  Since then both her and her friend have been nausea vomiting.  Per [facility] note EMS described her emesis as coffee-ground.  She does report that her emesis was dark and appeared like coffee grounds.  She denies any diarrhea or recent bright red blood per rectum or melanotic stools.  She endorses occasionally taking NSAIDs.  She denies recent or heavy alcohol use.  She currently denies chest pain, shortness of breath, lightheadedness.   The history is provided by the patient. No language interpreter was used.      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   Seizure disorder (HCC) 10/25/2012   Asthma, chronic 10/25/2012   Trichomonas 10/25/2012    Past Surgical History:  Procedure Laterality Date   NO PAST SURGERIES       OB History     Gravida  1   Para  1   Term  1   Preterm      AB      Living  1      SAB      IAB      Ectopic      Multiple      Live Births  1           Family History  Problem Relation Age of Onset   Diabetes Maternal Grandmother    Kidney disease Maternal Grandmother        dialysis    Social History   Tobacco Use   Smoking status: Every Day    Packs/day: 1.00    Types:  Cigarettes   Smokeless tobacco: Never   Tobacco comments:    quit with positive preg  Substance Use Topics   Alcohol use: No    Comment: rare   Drug use: No    Home Medications Prior to Admission medications   Medication Sig Start Date End Date Taking? Authorizing Provider  amoxicillin-clavulanate (AUGMENTIN) 875-125 MG tablet Take 1 tablet by mouth every 12 (twelve) hours. 06/23/20   Merrilee Jansky, MD  ibuprofen (ADVIL) 600 MG tablet Take 1 tablet (600 mg total) by mouth every 8 (eight) hours as needed for moderate pain. 06/23/20   Lamptey, Britta Mccreedy, MD  lidocaine (XYLOCAINE) 2 % solution Use as directed 15 mLs in the mouth or throat as needed for mouth pain. 06/23/20   Lamptey, Britta Mccreedy, MD    Allergies    Patient has no known allergies.  Review of Systems   Review of Systems  Constitutional:  Negative for chills and fever.  Respiratory:  Negative for shortness of breath.   Cardiovascular:  Negative for chest pain.  Gastrointestinal:  Positive for abdominal pain, nausea and vomiting. Negative for blood in stool, constipation and diarrhea.  Genitourinary:  Negative for difficulty urinating.  Neurological:  Negative for weakness and light-headedness.  All other systems reviewed and are negative.  Physical Exam Updated Vital Signs BP 122/71 (BP Location: Left Arm)   Pulse 95   Temp 97.7 F (36.5 C)   Resp 15   Ht 5\' 4"  (1.626 m)   Wt 45.4 kg   SpO2 100%   BMI 17.16 kg/m   Physical Exam Vitals and nursing note reviewed.  Constitutional:      General: She is not in acute distress.    Appearance: Normal appearance. She is not ill-appearing.  HENT:     Head: Normocephalic and atraumatic.     Nose: Nose normal.  Eyes:     General: No scleral icterus.    Extraocular Movements: Extraocular movements intact.     Conjunctiva/sclera: Conjunctivae normal.  Cardiovascular:     Rate and Rhythm: Normal rate and regular rhythm.     Pulses: Normal pulses.     Heart sounds:  Normal heart sounds.  Pulmonary:     Effort: Pulmonary effort is normal. No respiratory distress.     Breath sounds: Normal breath sounds. No wheezing or rales.  Abdominal:     General: There is no distension.     Tenderness: There is abdominal tenderness in the epigastric area, left upper quadrant and left lower quadrant. There is no right CVA tenderness or left CVA tenderness.  Musculoskeletal:        General: Normal range of motion.     Cervical back: Normal range of motion.  Skin:    General: Skin is warm and dry.  Neurological:     General: No focal deficit present.     Mental Status: She is alert. Mental status is at baseline.    ED Results / Procedures / Treatments   Labs (all labs ordered are listed, but only abnormal results are displayed) Labs Reviewed  CBC WITH DIFFERENTIAL/PLATELET - Abnormal; Notable for the following components:      Result Value   WBC 11.4 (*)    Neutro Abs 7.9 (*)    Eosinophils Absolute 0.7 (*)    All other components within normal limits  URINALYSIS, ROUTINE W REFLEX MICROSCOPIC - Abnormal; Notable for the following components:   APPearance HAZY (*)    Protein, ur 30 (*)    Leukocytes,Ua TRACE (*)    Bacteria, UA RARE (*)    All other components within normal limits  COMPREHENSIVE METABOLIC PANEL  LIPASE, BLOOD  I-STAT BETA HCG BLOOD, ED ([facility], [facility], [facility] ONLY)    EKG None  Radiology No results found.  Procedures Procedures   Medications Ordered in ED Medications  sodium chloride 0.9 % bolus 1,000 mL (has no administration in time range)  ondansetron (ZOFRAN) injection 4 mg (has no administration in time range)    ED Course  I have reviewed the triage vital signs and the nursing notes.  Pertinent labs & imaging results that were available during my care of the patient were reviewed by me and considered in my medical decision making (see chart for details).    MDM Rules/Calculators/A&P                           31 year old  female presents for evaluation of abdominal pain, nausea, vomiting after eating baked fish and hamburger for dinner.  26  presents to the emergency room as well with similar symptoms.  There was concern per EMS that patient had coffee-ground emesis.  Patient's initial work-up was significant for stable hemoglobin without anemia, mild leukocytosis likely reactive given vomiting, mild elevation in creatinine likely from dehydration.  Lipase elevated at 64.  Patient on abdominal exam has tenderness to palpation over the epigastric, left upper and left lower quadrants.  If pregnancy test is negative we will proceed with CT abdomen pelvis.  Patient stable vital signs are reassuring against acute GI bleed.  I believe what was described as coffee-ground emesis was more likely the hamburger patient ate right before her episodes of vomiting.   2200: CT abdomen pelvis without acute findings.  Patient reports she is feeling better following Zofran and a liter bolus.  Given low concern for GI bleed do not feel we need to repeat hemoglobin.  Did discuss return precautions with patient at length.  Patient voices understanding and is in agreement with plan.  We will give patient Zofran to keep on hand for ongoing symptoms of nausea vomiting.  Final Clinical Impression(s) / ED Diagnoses Final diagnoses:  None    Rx / DC Orders ED Discharge Orders     None        Marita Kansas, PA-C 12/13/20 2222    Ernie Avena, MD 12/13/20 2241

## 2020-12-13 NOTE — ED Provider Notes (Signed)
Emergency Medicine Provider Triage Evaluation Note  MARYLOU WAGES , a 30 y.o. female  was evaluated in triage.  Pt complains of nausea and vomiting.  Presents with another patient who they both ate fish about an hour ago and are both presenting with nausea and vomiting.  However per EMS the patient has had coffee-ground emesis with the majority of her episodes of vomiting.  Denies diarrhea.  Review of Systems  Positive: See above Negative:   Physical Exam  There were no vitals taken for this visit. Gen:   Awake, no distress, crying during exam Resp:  Normal effort  MSK:   Moves extremities without difficulty  Other:  Abdomen is flat, soft, tenderness to palpation.  No rebound.  Medical Decision Making  Medically screening exam initiated at 7:55 PM.  Appropriate orders placed.  ENDA SANTO was informed that the remainder of the evaluation will be completed by another provider, this initial triage assessment does not replace that evaluation, and the importance of remaining in the ED until their evaluation is complete.     Cristopher Peru, PA-C 12/13/20 Margarette Asal, MD 12/13/20 2102

## 2020-12-13 NOTE — ED Triage Notes (Signed)
Pt c/o abd pain, nausea, and vomiting after eating some fish tonight.

## 2020-12-13 NOTE — ED Notes (Signed)
Pt left prior to receiving dc paperwork

## 2020-12-13 NOTE — Discharge Instructions (Signed)
Your blood work today was reassuring.  He had a CT scan of your abdomen done in the emergency department which was normal.  You likely have food poisoning from what you ate earlier.  I have sent in Zofran to the pharmacy for you to keep on hand for ongoing nausea.  If you develop fever, worsening abdominal pain, nausea vomiting to the point you are unable to keep any food or drink down despite taking Zofran please return to the emergency room.  I do recommend you drink plenty of fluids to stay hydrated.

## 2020-12-14 ENCOUNTER — Telehealth: Payer: Self-pay

## 2020-12-14 NOTE — Telephone Encounter (Signed)
Transition Care Management Unsuccessful Follow-up Telephone Call  Date of discharge and from where:  12/13/2020 from [facility]  Attempts:  1st Attempt  Reason for unsuccessful TCM follow-up call:  Left voice message

## 2020-12-16 NOTE — Telephone Encounter (Signed)
Transition Care Management Unsuccessful Follow-up Telephone Call  Date of discharge and from where:  12/13/2020 [place]  Attempts:  2nd Attempt  Reason for unsuccessful TCM follow-up call:  Left voice message    

## 2020-12-19 NOTE — Telephone Encounter (Signed)
Transition Care Management Follow-up Telephone Call Date of discharge and from where: 12/13/2020 from [facility] How have you been since you were released from the hospital? Pt stated that she is feeling much better and did not have any questions or concerns.  Any questions or concerns? No  Items Reviewed: Did the pt receive and understand the discharge instructions provided? Yes  Medications obtained and verified? Yes  Other? No  Any new allergies since your discharge? No  Dietary orders reviewed? No Do you have support at home? Yes   Functional Questionnaire: (I = Independent and D = Dependent) ADLs: I  Bathing/Dressing- I  Meal Prep- I  Eating- I  Maintaining continence- I  Transferring/Ambulation- I  Managing Meds- I   Follow up appointments reviewed:  PCP Hospital f/u appt confirmed? No   Specialist Hospital f/u appt confirmed? No   Are transportation arrangements needed? No  If their condition worsens, is the pt aware to call PCP or go to the Emergency Dept.? Yes Was the patient provided with contact information for the PCP's office or ED? Yes Was to pt encouraged to call back with questions or concerns? Yes

## 2021-02-11 ENCOUNTER — Emergency Department (HOSPITAL_COMMUNITY)
Admission: EM | Admit: 2021-02-11 | Discharge: 2021-02-11 | Disposition: A | Discharge status: Home / Self Care | Payer: Medicaid Other | Attending: Emergency Medicine | Admitting: Emergency Medicine

## 2021-02-11 ENCOUNTER — Other Ambulatory Visit: Payer: Self-pay

## 2021-02-11 ENCOUNTER — Encounter (HOSPITAL_COMMUNITY): Payer: Self-pay | Admitting: Emergency Medicine

## 2021-02-11 DIAGNOSIS — K0889 Other specified disorders of teeth and supporting structures: Secondary | ICD-10-CM | POA: Insufficient documentation

## 2021-02-11 DIAGNOSIS — H1033 Unspecified acute conjunctivitis, bilateral: Secondary | ICD-10-CM | POA: Insufficient documentation

## 2021-02-11 DIAGNOSIS — G8929 Other chronic pain: Secondary | ICD-10-CM | POA: Diagnosis not present

## 2021-02-11 DIAGNOSIS — H5713 Ocular pain, bilateral: Secondary | ICD-10-CM | POA: Diagnosis present

## 2021-02-11 MED ORDER — IBUPROFEN 400 MG PO TABS
400.0000 mg | ORAL_TABLET | Freq: Once | ORAL | Status: AC
  Administered 2021-02-11: 400 mg via ORAL
  Filled 2021-02-11: qty 1

## 2021-02-11 MED ORDER — SODIUM CHLORIDE 0.9 % IV SOLN: 1.0000 g | Freq: Once | INTRAVENOUS | Status: DC

## 2021-02-11 MED ORDER — SODIUM CHLORIDE 0.9 % IV SOLN: 500.0000 mg | Freq: Once | INTRAVENOUS | Status: DC

## 2021-02-11 MED ORDER — TOBRAMYCIN 0.3 % OP SOLN
2.0000 [drp] | OPHTHALMIC | Status: DC
  Administered 2021-02-11: 2 [drp] via OPHTHALMIC
  Filled 2021-02-11: qty 5

## 2021-02-11 MED ORDER — LACTATED RINGERS IV BOLUS: 1000.0000 mL | Freq: Once | INTRAVENOUS | Status: DC

## 2021-02-11 NOTE — ED Provider Notes (Signed)
[facility] EMERGENCY DEPARTMENT Provider Note   CSN: [id number] Arrival date & time: 02/11/21  1004     History  Chief Complaint  Patient presents with   Dental Pain   Eye Pain    [name] is a 31 y.o. female.  HPI She presents for evaluation of dental pain for 3 weeks.  No recent dental care.  Prior evaluation urgent care for similar problem, several months ago.  Pain is ongoing and "get me up last night."  She also noted some itchiness and drainage from her eyes this morning.    Home Medications Prior to Admission medications   Medication Sig Start Date End Date Taking? Authorizing Provider  amoxicillin-clavulanate (AUGMENTIN) 875-125 MG tablet Take 1 tablet by mouth every 12 (twelve) hours. 06/23/20   Merrilee Jansky, MD  ibuprofen (ADVIL) 600 MG tablet Take 1 tablet (600 mg total) by mouth every 8 (eight) hours as needed for moderate pain. 06/23/20   Lamptey, Britta Mccreedy, MD  lidocaine (XYLOCAINE) 2 % solution Use as directed 15 mLs in the mouth or throat as needed for mouth pain. 06/23/20   Lamptey, Britta Mccreedy, MD  ondansetron (ZOFRAN) 4 MG tablet Take 1 tablet (4 mg total) by mouth every 8 (eight) hours as needed for nausea or vomiting. 12/13/20   Marita Kansas, PA-C      Allergies    Patient has no known allergies.    Review of Systems   Review of Systems  All other systems reviewed and are negative.  Physical Exam Updated Vital Signs BP 107/73 (BP Location: Left Arm)    Pulse 72    Temp 97.7 F (36.5 C) (Oral)    Resp 15    LMP 01/20/2021    SpO2 100%  Physical Exam Vitals and nursing note reviewed.  Constitutional:      General: She is not in acute distress.    Appearance: She is well-developed. She is not ill-appearing or diaphoretic.  HENT:     Head: Normocephalic and atraumatic.     Right Ear: External ear normal.     Left Ear: External ear normal.  Eyes:     Extraocular Movements: Extraocular movements intact.      Conjunctiva/sclera: Conjunctivae normal.     Pupils: Pupils are equal, round, and reactive to light.     Comments: Bilateral conjunctivitis with drainage, sclera and lids, discharge is opaque and slightly discolored.  Neck:     Trachea: Phonation normal.  Cardiovascular:     Rate and Rhythm: Normal rate.  Pulmonary:     Effort: Pulmonary effort is normal.  Abdominal:     Tenderness: There is no abdominal tenderness.  Musculoskeletal:        General: Normal range of motion.     Cervical back: Normal range of motion and neck supple.  Skin:    General: Skin is warm and dry.  Neurological:     Mental Status: She is alert and oriented to person, place, and time.     Cranial Nerves: No cranial nerve deficit.     Sensory: No sensory deficit.     Motor: No abnormal muscle tone.     Coordination: Coordination normal.  Psychiatric:        Mood and Affect: Mood normal.        Behavior: Behavior normal.        Thought Content: Thought content normal.        Judgment: Judgment normal.  ED Results / Procedures / Treatments   Labs (all labs ordered are listed, but only abnormal results are displayed) Labs Reviewed - No data to display  EKG None  Radiology No results found.  Procedures Procedures    Medications Ordered in ED Medications  ibuprofen (ADVIL) tablet 400 mg (has no administration in time range)  tobramycin (TOBREX) 0.3 % ophthalmic solution 2 drop (has no administration in time range)    ED Course/ Medical Decision Making/ A&P                           Medical Decision Making Patient presenting with complaint of dental pain for 3 weeks, and noticed eye drainage and itching today.  The latter seems to be driving her concern for coming in.  Previously evaluated and treated in urgent care for dental pain with ibuprofen, Xylocaine, and Augmentin.  Problems Addressed: Acute conjunctivitis of both eyes, unspecified acute conjunctivitis type: undiagnosed new problem  with uncertain prognosis    Details: Bilateral eye drainage, with erythema, consistent with bacterial infection uncomplicated. Chronic dental pain: chronic illness or injury    Details: Prior evaluation for same recently  Amount and/or Complexity of Data Reviewed Independent Historian:     Details: Clinical evaluation. External Data Reviewed: notes.    Details: Urgent care notes when evaluated for dental pain, November 2022.  Details above.  Risk OTC drugs. Prescription drug management. Risk Details: Initiated treatment for bacterial conjunctivitis in the ED.  Symptomatic care for dental pain started with ibuprofen.  Patient given referral to dentistry and instructed on care for eye infection.           Final Clinical Impression(s) / ED Diagnoses Final diagnoses:  Chronic dental pain  Acute conjunctivitis of both eyes, unspecified acute conjunctivitis type    Rx / DC Orders ED Discharge Orders     None         Mancel Bale, MD 02/11/21 1249

## 2021-02-11 NOTE — ED Triage Notes (Signed)
C/o left lower dental pain x 3-4 weeks.  Also reports L eye pain/burning and redness since this morning.

## 2021-02-11 NOTE — Discharge Instructions (Signed)
Use the tobramycin drops 2 in each eye, every 2 hours, until the redness and drainage improves.  You can use a warm compress on the eyes to help clear the discharge out.  For dental pain, take ibuprofen, 400 mg, 3 times a day with meals.  Call the dentist for a follow-up appointment to be seen and get treatment for your dental problems.

## 2021-02-13 ENCOUNTER — Telehealth: Payer: Self-pay

## 2021-02-13 NOTE — Telephone Encounter (Signed)
Transition Care Management Follow-up Telephone Call Date of discharge and from where: 02/11/2021-[place]  How have you been since you were released from the hospital? Pt stated she she is doing ok.  Any questions or concerns? Yes  Items Reviewed: Did the pt receive and understand the discharge instructions provided? Yes  Medications obtained and verified?  No medications sent to pharmacy at discharge  Other? No  Any new allergies since your discharge? No  Dietary orders reviewed? No Do you have support at home? Yes   Home Care and Equipment/Supplies: Were home health services ordered? not applicable If so, what is the name of the agency? N/A  Has the agency set up a time to come to the patient's home? not applicable Were any new equipment or medical supplies ordered?  No What is the name of the medical supply agency? N/A Were you able to get the supplies/equipment? not applicable Do you have any questions related to the use of the equipment or supplies? No  Functional Questionnaire: (I = Independent and D = Dependent) ADLs: I  Bathing/Dressing- I  Meal Prep- I  Eating- I  Maintaining continence- I  Transferring/Ambulation- I  Managing Meds- I  Follow up appointments reviewed:  PCP Hospital f/u appt confirmed? No   Specialist Hospital f/u appt confirmed? No   Are transportation arrangements needed? No  If their condition worsens, is the pt aware to call PCP or go to the Emergency Dept.? Yes Was the patient provided with contact information for the PCP's office or ED? Yes Was to pt encouraged to call back with questions or concerns? Yes

## 2021-03-02 ENCOUNTER — Emergency Department (HOSPITAL_COMMUNITY)
Admission: EM | Admit: 2021-03-02 | Discharge: 2021-03-04 | Disposition: A | Discharge status: Other Acute Inpatient Hospital | Payer: Medicaid Other | Source: Home / Self Care | Attending: Emergency Medicine | Admitting: Emergency Medicine

## 2021-03-02 ENCOUNTER — Encounter (HOSPITAL_COMMUNITY): Payer: Self-pay | Admitting: Emergency Medicine

## 2021-03-02 DIAGNOSIS — F12959 Cannabis use, unspecified with psychotic disorder, unspecified: Secondary | ICD-10-CM | POA: Insufficient documentation

## 2021-03-02 DIAGNOSIS — R45851 Suicidal ideations: Secondary | ICD-10-CM | POA: Insufficient documentation

## 2021-03-02 DIAGNOSIS — Y900 Blood alcohol level of less than 20 mg/100 ml: Secondary | ICD-10-CM | POA: Insufficient documentation

## 2021-03-02 DIAGNOSIS — F1914 Other psychoactive substance abuse with psychoactive substance-induced mood disorder: Secondary | ICD-10-CM | POA: Insufficient documentation

## 2021-03-02 DIAGNOSIS — Z20822 Contact with and (suspected) exposure to covid-19: Secondary | ICD-10-CM | POA: Insufficient documentation

## 2021-03-02 DIAGNOSIS — N9489 Other specified conditions associated with female genital organs and menstrual cycle: Secondary | ICD-10-CM | POA: Insufficient documentation

## 2021-03-02 LAB — COMPREHENSIVE METABOLIC PANEL
ALT: 18 U/L (ref 0–44)
AST: 26 U/L (ref 15–41)
Albumin: 4 g/dL (ref 3.5–5.0)
Alkaline Phosphatase: 62 U/L (ref 38–126)
Anion gap: 10 (ref 5–15)
BUN: 15 mg/dL (ref 6–20)
CO2: 24 mmol/L (ref 22–32)
Calcium: 9.4 mg/dL (ref 8.9–10.3)
Chloride: 105 mmol/L (ref 98–111)
Creatinine, Ser: 1.33 mg/dL — ABNORMAL HIGH (ref 0.44–1.00)
GFR, Estimated: 55 mL/min — ABNORMAL LOW (ref >60)
Glucose, Bld: 115 mg/dL — ABNORMAL HIGH (ref 70–99)
Potassium: 3.4 mmol/L — ABNORMAL LOW (ref 3.5–5.1)
Sodium: 139 mmol/L (ref 135–145)
Total Bilirubin: 0.2 mg/dL — ABNORMAL LOW (ref 0.3–1.2)
Total Protein: 7.3 g/dL (ref 6.5–8.1)

## 2021-03-02 LAB — RAPID URINE DRUG SCREEN, HOSP PERFORMED
Amphetamines: NOT DETECTED
Barbiturates: NOT DETECTED
Benzodiazepines: NOT DETECTED
Cocaine: POSITIVE — AB
Opiates: NOT DETECTED
Tetrahydrocannabinol: POSITIVE — AB

## 2021-03-02 LAB — CBC WITH DIFFERENTIAL/PLATELET
Abs Immature Granulocytes: 0.01 10*3/uL (ref 0.00–0.07)
Basophils Absolute: 0 10*3/uL (ref 0.0–0.1)
Basophils Relative: 1 %
Eosinophils Absolute: 0.6 10*3/uL — ABNORMAL HIGH (ref 0.0–0.5)
Eosinophils Relative: 10 %
HCT: 38.7 % (ref 36.0–46.0)
Hemoglobin: 12.3 g/dL (ref 12.0–15.0)
Immature Granulocytes: 0 %
Lymphocytes Relative: 42 %
Lymphs Abs: 2.4 10*3/uL (ref 0.7–4.0)
MCH: 26.6 pg (ref 26.0–34.0)
MCHC: 31.8 g/dL (ref 30.0–36.0)
MCV: 83.8 fL (ref 80.0–100.0)
Monocytes Absolute: 0.4 10*3/uL (ref 0.1–1.0)
Monocytes Relative: 6 %
Neutro Abs: 2.4 10*3/uL (ref 1.7–7.7)
Neutrophils Relative %: 41 %
Platelets: 259 10*3/uL (ref 150–400)
RBC: 4.62 MIL/uL (ref 3.87–5.11)
RDW: 14.7 % (ref 11.5–15.5)
WBC: 5.9 10*3/uL (ref 4.0–10.5)
nRBC: 0 % (ref 0.0–0.2)

## 2021-03-02 LAB — ACETAMINOPHEN LEVEL: Acetaminophen (Tylenol), Serum: <10 ug/mL — ABNORMAL LOW (ref 10–30)

## 2021-03-02 LAB — ETHANOL: Alcohol, Ethyl (B): <10 mg/dL (ref <10)

## 2021-03-02 LAB — I-STAT BETA HCG BLOOD, ED (MC, WL, AP ONLY): I-stat hCG, quantitative: <5 m[IU]/mL (ref <5)

## 2021-03-02 LAB — SALICYLATE LEVEL: Salicylate Lvl: <7 mg/dL — ABNORMAL LOW (ref 7.0–30.0)

## 2021-03-02 NOTE — ED Provider Triage Note (Signed)
Emergency Medicine Provider Triage Evaluation Note  [name] , a 31 y.o. female  was evaluated in triage.  Patient presents to the ED via [employer] under IVC.  Per IVC paperwork, "respondent is talking about killing herself tonight and that no one will help her.  She is screaming, hitting her head violently with her hands, and crying uncontrollably.  Stated tonight she does not care about living.  Respondent drinks alcohol heavily and does cocaine."  Patient denies any recent alcohol or drug use to me.  She states that "she has not drank alcohol for more than 2 weeks".  She states that at times she smokes weed but denies any other illicit drug use.  She tells me that "her mother tried to take her child away and stating that she wants to hurt herself because she has nowhere to go".  Denies any SI to me.  Denies any HI or visual/auditory hallucinations.  Denies any physical complaints.  Physical Exam  There were no vitals taken for this visit. Gen:   Awake, tearful Resp:  Normal effort  MSK:   Moves extremities without difficulty Other:    Medical Decision Making  Medically screening exam initiated at 11:08 PM.  Appropriate orders placed.  [name] was informed that the remainder of the evaluation will be completed by another provider, this initial triage assessment does not replace that evaluation, and the importance of remaining in the ED until their evaluation is complete.   Placido Sou, PA-C 03/02/21 2312

## 2021-03-02 NOTE — ED Triage Notes (Signed)
Patient arrived with 2 [employer] officers IVC , IVC papers reports suicidal ideation with ETOH and Cocaine addiction with self injurious behavior - hitting self .

## 2021-03-03 LAB — RESP PANEL BY RT-PCR (FLU A&B, COVID) ARPGX2
Influenza A by PCR: NEGATIVE
Influenza B by PCR: NEGATIVE
SARS Coronavirus 2 by RT PCR: NEGATIVE

## 2021-03-03 MED ORDER — ALUM & MAG HYDROXIDE-SIMETH 200-200-20 MG/5ML PO SUSP: 30.0000 mL | Freq: Four times a day (QID) | ORAL | Status: DC | PRN

## 2021-03-03 MED ORDER — ACETAMINOPHEN 325 MG PO TABS: 650.0000 mg | ORAL_TABLET | ORAL | Status: DC | PRN

## 2021-03-03 MED ORDER — ONDANSETRON HCL 4 MG PO TABS: 4.0000 mg | ORAL_TABLET | Freq: Three times a day (TID) | ORAL | Status: DC | PRN

## 2021-03-03 NOTE — BH Assessment (Signed)
Clinician to complete pt's assessment once IVC paperwork is received. Fax number [phone]) provided to Blossom Hoops, RN.    Redmond Pulling, MS, [facility], [facility] Triage Specialist [phone]

## 2021-03-03 NOTE — ED Notes (Signed)
1 personal property bag placed in locker #1

## 2021-03-03 NOTE — ED Notes (Signed)
Tele psych machine to bedside, awaiting [name] assessment.

## 2021-03-03 NOTE — Progress Notes (Signed)
Patient has been faxed out due to staffing issues at [facility]. Patient meets BH inpatient criteria per Liborio Nixon, NP. Patient has been faxed out to the following facilities:   [facility]  [address], [place] [address] [phone]  [facility]  [address] [place] [address] [phone]  [facility]  [address], [place] [address] [phone]  [facility]  [address] [place] [address] [phone]  [facility]  [address], [place] [address] [phone]  [facility]  [address] [place], [place] [address] [phone]  [facility]  [address] [place], [place] [address] [phone]  [facility]  [address] [place] [address] [phone]  [facility]  [phone] [place] [address] [phone]  [facility]  [address] [place] [address] [phone]  [facility]  [address] [place] [address] [phone]  [facility]  [address] [place] [address] [phone]   Damita Dunnings, MSW, LCSW-A  9:59 PM 03/03/2021

## 2021-03-03 NOTE — ED Provider Notes (Signed)
[facility] EMERGENCY DEPARTMENT Provider Note   CSN: [id number] Arrival date & time: 03/02/21  2251     History  Chief Complaint  Patient presents with   IVC /SI    ETOH/Cocaine addiction     [name] is a 30 y.o. female.  Patient was involuntarily committed by her mother secondary to suicidal threats.  Also alcohol and drug use.  Patient denies alcohol and drug use to me but in the triage note is states that she did state some suicidal thoughts.  Patient is denying that now as well.  Cannot get a hold of her mother for collateral information.  Patient states that she thinks her mom is sure to take her child from her and maybe that is why she had her committed.       Home Medications Prior to Admission medications   Medication Sig Start Date End Date Taking? Authorizing Provider  amoxicillin-clavulanate (AUGMENTIN) 875-125 MG tablet Take 1 tablet by mouth every 12 (twelve) hours. 06/23/20   Merrilee Jansky, MD  ibuprofen (ADVIL) 600 MG tablet Take 1 tablet (600 mg total) by mouth every 8 (eight) hours as needed for moderate pain. 06/23/20   Lamptey, Britta Mccreedy, MD  lidocaine (XYLOCAINE) 2 % solution Use as directed 15 mLs in the mouth or throat as needed for mouth pain. 06/23/20   Lamptey, Britta Mccreedy, MD  ondansetron (ZOFRAN) 4 MG tablet Take 1 tablet (4 mg total) by mouth every 8 (eight) hours as needed for nausea or vomiting. 12/13/20   Marita Kansas, PA-C      Allergies    Patient has no known allergies.    Review of Systems   Review of Systems  Physical Exam Updated Vital Signs There were no vitals taken for this visit. Physical Exam Vitals and nursing note reviewed.  Constitutional:      Appearance: She is well-developed.  HENT:     Head: Normocephalic and atraumatic.     Mouth/Throat:     Mouth: Mucous membranes are moist.     Pharynx: Oropharynx is clear.  Eyes:     Pupils: Pupils are equal, round, and reactive to light.  Cardiovascular:      Rate and Rhythm: Normal rate and regular rhythm.  Pulmonary:     Effort: Pulmonary effort is normal. No respiratory distress.     Breath sounds: No stridor.  Abdominal:     General: Abdomen is flat. There is no distension.  Musculoskeletal:        General: No swelling or tenderness. Normal range of motion.     Cervical back: Normal range of motion.  Skin:    General: Skin is warm and dry.  Neurological:     General: No focal deficit present.     Mental Status: She is alert.    ED Results / Procedures / Treatments   Labs (all labs ordered are listed, but only abnormal results are displayed) Labs Reviewed  COMPREHENSIVE METABOLIC PANEL - Abnormal; Notable for the following components:      Result Value   Potassium 3.4 (*)    Glucose, Bld 115 (*)    Creatinine, Ser 1.33 (*)    Total Bilirubin 0.2 (*)    GFR, Estimated 55 (*)    All other components within normal limits  RAPID URINE DRUG SCREEN, HOSP PERFORMED - Abnormal; Notable for the following components:   Cocaine POSITIVE (*)    Tetrahydrocannabinol POSITIVE (*)    All other components  within normal limits  CBC WITH DIFFERENTIAL/PLATELET - Abnormal; Notable for the following components:   Eosinophils Absolute 0.6 (*)    All other components within normal limits  SALICYLATE LEVEL - Abnormal; Notable for the following components:   Salicylate Lvl <7.0 (*)    All other components within normal limits  ACETAMINOPHEN LEVEL - Abnormal; Notable for the following components:   Acetaminophen (Tylenol), Serum <10 (*)    All other components within normal limits  RESP PANEL BY RT-PCR (FLU A&B, COVID) ARPGX2  ETHANOL  I-STAT BETA HCG BLOOD, ED ([facility], [facility], [facility] ONLY)    EKG None  Radiology No results found.  Procedures Procedures    Medications Ordered in ED Medications  acetaminophen (TYLENOL) tablet 650 mg (has no administration in time range)  ondansetron (ZOFRAN) tablet 4 mg (has no administration in time range)   alum & mag hydroxide-simeth (MAALOX/MYLANTA) 200-200-20 MG/5ML suspension 30 mL (has no administration in time range)    ED Course/ Medical Decision Making/ A&P                           Medical Decision Making Risk OTC drugs. Prescription drug management.   Can't get collateral. Patient not honest about drug use. Also story is changing with different providers. 24 hour exam form completed. Patient overall appears well and is medically cleared for TTS consultation and recommendations.   Final Clinical Impression(s) / ED Diagnoses Final diagnoses:  None    Rx / DC Orders ED Discharge Orders     None         Kiara Mcdowell, Barbara Cower, MD 03/03/21 [phone]
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
## 2021-03-03 NOTE — ED Notes (Signed)
Secretary to fax IVC papers to [facility] Assessment per counselor Ann Maki request at number provided [phone]

## 2021-03-03 NOTE — Progress Notes (Signed)
Visited with patient. Patient was crying when asked why?  She indicated that she wanted her baby and ceased talking and started to cry again. Chaplain provided emotional support.  Available as needed.  Venida Jarvis, [place], [facility], Pager [phone]

## 2021-03-03 NOTE — BH Assessment (Signed)
Received IVC paperwork via fax from RN. Will review with NP.

## 2021-03-03 NOTE — BHH Counselor (Signed)
TTS attempted to contact the patient's nurse @ 10:40am to conduct TTS assessment. No response TTS will attempt to assess later this date.

## 2021-03-03 NOTE — BH Assessment (Signed)
Clinician messaged Blossom Hoops, RN: "Hey. It's Trey with TTS. Is the pt able to engage in the assessment, if so the pt will need to be placed in a private room. Also is the pt under IVC?"   Clinician awaiting response.    Redmond Pulling, MS, [facility], [facility] Triage Specialist [phone]

## 2021-03-04 ENCOUNTER — Encounter (HOSPITAL_COMMUNITY): Payer: Self-pay | Admitting: Psychiatry

## 2021-03-04 ENCOUNTER — Other Ambulatory Visit: Payer: Self-pay

## 2021-03-04 ENCOUNTER — Inpatient Hospital Stay (HOSPITAL_COMMUNITY)
Admission: AD | Admit: 2021-03-04 | Discharge: 2021-03-08 | DRG: 897 | Disposition: A | Discharge status: Home / Self Care | Payer: Medicaid Other | Source: Intra-hospital | Attending: Physician Assistant | Admitting: Physician Assistant

## 2021-03-04 DIAGNOSIS — Z20822 Contact with and (suspected) exposure to covid-19: Secondary | ICD-10-CM | POA: Diagnosis present

## 2021-03-04 DIAGNOSIS — R45851 Suicidal ideations: Secondary | ICD-10-CM | POA: Diagnosis present

## 2021-03-04 DIAGNOSIS — F1510 Other stimulant abuse, uncomplicated: Secondary | ICD-10-CM | POA: Diagnosis present

## 2021-03-04 DIAGNOSIS — F1410 Cocaine abuse, uncomplicated: Secondary | ICD-10-CM | POA: Diagnosis present

## 2021-03-04 DIAGNOSIS — F819 Developmental disorder of scholastic skills, unspecified: Secondary | ICD-10-CM | POA: Diagnosis present

## 2021-03-04 DIAGNOSIS — F1994 Other psychoactive substance use, unspecified with psychoactive substance-induced mood disorder: Principal | ICD-10-CM | POA: Diagnosis present

## 2021-03-04 DIAGNOSIS — F17210 Nicotine dependence, cigarettes, uncomplicated: Secondary | ICD-10-CM | POA: Diagnosis present

## 2021-03-04 MED ORDER — ALUM & MAG HYDROXIDE-SIMETH 200-200-20 MG/5ML PO SUSP: 30.0000 mL | ORAL | Status: DC | PRN

## 2021-03-04 MED ORDER — ENSURE ENLIVE PO LIQD
237.0000 mL | Freq: Two times a day (BID) | ORAL | Status: DC
  Administered 2021-03-04 – 2021-03-07 (×2): 237 mL via ORAL
  Filled 2021-03-04 (×10): qty 237

## 2021-03-04 MED ORDER — TRAZODONE HCL 50 MG PO TABS
50.0000 mg | ORAL_TABLET | Freq: Every evening | ORAL | Status: DC | PRN
  Administered 2021-03-07: 50 mg via ORAL
  Filled 2021-03-04 (×3): qty 1

## 2021-03-04 MED ORDER — INFLUENZA VAC SPLIT QUAD 0.5 ML IM SUSY
0.5000 mL | PREFILLED_SYRINGE | INTRAMUSCULAR | Status: DC
  Filled 2021-03-04: qty 0.5

## 2021-03-04 MED ORDER — ACETAMINOPHEN 325 MG PO TABS
650.0000 mg | ORAL_TABLET | Freq: Four times a day (QID) | ORAL | Status: DC | PRN
  Administered 2021-03-04: 650 mg via ORAL
  Filled 2021-03-04: qty 2

## 2021-03-04 MED ORDER — NICOTINE POLACRILEX 2 MG MT GUM
2.0000 mg | CHEWING_GUM | OROMUCOSAL | Status: DC | PRN
  Administered 2021-03-04 – 2021-03-05 (×2): 2 mg via ORAL
  Filled 2021-03-04 (×2): qty 1

## 2021-03-04 MED ORDER — MAGNESIUM HYDROXIDE 400 MG/5ML PO SUSP: 30.0000 mL | Freq: Every day | ORAL | Status: DC | PRN

## 2021-03-04 MED ORDER — HYDROXYZINE HCL 25 MG PO TABS
25.0000 mg | ORAL_TABLET | Freq: Three times a day (TID) | ORAL | Status: DC | PRN
  Administered 2021-03-05 (×2): 25 mg via ORAL
  Filled 2021-03-04 (×2): qty 1

## 2021-03-04 NOTE — ED Provider Notes (Signed)
Emergency Medicine Observation Re-evaluation Note  RIM THATCH is a 31 y.o. female, seen on rounds today.  Pt initially presented to the ED for complaints of Suicidal, IVC, and Addiction Problem Currently, the patient is resting comfortably in bed. Patient is going to [facility] this morning at 9:30 AM  Physical Exam  BP 114/76 (BP Location: Left Arm)    Pulse (!) 105    Temp 98.7 F (37.1 C) (Oral)    Resp 16    SpO2 100%  Physical Exam General: resting comfortably in bed Cardiac: HR 105 Lungs: normal effort Psych: no signs of agitation   ED Course / MDM  EKG:   I have reviewed the labs performed to date as well as medications administered while in observation.  Recent changes in the last 24 hours include None  Plan  Current plan is for going to [facility] at 9:30 this morning. Transport has already been called by [employer] fundraiser. MALLARY KREGER is under involuntary commitment.      Mannie Stabile, PA-C 03/04/21 5027    Pricilla Loveless, MD 03/04/21 1009

## 2021-03-04 NOTE — Progress Notes (Signed)
Patient is a 31 year old female who presented under IVC from [facility] for making suicidal threats (per mother). Patient presented as very tearful, anxious, soft spoken, but was calm and cooperative during admission interview and assessment. Admission paperwork completed and signed. Pt denied SI/HI and A/VH, and did not appear to be responding to internal stimuli. Pt denied alcohol and drug use, other than smoking weed, stating "that coke must've been in the blunt" VS monitored and recorded. Skin check revealed no abnormalities. Belongings searched and secured in locker.  Patient was oriented to unit and schedule. Q 15 min checks initiated for safety.

## 2021-03-04 NOTE — ED Notes (Signed)
[employer] contacted, transportation requested

## 2021-03-04 NOTE — Group Note (Signed)
LCSW Group Therapy Note  03/04/2021    10:00-11:00am   Topic:  Anger Healthy and Unhealthy Coping Skills  Participation Level:  None  Description of Group:   In this group, patients identified their own common triggers and typical reactions then analyzed how these reactions are possibly beneficial and possibly unhelpful.  Focus was placed on examining whether typical coping skills are healthy or unhealthy.  Therapeutic Goals: Patients will share situations that commonly incite their anger and how they typically respond Patients will identify how their coping skills work for them and/or against them Patients will explore possible alternative coping skills Patients will learn that anger itself is normal and that healthier reactions can assist with resolving conflict rather than worsening situations  Summary of Patient Progress:  The patient was admitted during group and was ushered into the room where she sat quietly and nervously, but was assured she could just listen.  Therapeutic Modalities:   Cognitive Behavioral Therapy Processing  Lynnell Chad

## 2021-03-04 NOTE — BH Assessment (Signed)
Pt has been accepted to [facility] and can arrive at the team's earliest convenience.  Room: 103-1  Accepting: Otila Back, PA  Attending: Dr. Sherron Flemings  Call to Report: [phone]  This information was relayed to pt's team at 0250.

## 2021-03-04 NOTE — ED Notes (Signed)
Report given to [facility] at [facility]. Requesting patient arrive at 9:30.

## 2021-03-04 NOTE — Progress Notes (Signed)
°   03/04/21 2301  Psych Admission Type (Psych Patients Only)  Admission Status Involuntary  Psychosocial Assessment  Patient Complaints Crying spells;Depression  Eye Contact Brief  Facial Expression Flat  Affect Appropriate to circumstance  Speech Soft  Interaction Forwards little  Motor Activity Other (Comment) (WDL)  Appearance/Hygiene Disheveled  Behavior Characteristics Appropriate to situation  Mood Depressed  Thought Process  Coherency Circumstantial  Content Blaming others  Delusions None reported or observed  Hallucination None reported or observed  Judgment Limited  Confusion None  Danger to Self  Current suicidal ideation? Denies  Danger to Others  Danger to Others None reported or observed

## 2021-03-04 NOTE — Progress Notes (Signed)
Adult Psychoeducational Group Note  Date:  03/04/2021 Time:  11:24 PM  Group Topic/Focus:  Wrap-Up Group:   The focus of this group is to help patients review their daily goal of treatment and discuss progress on daily workbooks.  Participation Level:  Active  Participation Quality:  Appropriate  Affect:  Appropriate  Cognitive:  Appropriate  Insight: Appropriate  Engagement in Group:  Engaged  Modes of Intervention:  Discussion  Additional Comments:    [name] 03/04/2021, 11:24 PM

## 2021-03-04 NOTE — ED Notes (Signed)
Belongings given to [facility] for transport with patient to [facility]

## 2021-03-04 NOTE — Tx Team (Signed)
Initial Treatment Plan 03/04/2021 3:38 PM [name] [id number]    PATIENT STRESSORS: Loss of housing   Substance abuse     PATIENT STRENGTHS: Average or above average intelligence  Capable of independent living  Communication skills  Motivation for treatment/growth  Supportive family/friends    PATIENT IDENTIFIED PROBLEMS: Suicidal ideation       hopelessness    Increased depression           DISCHARGE CRITERIA:  Adequate post-discharge living arrangements Improved stabilization in mood, thinking, and/or behavior Reduction of life-threatening or endangering symptoms to within safe limits Verbal commitment to aftercare and medication compliance  PRELIMINARY DISCHARGE PLAN: Outpatient therapy Placement in alternative living arrangements  PATIENT/FAMILY INVOLVEMENT: This treatment plan has been presented to and reviewed with the patient, [name], The patient has been given the opportunity to ask questions and make suggestions.  Waymond Cera, RN 03/04/2021, 3:38 PM

## 2021-03-05 LAB — COMPREHENSIVE METABOLIC PANEL
ALT: 15 U/L (ref 0–44)
AST: 18 U/L (ref 15–41)
Albumin: 4 g/dL (ref 3.5–5.0)
Alkaline Phosphatase: 70 U/L (ref 38–126)
Anion gap: 7 (ref 5–15)
BUN: 18 mg/dL (ref 6–20)
CO2: 28 mmol/L (ref 22–32)
Calcium: 9.4 mg/dL (ref 8.9–10.3)
Chloride: 104 mmol/L (ref 98–111)
Creatinine, Ser: 1.08 mg/dL — ABNORMAL HIGH (ref 0.44–1.00)
GFR, Estimated: >60 mL/min (ref >60)
Glucose, Bld: 92 mg/dL (ref 70–99)
Potassium: 3.8 mmol/L (ref 3.5–5.1)
Sodium: 139 mmol/L (ref 135–145)
Total Bilirubin: 0.1 mg/dL — ABNORMAL LOW (ref 0.3–1.2)
Total Protein: 7.4 g/dL (ref 6.5–8.1)

## 2021-03-05 LAB — HEMOGLOBIN A1C
Hgb A1c MFr Bld: 5 % (ref 4.8–5.6)
Mean Plasma Glucose: 96.8 mg/dL

## 2021-03-05 LAB — TSH: TSH: 1.233 u[IU]/mL (ref 0.350–4.500)

## 2021-03-05 LAB — LIPID PANEL
Cholesterol: 223 mg/dL — ABNORMAL HIGH (ref 0–200)
HDL: 60 mg/dL (ref >40)
LDL Cholesterol: 148 mg/dL — ABNORMAL HIGH (ref 0–99)
Total CHOL/HDL Ratio: 3.7 RATIO
Triglycerides: 74 mg/dL (ref <150)
VLDL: 15 mg/dL (ref 0–40)

## 2021-03-05 NOTE — Progress Notes (Signed)
Pt is A&OX4, anxious, tearful ("I miss my son."), and cooperative. Pt received medication for anxiety. Pt request nicotine gun. Writer will continue to monitor.

## 2021-03-05 NOTE — Progress Notes (Signed)
Pt anxious but pleasant on approach, flat affect.  No complaints voiced at this time.  Support and encouragement offered, medications given as ordered.  Will continue to monitor.

## 2021-03-05 NOTE — BHH Group Notes (Signed)
Adult Psychoeducational Group Not Date:  03/05/2021 Time:  8546-2703 Group Topic/Focus: PROGRESSIVE RELAXATION. A group where deep breathing is taught and tensing and relaxation muscle groups is used. Imagery is used as well.  Pts are asked to imagine 3 pillars that hold them up when they are not able to hold themselves up.  Participation Level:  Active  Participation Quality:  Appropriate  Affect:  Appropriate  Cognitive:  Oriented  Insight: Improving  Engagement in Group:  Engaged  Modes of Intervention:  Activity, Discussion, Education, and Support  Additional Comments:  Rates her energy as a 6/10. States her son is her pillars  Dione Housekeeper
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
## 2021-03-05 NOTE — Progress Notes (Signed)
D. Pt presents as anxious, timid- guarded during interactions.  Per pt's self inventory, pt rated her depression, hopelessness and anxiety all 0's today. Pt wrote that her goal was to work on getting better.  Pt observed attending groups today. Pt currently denies SI/HI and AVH A. Labs and vitals monitored.. Pt supported emotionally and encouraged to express concerns and ask questions.   R. Pt remains safe with 15 minute checks. Will continue POC.
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
## 2021-03-05 NOTE — Group Note (Signed)
[facility] LCSW Group Therapy Note  03/05/2021  10:00-11:00am  Type of Therapy and Topic:  Group Therapy:  A Hero Worthy of Support  Participation Level:  Active   Description of Group:  Patients in this group were introduced to the concept that additional supports including self-support are an essential part of recovery.  Matching needs with supports to help fulfill those needs was explained.  Establishing boundaries that can gradually be increased or decreased was described, with patients giving their own examples of establishing appropriate boundaries in their lives.  A song entitled "My Own Hero" was played and a group discussion ensued in which patients stated it inspired them to help themselves in order to succeed, because other people cannot achieve their goals such as sobriety or stability for them.  A song was played called "Love Me More" which led to a discussion about being willing to believe we are worth the effort of being a self-support.   Therapeutic Goals: 1)  demonstrate the importance of being a key part of one's own support system 2)  discuss various available supports 3)  show how peer supports can be a valuable tool in recovery 4)  elicit ideas from patients about supports that need to be added   Summary of Patient Progress:  The patient expressed that something the group could not tell about them on the outside is that she likes helping people and taking care of people with disabilities.  Many other patients expressed having something in common with the patient.  The patient stated current supports include her 8yo son.  The patient expressed a desire and willingness to add sober friends after discharge to help in recovery.  The patient's participation was fair, although she remained attentive when not talking.  Therapeutic Modalities:   Motivational Interviewing Activity  Lynnell Chad

## 2021-03-06 ENCOUNTER — Encounter (HOSPITAL_COMMUNITY): Payer: Self-pay

## 2021-03-06 DIAGNOSIS — F1994 Other psychoactive substance use, unspecified with psychoactive substance-induced mood disorder: Principal | ICD-10-CM

## 2021-03-06 MED ORDER — NICOTINE 21 MG/24HR TD PT24
MEDICATED_PATCH | TRANSDERMAL | Status: AC
  Filled 2021-03-06: qty 1

## 2021-03-06 MED ORDER — NICOTINE 21 MG/24HR TD PT24
21.0000 mg | MEDICATED_PATCH | Freq: Every day | TRANSDERMAL | Status: DC
  Administered 2021-03-06 – 2021-03-08 (×3): 21 mg via TRANSDERMAL
  Filled 2021-03-06 (×3): qty 1

## 2021-03-06 NOTE — Progress Notes (Signed)
Pt denies SI/HI/AVH and verbally agrees to approach staff if these become apparent or before harming themselves/others. Rates depression 0/10. Rates anxiety 0/10. Rates pain 0/10. Scheduled medications administered to pt, per MD orders. RN provided support and encouragement to pt. Q15 min safety checks implemented and continued. Pt safe on the unit. RN will continue to monitor and intervene as needed.   03/06/21 0809  Psych Admission Type (Psych Patients Only)  Admission Status Involuntary  Psychosocial Assessment  Patient Complaints None  Eye Contact Brief  Facial Expression Anxious;Flat;Angry  Affect Anxious  Speech Logical/coherent  Interaction Forwards little;Minimal  Motor Activity Other (Comment) (WDL)  Appearance/Hygiene Disheveled  Behavior Characteristics Cooperative;Appropriate to situation;Anxious  Mood Anxious;Sad;Angry  Thought Process  Coherency Circumstantial  Content Blaming others  Delusions None reported or observed  Perception WDL  Hallucination None reported or observed  Judgment Limited  Confusion None  Danger to Self  Current suicidal ideation? Denies  Danger to Others  Danger to Others None reported or observed

## 2021-03-06 NOTE — Progress Notes (Signed)
°   03/06/21 2312  Psych Admission Type (Psych Patients Only)  Admission Status Involuntary  Psychosocial Assessment  Patient Complaints None  Eye Contact Brief  Facial Expression Anxious  Affect Anxious  Speech Logical/coherent  Interaction Forwards little;Minimal  Motor Activity Other (Comment) (WDL)  Appearance/Hygiene Disheveled  Behavior Characteristics Cooperative;Appropriate to situation  Mood Anxious  Thought Process  Coherency Circumstantial  Content Blaming others  Delusions None reported or observed  Perception WDL  Hallucination None reported or observed  Judgment Limited  Confusion None  Danger to Self  Current suicidal ideation? Denies  Danger to Others  Danger to Others None reported or observed   D: Patient calm and cooperative in her room. Pt forwarded little to writer but denies SI/HI/AVH and pain.  A: Support and encouragement provided as needed.  R: Patient remains safe on the unit. Will continue to monitor for safety and stability.

## 2021-03-06 NOTE — Progress Notes (Signed)
NUTRITION ASSESSMENT  Pt identified as at risk on the Malnutrition Screen Tool  INTERVENTION: 1. Supplements: Ensure Plus High Protein po BID, each supplement provides 350 kcal and 20 grams of protein.   NUTRITION DIAGNOSIS: Unintentional weight loss related to sub-optimal intake as evidenced by pt report.   Goal: Pt to meet >/= 90% of their estimated nutrition needs.  Monitor:  PO intake  Assessment:  Pt admitted with substance abuse (cocaine, alcohol). Pt reports fair-good appetite. Per weight records, pt's lowest weight was 84 lbs on 12/13/20. Weight has increased since to 105 lbs. Pt is underweight based on BMI. Recommend continue Ensure supplements.   Height: Ht Readings from Last 1 Encounters:  03/04/21 5' 6.5" (1.689 m)    Weight: Wt Readings from Last 1 Encounters:  03/04/21 48.1 kg    Weight Hx: Wt Readings from Last 10 Encounters:  03/04/21 48.1 kg  12/13/20 38.6 kg  06/19/19 56.7 kg  04/11/19 50 kg  03/06/18 49 kg  01/25/18 54.3 kg  01/26/17 53.1 kg  02/10/16 53.1 kg  03/26/15 53.1 kg  03/03/13 57.4 kg    BMI:  Body mass index is 16.85 kg/m. Pt meets criteria for underweight based on current BMI.  Estimated Nutritional Needs: Kcal: 25-30 kcal/kg Protein: > 1 gram protein/kg Fluid: 1 ml/kcal  Diet Order:  Diet Order             Diet regular Room service appropriate? Yes; Fluid consistency: Thin  Diet effective now                  Pt is also offered choice of unit snacks mid-morning and mid-afternoon.  Pt is eating as desired.   Lab results and medications reviewed.   Tilda Franco, MS, RD, LDN Inpatient Clinical Dietitian Contact information available via Amion

## 2021-03-06 NOTE — Group Note (Signed)
[facility] LCSW Group Therapy Note   Group Date: 03/06/2021 Start Time: 1300 End Time: 1400   Type of Therapy/Topic:  Group Therapy:  Emotion Regulation  Participation Level:  Active   Mood:  Description of Group:    The purpose of this group is to assist patients in learning to regulate negative emotions and experience positive emotions. Patients will be guided to discuss ways in which they have been vulnerable to their negative emotions. These vulnerabilities will be juxtaposed with experiences of positive emotions or situations, and patients challenged to use positive emotions to combat negative ones. Special emphasis will be placed on coping with negative emotions in conflict situations, and patients will process healthy conflict resolution skills.  Therapeutic Goals: Patient will identify two positive emotions or experiences to reflect on in order to balance out negative emotions:  Patient will label two or more emotions that they find the most difficult to experience:  Patient will be able to demonstrate positive conflict resolution skills through discussion or role plays:   Summary of Patient Progress:   Pt shared during introductions her name and that today they are feeling happy and good. Pt was slept throughout much of group discussion.      Therapeutic Modalities:   Cognitive Behavioral Therapy Feelings Identification Dialectical Behavioral Therapy   Felizardo Hoffmann, LCSWA

## 2021-03-06 NOTE — Progress Notes (Signed)
Psychoeducational Group Note  Date:  03/06/2021 Time:  2113  Group Topic/Focus:  Wrap-Up Group:   The focus of this group is to help patients review their daily goal of treatment and discuss progress on daily workbooks.  Participation Level: Did Not Attend  Participation Quality:  Not Applicable  Affect:  Not Applicable  Cognitive:  Not Applicable  Insight:  Not Applicable  Engagement in Group: Not Applicable  Additional Comments:  The  patient did not attend group this evening.   Archie Balboa S 03/06/2021, 9:13 PM

## 2021-03-06 NOTE — Progress Notes (Signed)
[facility]  03/06/2021 1:30 PM [name]  MRN:  [id number] Subjective:   Principal Problem: Substance induced mood disorder (HCC) Diagnosis: Principal Problem:   Substance induced mood disorder (HCC)  Doris Lennoxatalie Patmon is a 31 year old female with no self-reported or past documented psychiatric history.  She [facility] for reported suicidal statements.  She was involuntarily committed by her mother.  She is currently admitted to the behavioral health hospital on an involuntary basis.  The patient's chart was reviewed. Over the past 24 hrs, there were no documented behavioral issues, no PRN medications given for agitation, and the patient was compliant with scheduled medications. The patient's case was discussed in multidisciplinary team meeting.   On interview and assessment this morning, the patient exhibits a concrete thought process with a limited fund of knowledge. She has an odd affect and appears somewhat anxious.  She reports she went to groups yesterday where they discussed grief.  She reported learning from this that it is best to talk to someone and not hold in emotions.  She reports "being here is helping me, I am better now and following directions".  She continues to deny any previous depression or other psychiatric symptoms. The patient denies auditory/visual hallucinations and first rank symptoms.  She reports good mood, appetite, and sleep. She denies suicidal and homicidal thoughts. She denies side effects from her medications.  Review of systems as below.   Her cognitive function is tested with several different questions from the [facility].  She is unable to complete the follow the trails task.  She is unable to identify a rhinoceros or a camel.  She is unable to perform any serial sevens. She is able to state the similarity between a train and a bicycle, but is unable to do so  with a watch and ruler.  She is only able to remember 2 out of 5 words given.     Total Time Spent in Direct Patient Care: I personally spent 30 minutes on the unit in direct patient care. The direct patient care time included face-to-face time with the patient, reviewing the patient's chart, communicating with other professionals, and coordinating care. Greater than 50% of this time was spent in counseling or coordinating care with the patient regarding goals of hospitalization, psycho-education, and discharge planning needs.  Past Psychiatric Hx: Previous Psych Diagnoses: Patient denies, no record found in the electronic medical record Prev unsuccessful med trials: N/A Current/prior outpatient treatment: N/A   Prior inpatient treatment: Patient denies, no record found in the electronic medical record   Suicide Risk Questions: History of suicide attempts: Denies Hx of self harm: Denies Kids: One 31-year-old child Religious beliefs: Denies FMHx of suicide attempts: Denies Access to lethal means: Denies History of homicide: Denies   Substance Abuse Hx: Alcohol: Reports drinking alcohol 1 time per month Tobacco: Reports smoking 1 pack/day Illicit drugs: Reports using cocaine 3 times a day   Past Medical History: Medical Diagnoses: Reports having 1 seizure in middle school LMP: Currently Contraception: None   Family History: Psych: Denies family history of mental illness and substance use     Social History: 31 year old low SES female with 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 who uses cocaine regularly        Sleep: Fair  Appetite:  Fair  Current Medications: Current Facility-Administered Medications  Medication Dose Route Frequency Provider Last Rate Last Admin   acetaminophen (TYLENOL) tablet 650 mg  650  mg Oral Q6H PRN Nwoko, Uchenna E, PA   650 mg at 03/04/21 1215   alum & mag hydroxide-simeth (MAALOX/MYLANTA) 200-200-20 MG/5ML suspension 30 mL  30 mL Oral Q4H PRN  Nwoko, Uchenna E, PA       feeding supplement (ENSURE ENLIVE / ENSURE PLUS) liquid 237 mL  237 mL Oral BID BM Hill, Shelbie Hutching, MD   237 mL at 03/04/21 1620   hydrOXYzine (ATARAX) tablet 25 mg  25 mg Oral TID PRN Nwoko, Uchenna E, PA   25 mg at 03/05/21 1958   magnesium hydroxide (MILK OF MAGNESIA) suspension 30 mL  30 mL Oral Daily PRN Nwoko, Uchenna E, PA       nicotine (NICODERM CQ - dosed in mg/24 hours) 21 mg/24hr patch            nicotine (NICODERM CQ - dosed in mg/24 hours) patch 21 mg  21 mg Transdermal Daily Hill, Shelbie Hutching, MD   21 mg at 03/06/21 0809   traZODone (DESYREL) tablet 50 mg  50 mg Oral QHS PRN Nwoko, Tommas Olp, PA        Lab Results:  Results for orders placed or performed during the hospital encounter of 03/04/21 (from the past 48 hour(s))  Hemoglobin A1c     Status: None   Collection Time: 03/05/21  6:26 AM  Result Value Ref Range   Hgb A1c MFr Bld 5.0 4.8 - 5.6 %    Comment: (NOTE) Pre diabetes:          5.7%-6.4%  Diabetes:              >6.4%  Glycemic control for   <7.0% adults with diabetes    Mean Plasma Glucose 96.8 mg/dL    Comment: Performed at [facility], [address] [place], [place] [address]  Lipid panel     Status: Abnormal   Collection Time: 03/05/21  6:26 AM  Result Value Ref Range   Cholesterol 223 (H) 0 - 200 mg/dL   Triglycerides 74 <604 mg/dL   HDL 60 >54 mg/dL   Total CHOL/HDL Ratio 3.7 RATIO   VLDL 15 0 - 40 mg/dL   LDL Cholesterol 098 (H) 0 - 99 mg/dL    Comment:        Total Cholesterol/HDL:CHD Risk Coronary Heart Disease Risk Table                     Men   Women  1/2 Average Risk   3.4   3.3  Average Risk       5.0   4.4  2 X Average Risk   9.6   7.1  3 X Average Risk  23.4   11.0        Use the calculated Patient Ratio above and the CHD Risk Table to determine the patient's CHD Risk.        ATP III CLASSIFICATION (LDL):  <100     mg/dL   Optimal  119-147  mg/dL   Near or Above                     Optimal  130-159  mg/dL   Borderline  829-562  mg/dL   High  >130     mg/dL   Very High Performed at [facility], [address], [place], [place] [address]   TSH     Status: None   Collection Time: 03/05/21  6:26 AM  Result Value Ref Range   TSH 1.233 0.350 - 4.500 uIU/mL    Comment: Performed by a 3rd Generation assay with a functional sensitivity of <=0.01 uIU/mL. Performed at [facility], [address], [place], [place] [address]   Comprehensive metabolic panel     Status: Abnormal   Collection Time: 03/05/21  6:26 PM  Result Value Ref Range   Sodium 139 135 - 145 mmol/L   Potassium 3.8 3.5 - 5.1 mmol/L   Chloride 104 98 - 111 mmol/L   CO2 28 22 - 32 mmol/L   Glucose, Bld 92 70 - 99 mg/dL    Comment: Glucose reference range applies only to samples taken after fasting for at least 8 hours.   BUN 18 6 - 20 mg/dL   Creatinine, Ser 7.42 (H) 0.44 - 1.00 mg/dL   Calcium 9.4 8.9 - 59.5 mg/dL   Total Protein 7.4 6.5 - 8.1 g/dL   Albumin 4.0 3.5 - 5.0 g/dL   AST 18 15 - 41 U/L   ALT 15 0 - 44 U/L   Alkaline Phosphatase 70 38 - 126 U/L   Total Bilirubin 0.1 (L) 0.3 - 1.2 mg/dL   GFR, Estimated >63 >87 mL/min    Comment: (NOTE) Calculated using the CKD-EPI Creatinine Equation (2021)    Anion gap 7 5 - 15    Comment: Performed at [facility], [address] [place], [place] [address]    Blood Alcohol level:  Lab Results  Component Value Date   ETH <10 03/02/2021    Metabolic Disorder Labs: Lab Results  Component Value Date   HGBA1C 5.0 03/05/2021   MPG 96.8 03/05/2021   MPG 126 (H) 03/01/2013   No results found for: PROLACTIN Lab Results  Component Value Date   CHOL 223 (H) 03/05/2021   TRIG 74 03/05/2021   HDL 60 03/05/2021   CHOLHDL 3.7 03/05/2021   VLDL 15 03/05/2021   LDLCALC 148 (H) 03/05/2021   LDLCALC 138 (H) 03/02/2013    Psychiatric Specialty Exam: Physical Exam Constitutional:       Appearance: She is not toxic-appearing.  Pulmonary:     Effort: Pulmonary effort is normal.  Neurological:     General: No focal deficit present.     Mental Status: She is alert and oriented to person, place, and time.     Review of Systems  Respiratory:  Negative for shortness of breath.   Cardiovascular:  Negative for chest pain.  Gastrointestinal:  Negative for constipation, diarrhea, nausea and vomiting.  Genitourinary:  Negative for dysuria.  Neurological:  Negative for dizziness and headaches.   BP 108/65 (BP Location: Left Arm)    Pulse 86    Temp 97.7 F (36.5 C) (Oral)    Resp 18    Ht 5' 6.5" (1.689 m)    Wt 48.1 kg    SpO2 100%    BMI 16.85 kg/m    General Appearance: casually dressed middle age female  Eye Contact:  Good  Speech:  incoherent at times  Volume:  Normal  Mood:  "good"  Affect:  odd, anxious appearing, less tearful than yesterday  Thought Process:  Disorganized  Orientation:  Full (Time, Place, and Person)  Thought Content:  Patient denied SI/HI/AVH, delusions, paranoia, first rank symptoms. Patient is not grossly responding to internal/external stimuli on exam and did not make delusional statements.    Suicidal Thoughts:  No  Homicidal Thoughts:  No  Memory:  poor  Judgement:  Poor  Insight:  Lacking  Psychomotor Activity:  normal  Concentration:  poor  Recall:  poor  Fund of Knowledge:  poor  Language:  poor        AIMS (if indicated):   NA  Assets:  Social Support  ADL's:  Intact     Sleep:   fair     Treatment Plan Summary: Daily contact with patient to assess and evaluate symptoms and progress in treatment and Medication management  Safety and Monitoring -- INVOLUNTARY admission to inpatient psychiatric unit for safety, stabilization and treatment -- Daily contact with patient to assess and evaluate symptoms and progress in treatment -- Patient's case to be discussed in multi-disciplinary team meeting -- Observation Level : q15  minute checks -- Vital signs:  q12 hours -- Precautions: suicide   Diagnoses: Possible substance induced mood disorder, R/O MDD/adjustment disorder 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 Cocaine use disorder Tobacco use disorder   Possible substance induced mood disorder, R/O MDD/adjustment disorder -Patient has declined medication, may not be able to recognize her own depression -Supportive psychotherapy -Work on insight -Collect collateral 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

## 2021-03-06 NOTE — BH IP Treatment Plan (Signed)
Interdisciplinary Treatment and Diagnostic Plan Update  03/06/2021 Time of Session: 9:10am JERITA WIMBUSH MRN: [id number]  Principal Diagnosis: Substance induced mood disorder ([place])  Secondary Diagnoses: Principal Problem:   Substance induced mood disorder ([place])   Current Medications:  Current Facility-Administered Medications  Medication Dose Route Frequency Provider Last Rate Last Admin   acetaminophen (TYLENOL) tablet 650 mg  650 mg Oral Q6H PRN Nwoko, Uchenna E, PA   650 mg at 03/04/21 1215   alum & mag hydroxide-simeth (MAALOX/MYLANTA) 200-200-20 MG/5ML suspension 30 mL  30 mL Oral Q4H PRN Nwoko, Uchenna E, PA       feeding supplement (ENSURE ENLIVE / ENSURE PLUS) liquid 237 mL  237 mL Oral BID BM Hill, Jackie Plum, MD   237 mL at 03/04/21 1620   hydrOXYzine (ATARAX) tablet 25 mg  25 mg Oral TID PRN Nwoko, Uchenna E, PA   25 mg at 03/05/21 1958   magnesium hydroxide (MILK OF MAGNESIA) suspension 30 mL  30 mL Oral Daily PRN Nwoko, Uchenna E, PA       nicotine (NICODERM CQ - dosed in mg/24 hours) 21 mg/24hr patch            nicotine (NICODERM CQ - dosed in mg/24 hours) patch 21 mg  21 mg Transdermal Daily Hill, Jackie Plum, MD   21 mg at 03/06/21 0809   traZODone (DESYREL) tablet 50 mg  50 mg Oral QHS PRN Nwoko, Uchenna E, PA       PTA Medications: Medications Prior to Admission  Medication Sig Dispense Refill Last Dose   ondansetron (ZOFRAN) 4 MG tablet Take 1 tablet (4 mg total) by mouth every 8 (eight) hours as needed for nausea or vomiting. 20 tablet 0     Patient Stressors: Loss of housing   Substance abuse    Patient Strengths: Average or above average intelligence  Capable of independent living  Communication skills  Motivation for treatment/growth  Supportive family/friends   Treatment Modalities: Medication Management, Group therapy, Case management,  1 to 1 session with clinician, Psychoeducation, Recreational therapy.   Physician Treatment Plan  for Primary Diagnosis: Substance induced mood disorder ([place]) Long Term Goal(s): Improvement in symptoms so as ready for discharge   Short Term Goals: Ability to identify changes in lifestyle to reduce recurrence of condition will improve Ability to verbalize feelings will improve Ability to disclose and discuss suicidal ideas  Medication Management: Evaluate patient's response, side effects, and tolerance of medication regimen.  Therapeutic Interventions: 1 to 1 sessions, Unit Group sessions and Medication administration.  Evaluation of Outcomes: Not Met  Physician Treatment Plan for Secondary Diagnosis: Principal Problem:   Substance induced mood disorder ([place])  Long Term Goal(s): Improvement in symptoms so as ready for discharge   Short Term Goals: Ability to identify changes in lifestyle to reduce recurrence of condition will improve Ability to verbalize feelings will improve Ability to disclose and discuss suicidal ideas     Medication Management: Evaluate patient's response, side effects, and tolerance of medication regimen.  Therapeutic Interventions: 1 to 1 sessions, Unit Group sessions and Medication administration.  Evaluation of Outcomes: Not Met   RN Treatment Plan for Primary Diagnosis: Substance induced mood disorder ([place]) Long Term Goal(s): Knowledge of disease and therapeutic regimen to maintain health will improve  Short Term Goals: Ability to remain free from injury will improve, Ability to verbalize frustration and anger appropriately will improve, Ability to demonstrate self-control, Ability to participate in decision making will improve, Ability to identify  and develop effective coping behaviors will improve, and Compliance with prescribed medications will improve  Medication Management: RN will administer medications as ordered by provider, will assess and evaluate patient's response and provide education to patient for prescribed medication. RN will report any  adverse and/or side effects to prescribing provider.  Therapeutic Interventions: 1 on 1 counseling sessions, Psychoeducation, Medication administration, Evaluate responses to treatment, Monitor vital signs and CBGs as ordered, Perform/monitor CIWA, COWS, AIMS and Fall Risk screenings as ordered, Perform wound care treatments as ordered.  Evaluation of Outcomes: Not Met   LCSW Treatment Plan for Primary Diagnosis: Substance induced mood disorder ([place]) Long Term Goal(s): Safe transition to appropriate next level of care at discharge, Engage patient in therapeutic group addressing interpersonal concerns.  Short Term Goals: Engage patient in aftercare planning with referrals and resources, Increase social support, Increase ability to appropriately verbalize feelings, Increase emotional regulation, Identify triggers associated with mental health/substance abuse issues, and Increase skills for wellness and recovery  Therapeutic Interventions: Assess for all discharge needs, 1 to 1 time with Social worker, Explore available resources and support systems, Assess for adequacy in community support network, Educate family and significant other(s) on suicide prevention, Complete Psychosocial Assessment, Interpersonal group therapy.  Evaluation of Outcomes: Not Met   Progress in Treatment: Attending groups: Yes. Participating in groups: Yes. Taking medication as prescribed: Yes. Toleration medication: Yes. Family/Significant other contact made: No, will contact:  boyfriend Patient understands diagnosis: Yes. and No. Discussing patient identified problems/goals with staff: Yes. Medical problems stabilized or resolved: Yes. Denies suicidal/homicidal ideation: Yes. Issues/concerns per patient self-inventory: No.   New problem(s) identified: No, Describe:  none  New Short Term/Long Term Goal(s): detox, medication management for mood stabilization; elimination of SI thoughts; development of  comprehensive mental wellness/sobriety plan  Patient Goals:  "To get better and do better"  Discharge Plan or Barriers: Patient recently admitted. CSW will continue to follow and assess for appropriate referrals and possible discharge planning.    Reason for Continuation of Hospitalization: Anxiety Depression Homicidal ideation Medication stabilization Suicidal ideation  Estimated Length of Stay: 3-5 days   Scribe for Treatment Team: Vassie Moselle, LCSW 03/06/2021 10:22 AM

## 2021-03-06 NOTE — BHH Counselor (Signed)
CPS came and met with patient.  Patient expressed wanting either outpatient SAIOP or residential treatment. Patient discussed plan for discharge if not to residential treatment to go to a hotel.  Patient consented to provide discharge paperwork to [place] social worker. Contact information below:  Ms. Celedonio Miyamoto (Ph): [phone] Email: [email or web address].     Tashi Band, LCSW, [place] Social Worker  [facility]

## 2021-03-06 NOTE — BHH Group Notes (Signed)
Spiritual care group on grief and loss facilitated by chaplain Dyanne Carrel, [facility]   Group Goal:   Support / Education around grief and loss   Members engage in facilitated group support and psycho-social education.   Group Description:   Following introductions and group rules, group members engaged in facilitated group dialog and support around topic of loss, with particular support around experiences of loss in their lives. Group Identified types of loss (relationships / self / things) and identified patterns, circumstances, and changes that precipitate losses. Reflected on thoughts / feelings around loss, normalized grief responses, and recognized variety in grief experience. Group noted Worden's four tasks of grief in discussion.   Group drew on Adlerian / Rogerian, narrative, MI,   Patient Progress: [name] attended group.  She appeared somewhat withdrawn and wrapped herself in her blanket.  She did participate in conversation at times and her comments were on topic and insightful.  [address], [facility] Pager, [phone]

## 2021-03-06 NOTE — Group Note (Signed)
Recreation Therapy Group Note   Group Topic:Stress Management  Group Date: 03/06/2021 Start Time: 0945 End Time: 1000 Facilitators: Caroll Rancher, [place] Location: [address] Dayroom   Goal Area(s) Addresses:  Patient will identify positive stress management techniques. Patient will identify benefits of using stress management post d/c.  Group Description:  Meditation.  LRT played a meditation that focused on using positive affirmations.  Patients were to listen, follow and repeat the affirmations to themselves as the meditation played.  At completion of meditation, LRT discussed with patients were they could find other stress management techniques like Youtube and Apps.    Affect/Mood: Flat   Participation Level: Minimal   Participation Quality: Independent   Behavior: Distracted and Preoccupied   Speech/Thought Process: Distracted   Insight: Lacking   Judgement: Lacking    Modes of Intervention: Meditation   Patient Response to Interventions:  Disengaged   Education Outcome:  Acknowledges education and In group clarification offered    Clinical Observations/Individualized Feedback: Pt was a distraction by digging through her cereal and chewing it loud enough to be heard during group session.    Plan: Continue to engage patient in RT group sessions 2-3x/week.   Caroll Rancher, LRT,CTRS 03/06/2021 1:05 PM

## 2021-03-06 NOTE — Progress Notes (Signed)
Talk with the patient's mother at the number listed in the chart.  She reports that the patient's child is currently located with the patient's sister.  She reports that the patient has been depressed for some time.  She feels that the patient's drug use is driving this depression.  She denies any psychotic symptoms or previous manic episodes.  She reports the patient does get SSI for "a learning disability".  She reports that the patient spends this check every month on drugs and then has difficulty obtaining food.  She reports that the patient cannot he comprehend even relatively simple concepts.  She states that the patient will pretend to follow along but will not be sure what is actually happening in the conversation.  Corky Sox, MD PGY-1

## 2021-03-07 MED ORDER — LOPERAMIDE HCL 2 MG PO CAPS: 2.0000 mg | ORAL_CAPSULE | ORAL | Status: DC | PRN

## 2021-03-07 NOTE — Plan of Care (Signed)
Nurse discussed coping skills with patient.  

## 2021-03-07 NOTE — BHH Counselor (Signed)
CSW followed up with L. Tennis Ship, CPS worker after CSW missed her is the lobby. Ms. Tennis Ship states that Ms. Frechette's case has been reassigned to L. Montez Morita. CSW reached out to this individual via email to inquire contact number.   Fredirick Lathe, LCSWA Clinicial Social Worker [employer]

## 2021-03-07 NOTE — Progress Notes (Signed)
D:  Patient denied SI and HI, contracts for safety.  Denied A/V hallucinations.  Denied pain. A:  Medications administered per MD orders.  Emotional support and encouragement given patient. R:  Safety maintained with 15 minute checks.  

## 2021-03-07 NOTE — Group Note (Signed)
Date:  03/07/2021 Time:  10:11 AM  Group Topic/Focus:  Orientation:   The focus of this group is to educate the patient on the purpose and policies of crisis stabilization and provide a format to answer questions about their admission.  The group details unit policies and expectations of patients while admitted.    Participation Level:  Active  Participation Quality:  Appropriate  Affect:  Appropriate  Cognitive:  Lacking  Insight: Limited  Engagement in Group:  Distracting  Modes of Intervention:  Discussion  Additional Comments:    Reymundo Poll 03/07/2021, 10:11 AM

## 2021-03-07 NOTE — Progress Notes (Addendum)
[facility] MD Progress Note  03/07/2021 4:01 PM [name]  MRN:  [id number] Subjective:   Principal Problem: Substance induced mood disorder ([place]) Diagnosis: Principal Problem:   Substance induced mood disorder ([place])  [name] is a 31 year old female with no self-reported or past documented psychiatric history.  She presented involuntarily to the [facility] emergency department via the [facility] Department for reported suicidal statements.  She was involuntarily committed by her mother.  She is currently admitted to the behavioral health hospital on an involuntary basis.  The patient's chart was reviewed. Over the past 24 hrs, there were no documented behavioral issues, no PRN medications given for agitation, and the patient was compliant with scheduled medications. The patient's case was discussed in multidisciplinary team meeting.   On interview and assessment this morning, the patient exhibits a concrete thought process with a limited fund of knowledge. She has an odd affect and appears somewhat anxious.  She is able to relate what brought her into the hospital, namely that her mother had her involuntarily committed for making suicidal statements.  She reports that her disposition plan is to go and stay with her grandmother.  She reports she is not interested in rehab, but is willing to go to an [place] program.  She denies suicidal thoughts and states that she has only recently been made upset by not being able to talk to her son.  She declines medication for depression on this basis.  While speaking with the patient, the patient was served papers stating that her mother is attempting to have a restraining order served on the patient but the request has been denied. She was made aware that there is a court date on the 16th of this month for her to present her case before a judge.  The patient wishes to go to the court hearing. The patient denies auditory/visual hallucinations and  first rank symptoms.  She reports good mood, appetite, and sleep. She denies suicidal and homicidal thoughts. She denies side effects from her medications.  Review of systems as below.   Total Time Spent in Direct Patient Care: I personally spent 30 minutes on the unit in direct patient care. The direct patient care time included face-to-face time with the patient, reviewing the patient's chart, communicating with other professionals, and coordinating care. Greater than 50% of this time was spent in counseling or coordinating care with the patient regarding goals of hospitalization, psycho-education, and discharge planning needs.  Past Psychiatric Hx: Previous Psych Diagnoses: Patient denies, no record found in the electronic medical record Prev unsuccessful med trials: N/A Current/prior outpatient treatment: N/A   Prior inpatient treatment: Patient denies, no record found in the electronic medical record   Suicide Risk Questions: History of suicide attempts: Denies Hx of self harm: Denies Kids: One 72-year-old child Religious beliefs: Denies FMHx of suicide attempts: Denies Access to lethal means: Denies History of homicide: Denies   Substance Abuse Hx: Alcohol: Reports drinking alcohol 1 time per month Tobacco: Reports smoking 1 pack/day Illicit drugs: Reports using cocaine 3 times a day   Past Medical History: Medical Diagnoses: Reports having 1 seizure in middle school LMP: Currently Contraception: None   Family History: Psych: Denies family history of mental illness and substance use     Social History: 31 year old low SES female with 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 who uses cocaine regularly        Sleep: Fair  Appetite:  Fair  Current Medications: Current Facility-Administered Medications  Medication Dose Route  Frequency Provider Last Rate Last Admin   acetaminophen (TYLENOL) tablet 650 mg  650 mg Oral Q6H PRN Nwoko, Uchenna E, PA   650 mg at 03/04/21 1215    alum & mag hydroxide-simeth (MAALOX/MYLANTA) 200-200-20 MG/5ML suspension 30 mL  30 mL Oral Q4H PRN Nwoko, Uchenna E, PA       feeding supplement (ENSURE ENLIVE / ENSURE PLUS) liquid 237 mL  237 mL Oral BID BM Hill, Jackie Plum, MD   237 mL at 03/07/21 1100   hydrOXYzine (ATARAX) tablet 25 mg  25 mg Oral TID PRN Nwoko, Uchenna E, PA   25 mg at 03/05/21 1958   magnesium hydroxide (MILK OF MAGNESIA) suspension 30 mL  30 mL Oral Daily PRN Nwoko, Uchenna E, PA       nicotine (NICODERM CQ - dosed in mg/24 hours) patch 21 mg  21 mg Transdermal Daily Hill, Jackie Plum, MD   21 mg at 03/07/21 0900   traZODone (DESYREL) tablet 50 mg  50 mg Oral QHS PRN Nwoko, Terese Door, PA        Lab Results:  Results for orders placed or performed during the hospital encounter of 03/04/21 (from the past 48 hour(s))  Comprehensive metabolic panel     Status: Abnormal   Collection Time: 03/05/21  6:26 PM  Result Value Ref Range   Sodium 139 135 - 145 mmol/L   Potassium 3.8 3.5 - 5.1 mmol/L   Chloride 104 98 - 111 mmol/L   CO2 28 22 - 32 mmol/L   Glucose, Bld 92 70 - 99 mg/dL    Comment: Glucose reference range applies only to samples taken after fasting for at least 8 hours.   BUN 18 6 - 20 mg/dL   Creatinine, Ser 1.08 (H) 0.44 - 1.00 mg/dL   Calcium 9.4 8.9 - 10.3 mg/dL   Total Protein 7.4 6.5 - 8.1 g/dL   Albumin 4.0 3.5 - 5.0 g/dL   AST 18 15 - 41 U/L   ALT 15 0 - 44 U/L   Alkaline Phosphatase 70 38 - 126 U/L   Total Bilirubin 0.1 (L) 0.3 - 1.2 mg/dL   GFR, Estimated >60 >60 mL/min    Comment: (NOTE) Calculated using the CKD-EPI Creatinine Equation (2021)    Anion gap 7 5 - 15    Comment: Performed at [facility] [address] [place], [place] [address]    Blood Alcohol level:  Lab Results  Component Value Date   ETH <10 [id number]    Metabolic Disorder Labs: Lab Results  Component Value Date   HGBA1C 5.0 03/05/2021   MPG 96.8 03/05/2021   MPG 126 (H) 03/01/2013    No results found for: PROLACTIN Lab Results  Component Value Date   CHOL 223 (H) 03/05/2021   TRIG 74 03/05/2021   HDL 60 03/05/2021   CHOLHDL 3.7 03/05/2021   VLDL 15 03/05/2021   LDLCALC 148 (H) 03/05/2021   LDLCALC 138 (H) 03/02/2013    Psychiatric Specialty Exam: Physical Exam Constitutional:      Appearance: She is not toxic-appearing.  Pulmonary:     Effort: Pulmonary effort is normal.  Neurological:     General: No focal deficit present.     Mental Status: She is alert and oriented to person, place, and time.     Review of Systems  Respiratory:  Negative for shortness of breath.   Cardiovascular:  Negative for chest pain.  Gastrointestinal:  Negative for constipation, diarrhea, nausea and  vomiting.  Genitourinary:  Negative for dysuria.  Neurological:  Negative for dizziness and headaches.   BP 118/70 (BP Location: Left Arm)    Pulse 91    Temp 97.7 F (36.5 C) (Oral)    Resp 18    Ht 5' 6.5" (1.689 m)    Wt 48.1 kg    SpO2 100%    BMI 16.85 kg/m    General Appearance: casually dressed middle age female  Eye Contact:  Good  Speech:  mumbling at times but coherent  Volume:  Normal  Mood:  "good"  Affect:  anxious appearing, gets upset when discussing not being able to talk with her son  Thought Process:  concrete but linear  Orientation:  Full (Time, Place, and Person)  Thought Content:  Patient denied SI/HI/AVH, delusions, paranoia, first rank symptoms. Patient is not grossly responding to internal/external stimuli on exam and did not make delusional statements.    Suicidal Thoughts:  No  Homicidal Thoughts:  No  Memory:  Fair  Judgement:  Poor  Insight:  Lacking  Psychomotor Activity:  normal  Concentration:  Fair  Recall: [employer] of Knowledge:  Fair  Language:  Fair  AIMS (if indicated):   NA  Assets:  Social Support  ADL's:  Intact  Sleep:   total time unrecorded    Treatment Plan Summary: Daily contact with patient to assess and evaluate  symptoms and progress in treatment and Medication management  Safety and Monitoring -- INVOLUNTARY admission to inpatient psychiatric unit for safety, stabilization and treatment -- Daily contact with patient to assess and evaluate symptoms and progress in treatment -- Patient's case to be discussed in multi-disciplinary team meeting -- Observation Level : q15 minute checks -- Vital signs:  q12 hours -- Precautions: suicide   Diagnoses: Possible substance induced mood disorder, R/O MDD/adjustment disorder 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 Cocaine use disorder Tobacco use disorder   Substance induced mood disorder ( R/O MDD vs adjustment disorder) -Patient has declined medication and does not meet forced medication over objection protocol -Supportive psychotherapy provided and she agrees to outpatient therapy after discharge   Stimulant use disorder - cocaine type Cannabis use - r/o cannabis use d/o Tobacco use disorder -She declines residential rehab but agrees to referral for SAIOP or outpatient SA treatment -Encouraged abstinence    Medical Management Covid negative CMP: K of 3.4, Cr of 1.3              -Recheck unremarkable CBC: unremarkable EtOH: <10 UDS: cocaine and THC Upreg/B-HCG: negative TSH: normal A1C: normal, BMI of 16.8 Lipids: chol of 223, LDL 148 EKG: NSR, Qtc of 415  Dispo: return to grandmother's home with SAIOP, hopeful discharge 2/15  Corky Sox, MD PGY-1

## 2021-03-07 NOTE — [facility] Suicide Risk Assessment (Addendum)
[facility] INPATIENT:  Family/Significant Other Suicide Prevention Education  Suicide Prevention Education:  Contact Attempts: boyfriend Sherrod Hickinson [phone], (name of family member/significant other) has been identified by the patient as the family member/significant other with whom the patient will be residing, and identified as the person(s) who will aid the patient in the event of a mental health crisis.  With written consent from the patient, two attempts were made to provide suicide prevention education, prior to and/or following the patient's discharge.  We were unsuccessful in providing suicide prevention education.  A suicide education pamphlet was given to the patient to share with family/significant other.  Date and time of first attempt: 03/07/2021  / 2:44pm  CSW left a HIPAA compliant message.  Date and time of second attempt: 03/08/2021  / 9:45am  CSW left a HIPAA compliant message.   [name] 03/07/2021, 2:45 PM

## 2021-03-07 NOTE — [facility] Group Notes (Signed)
Adult Psychoeducational Group Note  Date:  03/07/2021 Time:  8:47 PM  Group Topic/Focus:  Wrap-Up Group:   The focus of this group is to help patients review their daily goal of treatment and discuss progress on daily workbooks.  Participation Level:  Active  Participation Quality:  Attentive  Affect:  Appropriate  Cognitive:  Alert  Insight: Appropriate  Engagement in Group:  Engaged  Modes of Intervention:  Discussion  Additional Comments:  Patient attended and participated in the Wrap-up group.  Jearl Klinefelter 03/07/2021, 8:47 PM

## 2021-03-08 MED ORDER — NICOTINE 21 MG/24HR TD PT24: 21.0000 mg | MEDICATED_PATCH | Freq: Every day | TRANSDERMAL | 0 refills | Status: DC
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
## 2021-03-08 NOTE — Progress Notes (Signed)
°  [facility] Adult Case Management Discharge Plan :  Will you be returning to the same living situation after discharge:  No. At discharge, do you have transportation home?: Yes,  boyfriend Do you have the ability to pay for your medications: Yes,  Medicaid  Release of information consent forms completed and sent to medical records;  Patient's signature obtained.   Patient to Follow up at:  Follow-up Information     [facility] Go on 03/14/2021.   Why: You have a hospital follow up appointment for therapy and medication management services on 03/14/21 at 2:00 pm.  This appointment will be held in person. Contact information: [address] [place] [address] [phone]         [place] Go to.   Specialty: Professional Counselor Why: Please go to this provider for individual therapy and substance abuse group therapy services, during walk in hours for new patients:  Monday through Friday, from 9:00 am to 1:00 pm. Contact information: [address] [place] [address] [phone]                 Next level of care provider has access to [facility] Link:no  Safety Planning and Suicide Prevention discussed: Yes,  w/ pt     Has patient been referred to the Quitline?: Patient refused referral  Patient has been referred for addiction treatment: Yes  Felizardo Hoffmann, LCSWA 03/08/2021, 10:05 AM

## 2021-03-08 NOTE — [facility] Suicide Risk Assessment (Signed)
[facility] Discharge Suicide Risk Assessment   Principal Problem: Substance induced mood disorder [facility]) Discharge Diagnoses: Principal Problem:   Substance induced mood disorder (HCC)  Total Time Spent in Direct Patient Care:  I personally spent 35 minutes on the unit in direct patient care. The direct patient care time included face-to-face time with the patient, reviewing the patient's chart, communicating with other professionals, and coordinating care. Greater than 50% of this time was spent in counseling or coordinating care with the patient regarding goals of hospitalization, psycho-education, and discharge planning needs.  Subjective: Patient was seen on rounds with Automotive engineer.  She denies SI, HI, AVH, paranoia or delusions.  She continues to decline start of psychotropic medications but is willing for outpatient substance abuse treatment and outpatient psychotherapy after discharge.  She reports stable sleep and appetite and voices no physical complaints today.  She is forward thinking and has plans to stay with her grandmother after discharge and to work with CPS after discharge.  She was made aware that her creatinine was slightly elevated and was encouraged to fluid hydrate and have this rechecked by primary care provider without fail after discharge.  She was made aware that she has an elevated cholesterol and that she should engage in a healthy diet and increase exercise and have this rechecked by primary care provider after discharge without fail.  Time was given for questions.  She was encouraged to abstain from alcohol and illicit drug use after discharge.  Musculoskeletal: Strength & Muscle Tone: within normal limits Gait & Station: normal Patient leans: N/A Psychiatric Specialty Exam: Physical Exam Vitals reviewed.  HENT:     Head: Normocephalic.  Pulmonary:     Effort: Pulmonary effort is normal.  Neurological:     General: No focal deficit present.     Mental Status: She is  alert.    Review of Systems  Respiratory:  Negative for shortness of breath.   Cardiovascular:  Negative for chest pain.  Gastrointestinal:  Negative for diarrhea, nausea and vomiting.  Neurological:  Negative for headaches.   Blood pressure 113/67, pulse 88, temperature 97.7 F (36.5 C), resp. rate 18, height 5' 6.5" (1.689 m), weight 48.1 kg, SpO2 100 %.Body mass index is 16.85 kg/m.  General Appearance:  casually dressed, adequate hygiene  Eye Contact:  Good  Speech:  Clear and Coherent and Normal Rate  Volume:  Normal  Mood:   described as "great" - appears euthymic  Affect:   calm, polite, moderate, stable  Thought Process:  Linear but concrete  Orientation:  Full (Time, Place, and Person)  Thought Content:  Logical - denies AVH, paranoia or delusions  Suicidal Thoughts:  No  Homicidal Thoughts:  No  Memory:  Recent;   Fair  Judgement:  Fair  Insight:  Shallow  Psychomotor Activity:  Normal  Concentration:  Concentration: Fair and Attention Span: Fair  Recall:  [employer] of Knowledge:  Fair  Language:  Good  Akathisia:  Negative  Assets:  Communication Skills Housing Resilience Social Support  ADL's:  Intact    Mental Status Per Nursing Assessment::   On Admission:  Suicidal ideation indicated by others, - resolved  Demographic Factors:  Low socioeconomic status and Unemployed,young adult  Loss Factors: Financial problems/change in socioeconomic status and CPS involvement  Historical Factors: Substance use prior to admission  Risk Reduction Factors:   Responsible for children under 21 years of age, Sense of responsibility to family, Positive social support, and Positive coping skills or  problem solving skills, plans to live with relative after discharge  Continued Clinical Symptoms:  Alcohol/Substance Abuse/Dependencies More than one psychiatric diagnosis  Cognitive Features That Contribute To Risk:  Thought constriction (tunnel vision)    Suicide  Risk:  Mild:  There are no identifiable plans, no associated intent, mild dysphoria and related symptoms, few other risk factors, and identifiable protective factors, including available and accessible social support.   Follow-up Information     [facility] Go on 03/14/2021.   Why: You have a hospital follow up appointment for therapy and medication management services on 03/14/21 at 2:00 pm.  This appointment will be held in person. Contact information: [address] [place] [address] [phone]         [facility] Follow up.   Specialty: Addiction Medicine Why: referral made Contact information: [address] [place] [address] [phone]         [facility] Follow up.   Why: referral made Contact information: [address] [place] [address] [phone]         [facility] Go to.   Specialty: Professional Counselor Why: Please go to this provider for individual therapy and substance abuse group therapy services, during walk in hours for new patients:  Monday through Friday, from 9:00 am to 1:00 pm. Contact information: [address] [place] [address] [phone]                 Plan Of Care/Follow-up recommendations:  Activity:  as tolerated Diet:  heart healthy Other:  Patient advised to abstain from alcohol and illicit drug use after discharge and to start outpatient substance abuse treat and psychotherapy after discharge. She was encouraged to continue fluid hydrating and to have her primary care provider recheck her mildly elevated creatinine without fail after discharge. She was made aware that she has an elevated cholesterol and was encouraged to eat healthier and exercise and to have a primary care provider recheck her lipids without fail after discharge.   Comer Locket, MD, FAPA 03/08/2021, 6:53 AM

## 2021-03-08 NOTE — Plan of Care (Addendum)
D:  Patient's self inventory sheet, patient sleeps good, sleep medication helpful.  Good appetite, normal energy level, good concentration.  Rated depression 6, hopeless and anxiety 5.  Denied withdrawals.  Denied SI.  Denied physical problems.  Denied physical pain.  Worst pain #3 in past 24 hours.  Goal is "doing better".  Plans to "stay away from drugs".  "They had me so good with going to group."  Does have discharge plans. A:  Medications administered per MD orders.  Emotional support and encouragement given patient. R:  Denied SI and HI, contracts for safety.  Denied A/V hallucinations.  Safety maintained with 15 minute checks.

## 2021-03-08 NOTE — Plan of Care (Signed)
Nurse discussed coping skills with patient.  

## 2021-03-08 NOTE — Group Note (Signed)
Date:  03/08/2021 Time:  9:43 AM  Group Topic/Focus:  Orientation:   The focus of this group is to educate the patient on the purpose and policies of crisis stabilization and provide a format to answer questions about their admission.  The group details unit policies and expectations of patients while admitted.    Participation Level:  Minimal  Participation Quality:  Appropriate  Affect:  Appropriate  Cognitive:  Appropriate  Insight: Appropriate  Engagement in Group:  Engaged  Modes of Intervention:  Discussion  Additional Comments:    Jaquita Rector 03/08/2021, 9:43 AM

## 2021-03-08 NOTE — Progress Notes (Signed)
Discharge Note:  Patient discharged home with boyfriend.  Suicide prevention information given and discussed with patient who stated she understood and had no questions.  Patient denied SI and HI.  Denied A/V hallucinations.  Patient stated she received all her belongings, clothing, toiletries, misc items, etc.  Patient stated she appreciated all assistance received from [facility] staff.  All required discharge information given.

## 2021-03-08 NOTE — Group Note (Signed)
Date:  03/08/2021 Time:  11:11 AM  Group Topic/Focus:  Personal Choices and Values:   The focus of this group is to help patients assess and explore the importance of values in their lives, how their values affect their decisions, how they express their values and what opposes their expression.    Participation Level:  Active  Participation Quality:  Appropriate  Affect:  Appropriate  Cognitive:  Appropriate  Insight: Appropriate  Engagement in Group:  Engaged  Modes of Intervention:  Discussion  Additional Comments:  pt was engaged in group  Jaquita Rector 03/08/2021, 11:11 AM

## 2021-03-21 ENCOUNTER — Other Ambulatory Visit: Payer: Self-pay

## 2021-03-21 ENCOUNTER — Ambulatory Visit (HOSPITAL_COMMUNITY)
Admission: EM | Admit: 2021-03-21 | Discharge: 2021-03-21 | Disposition: A | Discharge status: Home / Self Care | Payer: Medicaid Other | Attending: Emergency Medicine | Admitting: Emergency Medicine

## 2021-03-21 ENCOUNTER — Encounter (HOSPITAL_COMMUNITY): Payer: Self-pay

## 2021-03-21 DIAGNOSIS — K0889 Other specified disorders of teeth and supporting structures: Secondary | ICD-10-CM | POA: Diagnosis not present

## 2021-03-21 DIAGNOSIS — K029 Dental caries, unspecified: Secondary | ICD-10-CM

## 2021-03-21 MED ORDER — KETOROLAC TROMETHAMINE 30 MG/ML IJ SOLN
INTRAMUSCULAR | Status: AC
  Filled 2021-03-21: qty 1

## 2021-03-21 MED ORDER — AMOXICILLIN-POT CLAVULANATE 875-125 MG PO TABS: 1.0000 | ORAL_TABLET | Freq: Two times a day (BID) | ORAL | 0 refills | Status: DC

## 2021-03-21 MED ORDER — KETOROLAC TROMETHAMINE 30 MG/ML IJ SOLN
30.0000 mg | Freq: Once | INTRAMUSCULAR | Status: AC
  Administered 2021-03-21: 30 mg via INTRAMUSCULAR

## 2021-03-21 MED ORDER — IBUPROFEN 800 MG PO TABS: 800.0000 mg | ORAL_TABLET | Freq: Three times a day (TID) | ORAL | 0 refills | Status: DC

## 2021-03-21 MED ORDER — LIDOCAINE VISCOUS HCL 2 % MT SOLN: 15.0000 mL | OROMUCOSAL | 0 refills | Status: DC | PRN

## 2021-03-21 NOTE — ED Triage Notes (Signed)
Pt presents with front side dental pain X 2 months.

## 2021-03-21 NOTE — Discharge Instructions (Addendum)
Take Augmentin twice a day for the next 7 days, this medication will help get rid of any bacteria that may be aiding to your pain  You may use ibuprofen every 8 hours for comfort, you may use 500 to 1000 mg of Tylenol in addition to increase strength of medication  You may gargle and spit lidocaine solution every 4 hours for additional comfort, this medication will give your mouth and a temporary numbing effect  Inside of your packet is a list of local dental resources as well as dental resources across the state couple please reach out to help establish care

## 2021-03-21 NOTE — ED Provider Notes (Signed)
[facility]    CSN: [id number] Arrival date & time: 03/21/21  1518      History   Chief Complaint Chief Complaint  Patient presents with   Dental Pain    HPI [name] is a 31 y.o. female.   Patient presents with dental pain to the front bottom row occurring for 2 months.  Painful to chew.  Gums have been bleeding.  Endorses that she is in need of dental work but the numbing injections are very painful.  Has attempted use of Tylenol, salt water gargles which have not been effective.  Denies fever, chills, drainage, URI symptoms.    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 Authorizing Provider  nicotine (NICODERM CQ - DOSED IN MG/24 HOURS) 21 mg/24hr patch Place 1 patch (21 mg total) onto the skin daily. 03/09/21   Lauro Franklin, MD    Family History Family History  Problem Relation Age of Onset   Diabetes Maternal Grandmother    Kidney disease Maternal Grandmother        dialysis    Social History Social History   Tobacco Use   Smoking status: Every Day    Packs/day: 1.00    Types: Cigarettes   Smokeless tobacco: Never   Tobacco  comments:    quit with positive preg  Vaping Use   Vaping Use: Never used  Substance Use Topics   Alcohol use: No    Comment: rare   Drug use: No     Allergies   Patient has no known allergies.   Review of Systems Review of Systems Defer to HPI   Physical Exam Triage Vital Signs ED Triage Vitals  Enc Vitals Group     BP 03/21/21 1528 111/76     Pulse Rate 03/21/21 1528 95     Resp 03/21/21 1528 18     Temp 03/21/21 1528 98.8 F (37.1 C)     Temp Source 03/21/21 1528 Oral     SpO2 03/21/21 1528 98 %     Weight --      Height --      Head Circumference --      Peak Flow --      Pain Score 03/21/21 1531 8     Pain Loc --  Pain Edu? --      Excl. in GC? --    No data found.  Updated Vital Signs BP 111/76 (BP Location: Right Arm)    Pulse 95    Temp 98.8 F (37.1 C) (Oral)    Resp 18    LMP 03/03/2021    SpO2 98%   Visual Acuity Right Eye Distance:   Left Eye Distance:   Bilateral Distance:    Right Eye Near:   Left Eye Near:    Bilateral Near:     Physical Exam Constitutional:      Appearance: Normal appearance.  HENT:     Mouth/Throat:     Mouth: Mucous membranes are moist.     Pharynx: Oropharynx is clear.     Comments: Dental decay and caries noted to the bottom incisors, mild to moderate gingival swelling noted, no bleeding at this time, no abscess noted Eyes:     Extraocular Movements: Extraocular movements intact.  Pulmonary:     Effort: Pulmonary effort is normal.  Skin:    General: Skin is warm and dry.  Neurological:     Mental Status: She is alert and oriented to person, place, and time. Mental status is at baseline.  Psychiatric:        Mood and Affect: Mood normal.        Behavior: Behavior normal.     UC Treatments / Results  Labs (all labs ordered are listed, but only abnormal results are displayed) Labs Reviewed - No data to display  EKG   Radiology No results found.  Procedures Procedures (including critical care  time)  Medications Ordered in UC Medications - No data to display  Initial Impression / Assessment and Plan / UC Course  I have reviewed the triage vital signs and the nursing notes.  Pertinent labs & imaging results that were available during my care of the patient were reviewed by me and considered in my medical decision making (see chart for details).  Dental pain and dental caries  Prescribed Augmentin to cover for bacteria, lidocaine and ibuprofen 800 mg prescribed for management of pain, given dental resources to establish care, encourage patient to return to the dentist for evaluation as the dentist is the only person who can manage her teeth, may follow-up with urgent care as needed Final Clinical Impressions(s) / UC Diagnoses   Final diagnoses:  None   Discharge Instructions   None    ED Prescriptions   None    PDMP not reviewed this encounter.   Valinda Hoar, [place] 03/21/21 [phone]

## 2021-08-22 ENCOUNTER — Ambulatory Visit (HOSPITAL_COMMUNITY)
Admission: EM | Admit: 2021-08-22 | Discharge: 2021-08-22 | Disposition: A | Discharge status: Home / Self Care | Payer: Medicaid Other | Attending: Emergency Medicine | Admitting: Emergency Medicine

## 2021-08-22 ENCOUNTER — Encounter (HOSPITAL_COMMUNITY): Payer: Self-pay | Admitting: Emergency Medicine

## 2021-08-22 DIAGNOSIS — N939 Abnormal uterine and vaginal bleeding, unspecified: Secondary | ICD-10-CM | POA: Diagnosis not present

## 2021-08-22 DIAGNOSIS — K029 Dental caries, unspecified: Secondary | ICD-10-CM

## 2021-08-22 DIAGNOSIS — N399 Disorder of urinary system, unspecified: Secondary | ICD-10-CM | POA: Diagnosis not present

## 2021-08-22 LAB — POC URINE PREG, ED: Preg Test, Ur: NEGATIVE

## 2021-08-22 MED ORDER — IBUPROFEN 800 MG PO TABS: 800.0000 mg | ORAL_TABLET | Freq: Three times a day (TID) | ORAL | 0 refills | Status: DC

## 2021-08-22 MED ORDER — PENICILLIN V POTASSIUM 500 MG PO TABS: 500.0000 mg | ORAL_TABLET | Freq: Four times a day (QID) | ORAL | 0 refills | Status: AC

## 2021-08-22 NOTE — ED Provider Notes (Signed)
[facility]    CSN: [id number] Arrival date & time: 08/22/21  1230      History   Chief Complaint Chief Complaint  Patient presents with   Dental Pain   Vaginal Bleeding    HPI [name] is a 31 y.o. female. Pt reports starting her period 08/20/21 and that it is much heavier than usual. Denies abdominal pain, n/v/d, dysuria, flank pain. Describes feeling bloated but denies increased gas. Is not sure if she could be pregnant, thinks she may have missed a period in June or July.   Also c/o dental pain. She reports her L lower central incisor is loose and the gum around it is swollen and painful. She is worried she has an infection.    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 Authorizing Provider  penicillin v potassium (VEETID) 500 MG tablet Take 1 tablet (500 mg total) by mouth 4 (four) times daily for 10 days. 08/22/21 09/01/21 Yes Cathlyn Parsons, NP  ibuprofen (ADVIL) 800 MG tablet Take 1 tablet (800 mg total) by mouth 3 (three) times daily. 08/22/21   Cathlyn Parsons, NP    Family History Family History  Problem Relation  Age of Onset   Diabetes Maternal Grandmother    Kidney disease Maternal Grandmother        dialysis    Social History Social History   Tobacco Use   Smoking status: Every Day    Packs/day: 1.00    Types: Cigarettes   Smokeless tobacco: Never   Tobacco comments:    quit with positive preg  Vaping Use   Vaping Use: Never used  Substance Use Topics   Alcohol use: No    Comment: rare   Drug use: No     Allergies   Patient has no known allergies.   Review of Systems Review of Systems  Genitourinary:  Positive for vaginal bleeding.     Physical Exam Triage Vital Signs ED Triage Vitals [08/22/21 1254]  Enc Vitals Group     BP 122/79     Pulse      Resp 17     Temp 98 F (36.7 C)     Temp Source Oral     SpO2 99 %  Weight      Height      Head Circumference      Peak Flow      Pain Score      Pain Loc      Pain Edu?      Excl. in GC?    No data found.  Updated Vital Signs BP 122/79 (BP Location: Left Arm)   Temp 98 F (36.7 C) (Oral)   Resp 17   LMP 08/20/2021   SpO2 99%   Visual Acuity Right Eye Distance:   Left Eye Distance:   Bilateral Distance:    Right Eye Near:   Left Eye Near:    Bilateral Near:     Physical Exam Constitutional:      General: She is not in acute distress.    Appearance: Normal appearance. She is not ill-appearing.  HENT:     Mouth/Throat:     Dentition: Abnormal dentition. Dental caries and dental abscesses present.  Pulmonary:     Effort: Pulmonary effort is normal.  Abdominal:     General: Abdomen is flat. Bowel sounds are normal. There is no distension.     Palpations: Abdomen is soft.     Tenderness: There is no abdominal tenderness. There is no guarding or rebound.  Neurological:     Mental Status: She is alert.      UC Treatments / Results  Labs (all labs ordered are listed, but only abnormal results are displayed) Labs Reviewed  POC URINE PREG, ED    EKG   Radiology No results  found.  Procedures Procedures (including critical care time)  Medications Ordered in UC Medications - No data to display  Initial Impression / Assessment and Plan / UC Course  I have reviewed the triage vital signs and the nursing notes.  Pertinent labs & imaging results that were available during my care of the patient were reviewed by me and considered in my medical decision making (see chart for details).    Pregnancy test negative. Explained periods can be heavier some months. Pt to f/u if bleeding continues to be problematic.   Pt to see a dentist to get help with her multiple decayed teeth. Rx ibuprofen and pcn.   Final Clinical Impressions(s) / UC Diagnoses   Final diagnoses:  Vaginal bleeding  Dental caries     Discharge Instructions      Contact Medicaid to ask them for a recommendation on where you can go for help with your teeth (a dentist). If they can't help you, below are some other resources.    Look up the [facility] [employer]'s [facility] for free dental clinics. https://[email or web address].williams-garcia.biz/.asp  Get there early and be prepared to wait. Caral[employer] Guile and [employer] have Armed forces operational officer schools that provide low cost routine dental care.   Other resources: [facility] [address] [place], [place] [phone]  Patients with Medicaid: [facility] [phone] [address]                                [phone] [address] [employer] Phone:  [phone]  Phone:  [phone]  Dr. Renee Rival [address] [phone]    [facility] [phone] extension [phone] [address]   [place] [phone] [address] [place].  Rescue mission [phone] extension [address], [place], [place], [address] First come first serve for the first 10 clients.  May do simple extractions only, no wisdom teeth or  surgery.  You may try the second for Thursday of the month starting at 6:30 AM.  [facility] You may call the school to see if they are still helping to provide dental care for emergent cases.  If unable to pay or uninsured, contact:  [facility] or [facility]. to become qualified for the adult dental clinic.  No matter what dental problem you have, it will not get better unless you get good dental care.  If the tooth is not taken care of, your symptoms will come back in time and you will be visiting [place] again in the Urgent Care Center with a bad toothache.  So, see your dentist as soon as possible.  If you don't have a dentist, we can give you a list of dentists.  Sometimes the most cost effective treatment is removal of the tooth.  This can be done very inexpensively through one of the low cost [employer], broadcasting/film/video such as the facility on [facility] in [place] [phone]).  The downside to this is that you will have one less tooth and this can effect your ability to chew.  Some other things that can be done for a dental infection include the following:  Rinse your mouth out with hot salt water (1/2 tsp of table salt and a pinch of baking soda in 8 oz of hot water).  You can do this every 2 or 3 hours. Avoid cold foods, beverages, and cold air.  This will make your symptoms worse. Sleep with your head elevated.  Sleeping flat will cause your gums and oral tissues to swell and make them hurt more.  You can sleep on several pillows.  Even better is to sleep in a recliner with your head higher than your heart. For mild to moderate pain, you can take Tylenol, ibuprofen, or Aleve. External application of heat by a heating pad, hot water bottle, or hot wet towel can help with pain and speed healing.  You can do this every 2 to 3 hours. Do not fall asleep on a heating pad since this can cause a burn.   Go to www.goodrx.com to look up your medications. This will  give you a list of where you can find your prescriptions at the most affordable prices. Or ask the pharmacist what the cash price is, or if they have any other discount programs available to help make your medication more affordable. This can be less expensive than what you would pay with insurance.     ED Prescriptions     Medication Sig Dispense Auth. Provider   ibuprofen (ADVIL) 800 MG tablet Take 1 tablet (800 mg total) by mouth 3 (three) times daily. 21 tablet Cathlyn Parsons, NP   penicillin v potassium (VEETID) 500 MG tablet Take 1 tablet (500 mg total) by mouth 4 (four) times daily for 10 days. 40 tablet Cathlyn Parsons, NP      PDMP not reviewed this encounter.   Cathlyn Parsons, NP 08/22/21 1354

## 2021-08-22 NOTE — ED Triage Notes (Signed)
Pt having dental pain and loose tooth causing pain for 2-3 weeks.   Pt reports Sunday started menstrual cycle and heavy and causing pain.

## 2021-08-22 NOTE — Discharge Instructions (Signed)
Contact Medicaid to ask them for a recommendation on where you can go for help with your teeth (a dentist). If they can't help you, below are some other resources.    Look up the [facility] [employer]'s Missions of [facility] for free dental clinics. [email or web address]-garcia.[email or web address]  Get there early and be prepared to wait. [employer] and [employer] have Armed forces operational officer schools that provide low cost routine dental care.   Other resources: [facility] [address] [place], [place] [phone]  Patients with Medicaid: [facility] [phone]                                [phone] [address] [employer] Phone:  [phone]                                                  Phone:  [phone]  Dr. Renee Rival [address] [phone]    [facility] [phone] extension [phone] [address]   [place] [phone] [address] [place].  Rescue mission [phone] extension [phone] [place], [place], [address] First come first serve for the first 10 clients.  May do simple extractions only, no wisdom teeth or surgery.  You may try the second for Thursday of the month starting at 6:30 AM.  [facility] You may call the school to see if they are still helping to provide dental care for emergent cases.  If unable to pay or uninsured, contact:  [facility] or [facility]. to become qualified for the adult dental clinic.  No matter what dental problem you have, it will not get better unless you get good dental care.  If the tooth is not taken care of, your symptoms will come back in time and you will be visiting [place] again in the Urgent Care Center with a bad toothache.  So, see your dentist as soon as possible.  If you don't have a dentist, we can give you a list of dentists.  Sometimes the most cost effective treatment is removal of the tooth.  This  can be done very inexpensively through one of the low cost [employer], broadcasting/film/video such as the facility on [facility] in [place] [phone]).  The downside to this is that you will have one less tooth and this can effect your ability to chew.  Some other things that can be done for a dental infection include the following:  Rinse your mouth out with hot salt water (1/2 tsp of table salt and a pinch of baking soda in 8 oz of hot water).  You can do this every 2 or 3 hours. Avoid cold foods, beverages, and cold air.  This will make your symptoms worse. Sleep with your head elevated.  Sleeping flat will cause your gums and oral tissues to swell and make them hurt more.  You can sleep on several pillows.  Even better is to sleep in a recliner with your head higher than your heart. For mild to moderate pain, you can take Tylenol, ibuprofen, or Aleve. External application of heat by a  heating pad, hot water bottle, or hot wet towel can help with pain and speed healing.  You can do this every 2 to 3 hours. Do not fall asleep on a heating pad since this can cause a burn.   Go to www.goodrx.com to look up your medications. This will give you a list of where you can find your prescriptions at the most affordable prices. Or ask the pharmacist what the cash price is, or if they have any other discount programs available to help make your medication more affordable. This can be less expensive than what you would pay with insurance.

## 2021-10-11 ENCOUNTER — Encounter (HOSPITAL_COMMUNITY): Payer: Self-pay | Admitting: Emergency Medicine

## 2021-10-11 ENCOUNTER — Ambulatory Visit (HOSPITAL_COMMUNITY)
Admission: EM | Admit: 2021-10-11 | Discharge: 2021-10-11 | Disposition: A | Discharge status: Home / Self Care | Payer: Medicaid Other | Attending: Family Medicine | Admitting: Family Medicine

## 2021-10-11 DIAGNOSIS — N926 Irregular menstruation, unspecified: Secondary | ICD-10-CM

## 2021-10-11 DIAGNOSIS — Z3202 Encounter for pregnancy test, result negative: Secondary | ICD-10-CM | POA: Diagnosis not present

## 2021-10-11 LAB — HCG, QUANTITATIVE, PREGNANCY: hCG, Beta Chain, Quant, S: <1 m[IU]/mL (ref <5)

## 2021-10-11 LAB — POC URINE PREG, ED: Preg Test, Ur: NEGATIVE

## 2021-10-11 NOTE — ED Provider Notes (Signed)
  [place]   [id number] 10/11/21 Arrival Time: 9983  ASSESSMENT & PLAN:  1. Menstrual period late    Discussed typical duration of viral illnesses. UPT negative. Serum HCG sent. Benign abdomen. OTC symptom care as needed.     Follow-up Information     [facility].   Specialty: Internal Medicine Why: As needed. Contact information: [place]  [phone]                 Reviewed expectations re: course of current medical issues. Questions answered. Outlined signs and symptoms indicating need for more acute intervention. Understanding verbalized. After Visit Summary given.   SUBJECTIVE: History from: Patient. [name] is a 31 y.o. female. Reports: late menstrual period; worried about possible pregnancy. Denies: abd pain. Normal PO intake without n/v/d.  OBJECTIVE:  Vitals:   10/11/21 1616 10/11/21 1617  BP:  (!) 147/84  Pulse:  84  Resp:  16  Temp:  97.6 F (36.4 C)  TempSrc:  Oral  SpO2: 100% 100%    General appearance: alert; no distress Lungs: speaks full sentences without difficulty; unlabored Abd: soft; NT Extremities: no edema Skin: warm and dry Neurologic: normal gait Psychological: alert and cooperative; normal mood and affect  Labs: Results for orders placed or performed during the hospital encounter of 10/11/21  POC urine pregnancy  Result Value Ref Range   Preg Test, Ur NEGATIVE NEGATIVE   Labs Reviewed  HCG, QUANTITATIVE, PREGNANCY  POC URINE PREG, ED    No Known Allergies  Past Medical History:  Diagnosis Date   Asthma    Seizures (HCC)    still has them, not on meds, last at age 53   Social History   Socioeconomic History   Marital status: Single    Spouse name: Not on file   Number of children: Not on file   Years of education: Not on file   Highest education level: Not on file  Occupational History   Not on file  Tobacco Use   Smoking status: Every  Day    Packs/day: 1.00    Types: Cigarettes   Smokeless tobacco: Never   Tobacco comments:    quit with positive preg  Vaping Use   Vaping Use: Never used  Substance and Sexual Activity   Alcohol use: No    Comment: rare   Drug use: No   Sexual activity: Yes    Birth control/protection: None  Other Topics Concern   Not on file  Social History Narrative   Not on file   Social Determinants of Health   Financial Resource Strain: Not on file  Food Insecurity: Not on file  Transportation Needs: Not on file  Physical Activity: Not on file  Stress: Not on file  Social Connections: Not on file  Intimate Partner Violence: Not on file   Family History  Problem Relation Age of Onset   Diabetes Maternal Grandmother    Kidney disease Maternal Grandmother        dialysis   Past Surgical History:  Procedure Laterality Date   NO PAST SURGERIES       Vanessa Kick, MD 10/11/21 1906

## 2021-10-11 NOTE — Discharge Instructions (Signed)
You have had labs (blood pregnancy test) drawn today. We will call you with any significant abnormalities or if there is need to begin or change treatment or pursue further follow up.  You may also review your test results online through [facility]. If you do not have a MyChart account, instructions to sign up should be on your discharge paperwork.

## 2021-10-11 NOTE — ED Triage Notes (Signed)
Pt reports a possible pregnancy. States LMP was the beginning of last month. States she also noticed a lower abdominal pain since Sunday. Denies taking home pregnancy test.

## 2022-06-26 DIAGNOSIS — F32A Depression, unspecified: Secondary | ICD-10-CM | POA: Diagnosis not present

## 2022-07-05 DIAGNOSIS — F32A Depression, unspecified: Secondary | ICD-10-CM | POA: Diagnosis not present

## 2022-07-24 DIAGNOSIS — F32A Depression, unspecified: Secondary | ICD-10-CM | POA: Diagnosis not present

## 2022-08-09 DIAGNOSIS — F32A Depression, unspecified: Secondary | ICD-10-CM | POA: Diagnosis not present

## 2022-08-14 DIAGNOSIS — F32A Depression, unspecified: Secondary | ICD-10-CM | POA: Diagnosis not present

## 2022-08-16 DIAGNOSIS — F32A Depression, unspecified: Secondary | ICD-10-CM | POA: Diagnosis not present

## 2022-08-23 ENCOUNTER — Ambulatory Visit (HOSPITAL_COMMUNITY)
Admission: EM | Admit: 2022-08-23 | Discharge: 2022-08-23 | Disposition: A | Discharge status: Home / Self Care | Payer: Medicaid Other | Attending: Family Medicine | Admitting: Family Medicine

## 2022-08-23 ENCOUNTER — Encounter (HOSPITAL_COMMUNITY): Payer: Self-pay | Admitting: *Deleted

## 2022-08-23 DIAGNOSIS — K047 Periapical abscess without sinus: Secondary | ICD-10-CM

## 2022-08-23 DIAGNOSIS — K029 Dental caries, unspecified: Secondary | ICD-10-CM

## 2022-08-23 MED ORDER — IBUPROFEN 800 MG PO TABS: 800.0000 mg | ORAL_TABLET | Freq: Three times a day (TID) | ORAL | 0 refills | Status: DC

## 2022-08-23 MED ORDER — AMOXICILLIN-POT CLAVULANATE 875-125 MG PO TABS: 1.0000 | ORAL_TABLET | Freq: Two times a day (BID) | ORAL | 0 refills | Status: AC

## 2022-08-23 MED ORDER — ACETAMINOPHEN 325 MG PO TABS
ORAL_TABLET | ORAL | Status: AC
  Filled 2022-08-23: qty 2

## 2022-08-23 MED ORDER — ACETAMINOPHEN 325 MG PO TABS
650.0000 mg | ORAL_TABLET | Freq: Once | ORAL | Status: AC
  Administered 2022-08-23: 650 mg via ORAL

## 2022-08-23 NOTE — ED Triage Notes (Signed)
Pt states she has a dental abscess on the left lower mouth X 3-4 days. She hasn't taken anything for pain but used topical numbing OTC.   She states her whole mouth hurts she has sores in it.

## 2022-08-23 NOTE — ED Provider Notes (Signed)
[facility]    CSN: [id number] Arrival date & time: 08/23/22  1222      History   Chief Complaint Chief Complaint  Patient presents with   Dental Pain    HPI [name] is a 32 y.o. female.   HPI Patient presents for evaluation for oral pain and facial pain on the left side. Patient has history of recurrent dental abscess and poor general dental health.  She is afebrile although endorses that her entire mouth is painful. She has not taken any medications for pain. She has not obvious facial swelling. Reports pain has been present for a few days.  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 Authorizing Provider  amoxicillin-clavulanate (AUGMENTIN) 875-125 MG tablet Take 1 tablet by mouth every 12 (twelve) hours for 10 days. 08/23/22 09/02/22 Yes Bing Neighbors, NP  ibuprofen (ADVIL) 800 MG tablet Take 1 tablet (800 mg total) by mouth 3 (three) times daily. 08/23/22   Bing Neighbors, NP    Family History Family History  Problem Relation Age of Onset   Diabetes Maternal Grandmother    Kidney disease Maternal Grandmother        dialysis    Social  History Social History   Tobacco Use   Smoking status: Every Day    Current packs/day: 1.00    Types: Cigarettes   Smokeless tobacco: Never   Tobacco comments:    quit with positive preg  Vaping Use   Vaping status: Never Used  Substance Use Topics   Alcohol use: No    Comment: rare   Drug use: No     Allergies   Patient has no known allergies.   Review of Systems Review of Systems Pertinent negatives listed in HPI  Physical Exam Triage Vital Signs ED Triage Vitals  Encounter Vitals Group     BP 08/23/22 1303 130/81     Systolic BP Percentile --      Diastolic BP Percentile --      Pulse Rate 08/23/22 1303 80     Resp 08/23/22 1303 18     Temp 08/23/22 1303 98.2 F (36.8 C)     Temp Source 08/23/22 1303 Oral     SpO2 08/23/22 1303 97 %     Weight --  Height --      Head Circumference --      Peak Flow --      Pain Score 08/23/22 1302 10     Pain Loc --      Pain Education --      Exclude from Growth Chart --    No data found.  Updated Vital Signs BP 130/81 (BP Location: Left Arm)   Pulse 80   Temp 98.2 F (36.8 C) (Oral)   Resp 18   LMP 08/17/2022 (Exact Date)   SpO2 97%   Visual Acuity Right Eye Distance:   Left Eye Distance:   Bilateral Distance:    Right Eye Near:   Left Eye Near:    Bilateral Near:     Physical Exam Vitals reviewed.  Constitutional:      Appearance: Normal appearance.  HENT:     Head: Normocephalic and atraumatic.     Mouth/Throat:     Mouth: Mucous membranes are moist.     Dentition: Abnormal dentition. Dental tenderness, gingival swelling and dental caries present.  Eyes:     Extraocular Movements: Extraocular movements intact.     Pupils: Pupils are equal, round, and reactive to light.  Cardiovascular:     Rate and Rhythm: Normal rate and regular rhythm.  Pulmonary:     Effort: Pulmonary effort is normal.     Breath sounds: Normal breath sounds.  Lymphadenopathy:     Cervical: Cervical adenopathy  present.  Skin:    General: Skin is warm and dry.  Neurological:     General: No focal deficit present.     Mental Status: She is alert.      UC Treatments / Results  Labs (all labs ordered are listed, but only abnormal results are displayed) Labs Reviewed - No data to display  EKG   Radiology No results found.  Procedures Procedures (including critical care time)  Medications Ordered in UC Medications  acetaminophen (TYLENOL) tablet 650 mg (650 mg Oral Given 08/23/22 1317)    Initial Impression / Assessment and Plan / UC Course  I have reviewed the triage vital signs and the nursing notes.  Pertinent labs & imaging results that were available during my care of the patient were reviewed by me and considered in my medical decision making (see chart for details).    Dental infection with caries treatment per discharge medication orders.  Ibuprofen for pain. Encourage patient to follow-up with her dental provider as she has a tooth that appears to have a cavity and needs to be removed.  Patient verbalized understanding agreement with plan. Final Clinical Impressions(s) / UC Diagnoses   Final diagnoses:  Dental caries  Dental infection   Discharge Instructions   None    ED Prescriptions     Medication Sig Dispense Auth. Provider   ibuprofen (ADVIL) 800 MG tablet Take 1 tablet (800 mg total) by mouth 3 (three) times daily. 21 tablet Bing Neighbors, NP   amoxicillin-clavulanate (AUGMENTIN) 875-125 MG tablet Take 1 tablet by mouth every 12 (twelve) hours for 10 days. 20 tablet Bing Neighbors, NP      PDMP not reviewed this encounter.   Bing Neighbors, NP 08/23/22 [phone]

## 2022-09-11 DIAGNOSIS — F32A Depression, unspecified: Secondary | ICD-10-CM | POA: Diagnosis not present

## 2022-12-04 ENCOUNTER — Encounter (HOSPITAL_COMMUNITY): Payer: Self-pay | Admitting: *Deleted

## 2022-12-04 ENCOUNTER — Ambulatory Visit (HOSPITAL_COMMUNITY)
Admission: EM | Admit: 2022-12-04 | Discharge: 2022-12-04 | Disposition: A | Discharge status: Home / Self Care | Payer: Medicaid Other | Attending: Family Medicine | Admitting: Family Medicine

## 2022-12-04 ENCOUNTER — Ambulatory Visit (HOSPITAL_COMMUNITY)
Admission: RE | Admit: 2022-12-04 | Discharge: 2022-12-04 | Disposition: A | Discharge status: Home / Self Care | Payer: Medicaid Other | Source: Ambulatory Visit | Attending: Family Medicine | Admitting: Family Medicine

## 2022-12-04 DIAGNOSIS — R109 Unspecified abdominal pain: Secondary | ICD-10-CM | POA: Insufficient documentation

## 2022-12-04 DIAGNOSIS — R1032 Left lower quadrant pain: Secondary | ICD-10-CM | POA: Insufficient documentation

## 2022-12-04 LAB — POCT URINALYSIS DIP (MANUAL ENTRY)
Bilirubin, UA: NEGATIVE
Blood, UA: NEGATIVE
Glucose, UA: NEGATIVE mg/dL
Ketones, POC UA: NEGATIVE mg/dL
Leukocytes, UA: NEGATIVE
Nitrite, UA: POSITIVE — AB
Protein Ur, POC: NEGATIVE mg/dL
Spec Grav, UA: 1.02 (ref 1.010–1.025)
Urobilinogen, UA: 0.2 U/dL
pH, UA: 5.5 (ref 5.0–8.0)

## 2022-12-04 LAB — POCT URINE PREGNANCY: Preg Test, Ur: NEGATIVE

## 2022-12-04 MED ORDER — NAPROXEN 375 MG PO TABS: 375.0000 mg | ORAL_TABLET | Freq: Two times a day (BID) | ORAL | 0 refills | Status: DC

## 2022-12-04 NOTE — ED Provider Notes (Signed)
[facility]    CSN: [id number] Arrival date & time: 12/04/22  1046      History   Chief Complaint Chief Complaint  Patient presents with   Flank Pain    HPI [name] is a 32 y.o. female.   Patient presents today for evaluation of left-sided flank pain x 1 week.  Patient reports at the onset of the pain she was experiencing a sharp pain radiating from her left flank into her lower abdomen. She reports now she is only having pain when laying on her left side.  Denies any pain radiating into her buttocks or down her hip.  Patient denies experiencing any pain like this previously.  She is not experiencing any dysuria.  Denies history of renal stones.  History reviewed. No pertinent past medical history.  There are no problems to display for this patient.   History reviewed. No pertinent surgical history.  OB History   No obstetric history on file.      Home Medications    Prior to Admission medications   Medication Sig Start Date End Date Taking? Authorizing Provider  naproxen (NAPROSYN) 375 MG tablet Take 1 tablet (375 mg total) by mouth 2 (two) times daily. 12/04/22  Yes Bing Neighbors, NP    Family History Family History  Problem Relation Age of Onset   Deep vein thrombosis Mother    Deep vein thrombosis Father     Social History Social History   Tobacco Use   Smoking status: Every Day    Types: Cigars   Smokeless tobacco: Never  Vaping Use   Vaping status: Never Used  Substance Use Topics   Alcohol use: Not Currently   Drug use: Not Currently    Types: Cocaine    Comment: last use was 3 weeks ago... 12/04/2022     Allergies   Patient has no known allergies.   Review of Systems Review of Systems  Genitourinary:  Positive for flank pain.     Physical Exam Triage Vital Signs ED Triage Vitals  Encounter Vitals Group     BP 12/04/22 1131 (!) 130/99     Systolic BP Percentile --      Diastolic BP Percentile --       Pulse Rate 12/04/22 1131 77     Resp 12/04/22 1131 16     Temp 12/04/22 1131 98.3 F (36.8 C)     Temp Source 12/04/22 1131 Oral     SpO2 12/04/22 1131 99 %     Weight --      Height --      Head Circumference --      Peak Flow --      Pain Score 12/04/22 1128 0     Pain Loc --      Pain Education --      Exclude from Growth Chart --    No data found.  Updated Vital Signs BP (!) 130/99 (BP Location: Left Arm)   Pulse 77   Temp 98.3 F (36.8 C) (Oral)   Resp 16   LMP 10/23/2022 (Approximate)   SpO2 99%   Visual Acuity Right Eye Distance:   Left Eye Distance:   Bilateral Distance:    Right Eye Near:   Left Eye Near:    Bilateral Near:     Physical Exam Constitutional:      Appearance: Normal appearance.  HENT:     Head: Normocephalic and atraumatic.     Nose: Nose normal.  Eyes:     Extraocular Movements: Extraocular movements intact.     Pupils: Pupils are equal, round, and reactive to light.  Cardiovascular:     Rate and Rhythm: Normal rate and regular rhythm.  Pulmonary:     Effort: Pulmonary effort is normal.     Breath sounds: Normal breath sounds.  Abdominal:     Tenderness: There is no right CVA tenderness or left CVA tenderness.  Musculoskeletal:        General: Normal range of motion.     Cervical back: Normal range of motion.  Neurological:     General: No focal deficit present.     Mental Status: She is alert and oriented to person, place, and time.      UC Treatments / Results  Labs (all labs ordered are listed, but only abnormal results are displayed) Labs Reviewed  POCT URINALYSIS DIP (MANUAL ENTRY) - Abnormal; Notable for the following components:      Result Value   Color, UA straw (*)    Clarity, UA cloudy (*)    Nitrite, UA Positive (*)    All other components within normal limits  URINE CULTURE  POCT URINE PREGNANCY    EKG   Radiology No results found.  Procedures Procedures (including critical care  time)  Medications Ordered in UC Medications - No data to display  Initial Impression / Assessment and Plan / UC Course  I have reviewed the triage vital signs and the nursing notes.  Pertinent labs & imaging results that were available during my care of the patient were reviewed by me and considered in my medical decision making (see chart for details).    Left flank pain, pain not reproducible on exam.  Retained a UA which showed cloudy straw-colored urine with positive nitrates negative leukocytes.  Given patient is a poor historian could obtain a urine culture to rule out any evolving infection.  Abdominal one-view ordered to rule out renal stone.  Patient advised to go to outpatient imaging the imaging  machine is down on site today.  Patient advised that this writer will follow up with her if imaging results are abnormal.  Treating symptoms as a muscle strain trialing naproxen 375 mg twice daily as needed for pain. Return for evaluation if symptoms worsen or do not improve.  Go to the emergency department if symptoms become severe.  . Final Clinical Impressions(s) / UC Diagnoses   Final diagnoses:  Left flank pain     Discharge Instructions      Imaging machine is found today.  You can go to [facility] Radiology Department to have your x-ray completed today.  After through the main entrance and ask for directions to the radiology department.  I will update you through MyChart with your results if a change in treatment is warranted      ED Prescriptions     Medication Sig Dispense Auth. Provider   naproxen (NAPROSYN) 375 MG tablet Take 1 tablet (375 mg total) by mouth 2 (two) times daily. 20 tablet Bing Neighbors, NP      PDMP not reviewed this encounter.   Bing Neighbors, NP 12/04/22 1339

## 2022-12-04 NOTE — ED Triage Notes (Signed)
Pt states she is having left sided flank pain that radiates to her abd X 1 week.  She hasn't taken any meds for the pain.   She is having no pain today states only hurts when she is laying down.

## 2022-12-04 NOTE — ED Notes (Signed)
[facility] and if abnormal someone will call with results otherwise they will be on her mychart. Pt and her mother verbalized understanding

## 2022-12-04 NOTE — Discharge Instructions (Addendum)
Imaging machine is found today.  You can go to [facility] Radiology Department to have your x-ray completed today.  After through the main entrance and ask for directions to the radiology department.  I will update you through MyChart with your results if a change in treatment is warranted

## 2022-12-06 ENCOUNTER — Telehealth: Payer: Self-pay

## 2022-12-06 ENCOUNTER — Telehealth (HOSPITAL_COMMUNITY): Payer: Self-pay | Admitting: *Deleted

## 2022-12-06 LAB — URINE CULTURE
Culture: >=100000 — AB
Special Requests: NORMAL

## 2022-12-06 MED ORDER — CEPHALEXIN 500 MG PO CAPS: 500.0000 mg | ORAL_CAPSULE | Freq: Two times a day (BID) | ORAL | 0 refills | Status: AC

## 2022-12-06 NOTE — Telephone Encounter (Signed)
Created in error

## 2022-12-06 NOTE — Telephone Encounter (Signed)
Pt appears to be constipated. Please notify to purchase OTC miralax and use as directed. If symptoms persist or worsen, go the ED.    Per xray result advised above. Pt verbalized understanding

## 2022-12-06 NOTE — Telephone Encounter (Signed)
Per K. Harris, NP, "Keflex 500 mg BID for 7 days has history of pyelonephritis. if she is having any worsening of symptoms such as chills, fever, vomiting, or abdominal pain I would like for her to go to the ED."  Reviewed with patient, verified pharmacy, prescription sent.

## 2023-02-05 ENCOUNTER — Encounter (HOSPITAL_COMMUNITY): Payer: Self-pay | Admitting: Emergency Medicine

## 2023-02-05 ENCOUNTER — Ambulatory Visit (HOSPITAL_COMMUNITY)
Admission: EM | Admit: 2023-02-05 | Discharge: 2023-02-05 | Disposition: A | Discharge status: Home / Self Care | Payer: Medicaid Other | Attending: Physician Assistant | Admitting: Physician Assistant

## 2023-02-05 ENCOUNTER — Ambulatory Visit (INDEPENDENT_AMBULATORY_CARE_PROVIDER_SITE_OTHER): Payer: Medicaid Other

## 2023-02-05 DIAGNOSIS — M25552 Pain in left hip: Secondary | ICD-10-CM

## 2023-02-05 MED ORDER — NAPROXEN 500 MG PO TABS: 500.0000 mg | ORAL_TABLET | Freq: Two times a day (BID) | ORAL | 2 refills | Status: AC
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
## 2023-02-05 NOTE — Discharge Instructions (Addendum)
Follow up with the Orthopaedist for evaluation  

## 2023-11-27 ENCOUNTER — Other Ambulatory Visit: Payer: Self-pay

## 2023-11-27 ENCOUNTER — Emergency Department (HOSPITAL_COMMUNITY): Admission: EM | Admit: 2023-11-27 | Discharge: 2023-11-27 | Disposition: A | Discharge status: Home / Self Care

## 2023-11-27 DIAGNOSIS — S30814A Abrasion of vagina and vulva, initial encounter: Secondary | ICD-10-CM | POA: Diagnosis not present

## 2023-11-27 DIAGNOSIS — T148XXA Other injury of unspecified body region, initial encounter: Secondary | ICD-10-CM

## 2023-11-27 DIAGNOSIS — X58XXXA Exposure to other specified factors, initial encounter: Secondary | ICD-10-CM | POA: Insufficient documentation

## 2023-11-27 DIAGNOSIS — J45909 Unspecified asthma, uncomplicated: Secondary | ICD-10-CM | POA: Insufficient documentation

## 2023-11-27 MED ORDER — ACETAMINOPHEN 500 MG PO TABS
1000.0000 mg | ORAL_TABLET | Freq: Once | ORAL | Status: AC
  Administered 2023-11-27: 1000 mg via ORAL
  Filled 2023-11-27: qty 2

## 2023-11-27 NOTE — ED Provider Notes (Signed)
 [place] EMERGENCY DEPARTMENT AT [facility] Provider Note   CSN: [id number] Arrival date & time: 11/27/23  9081     Patient presents with: Abscess   [name] is a 33 y.o. female presents with pain near her genital area x 2 days.  She is concerned there may be a boil there.  Not associate with any dysuria, increased urinary frequency, vaginal bleeding or discharge.  Denies any abdominal pain.  No fevers.    Abscess     Past Medical History:  Diagnosis Date   Asthma    Seizures (HCC)    still has them, not on meds, last at age 8     Prior to Admission medications   Medication Sig Start Date End Date Taking? Authorizing Provider  naproxen  (NAPROSYN ) 500 MG tablet Take 1 tablet (500 mg total) by mouth 2 (two) times daily with a meal. 02/05/23 02/05/24  Flint Sonny POUR, PA-C    Allergies: Patient has no known allergies.    Review of Systems  All other systems reviewed and are negative.   Updated Vital Signs BP (!) 141/108 (BP Location: Left Arm)   Pulse 78   Temp 98.6 F (37 C) (Oral)   Resp (!) 22   LMP 10/26/2023   SpO2 100%   Physical Exam Vitals and nursing note reviewed. Exam conducted with a chaperone present.  Constitutional:      General: She is not in acute distress.    Appearance: She is well-developed.  HENT:     Head: Normocephalic and atraumatic.  Eyes:     Conjunctiva/sclera: Conjunctivae normal.  Cardiovascular:     Rate and Rhythm: Normal rate and regular rhythm.     Heart sounds: No murmur heard. Pulmonary:     Effort: Pulmonary effort is normal. No respiratory distress.     Breath sounds: Normal breath sounds.  Abdominal:     Palpations: Abdomen is soft.     Tenderness: There is no abdominal tenderness.  Genitourinary:    Comments: There is a superficial excoriation at the 5 o'clock position of the labia majora with some associated tenderness.  There is no induration, fluctuance or drainage.  No erythema or  warmth Musculoskeletal:        General: No swelling.     Cervical back: Neck supple.  Skin:    General: Skin is warm and dry.     Capillary Refill: Capillary refill takes less than 2 seconds.  Neurological:     Mental Status: She is alert.  Psychiatric:        Mood and Affect: Mood normal.     (all labs ordered are listed, but only abnormal results are displayed) Labs Reviewed - No data to display  EKG: None  Radiology: No results found.   Procedures   Medications Ordered in the ED  acetaminophen  (TYLENOL ) tablet 1,000 mg (has no administration in time range)    Clinical Course as of 11/27/23 1236  Wed Nov 27, 2023  1231 Patient evaluated for concern for possible vaginal boil x 2 days.  Upon arrival she is hemodynamically stable.  On exam she has an excoriation without any evidence of an abscess.  No antibiotics, I&D and only imaging indicated today.  Patient will be discharged home.  Will provide PCP follow-up. [JT]    Clinical Course User Index [JT] Donnajean Lynwood DEL, PA-C  Medical Decision Making  This patient presents to the ED with chief complaint(s) of vaginal pain.  The complaint involves an extensive differential diagnosis and also carries with it a high risk of complications and morbidity.   Pertinent past medical history as listed in HPI  The differential diagnosis includes  Patient is without any dysuria or increased frequency.  No vaginal bleeding or discharge.  Do not suspect UTI, STI.  Exam is not consistent with a Bartholin abscess. Additional history obtained: Records reviewed Care Everywhere/External Records  Disposition:   Patient will be discharged home. The patient has been appropriately medically screened and/or stabilized in the ED. I have low suspicion for any other emergent medical condition which would require further screening, evaluation or treatment in the ED or require inpatient management. At time of  discharge the patient is hemodynamically stable and in no acute distress. I have discussed work-up results and diagnosis with patient and answered all questions. Patient is agreeable with discharge plan. We discussed strict return precautions for returning to the emergency department and they verbalized understanding.     Social Determinants of Health:   Patient's impaired access to primary care  increases the complexity of managing their presentation  This note was dictated with voice recognition software.  Despite best efforts at proofreading, errors may have occurred which can change the documentation meaning.       Final diagnoses:  Excoriation    ED Discharge Orders     None          Donnajean Lynwood DEL, PA-C 11/27/23 1236    Neysa Caron PARAS, DO 11/27/23 1607

## 2023-11-27 NOTE — Discharge Instructions (Addendum)
 You were evaluated in the emergency room for vaginal pain.  Your exam was consistent with an excoriation/scratch.  This is expected to resolve.  Please clean the area daily with soapy water.  You may take warm baths for relief.  If you experience any worsening symptoms please return emergency room.  Otherwise please call the number listed to follow-up with the primary care doctor.

## 2023-11-27 NOTE — ED Notes (Addendum)
 Discharge instructions reviewed with pt. Pt states she has no questions but is very dissatisfied with care, stating that she never should have come here. This RN implemented supportive listening. Work note provided for patient. Pt ambulatory upon discharge.

## 2023-11-27 NOTE — ED Triage Notes (Signed)
 Pt. Stated, I have a rash and a boil close to were I pee for 2 days

## 2023-11-27 NOTE — ED Triage Notes (Signed)
 Complains of abscess to genitals x 2 days.

## 2023-11-27 NOTE — ED Notes (Signed)
 ED Provider at bedside.

## 2023-12-13 ENCOUNTER — Emergency Department (HOSPITAL_COMMUNITY)
Admission: EM | Admit: 2023-12-13 | Discharge: 2023-12-13 | Disposition: A | Discharge status: Home / Self Care | Attending: Emergency Medicine | Admitting: Emergency Medicine

## 2023-12-13 ENCOUNTER — Encounter (HOSPITAL_COMMUNITY): Payer: Self-pay

## 2023-12-13 DIAGNOSIS — K0889 Other specified disorders of teeth and supporting structures: Secondary | ICD-10-CM | POA: Diagnosis present

## 2023-12-13 DIAGNOSIS — K029 Dental caries, unspecified: Secondary | ICD-10-CM | POA: Diagnosis not present

## 2023-12-13 DIAGNOSIS — K047 Periapical abscess without sinus: Secondary | ICD-10-CM | POA: Diagnosis not present

## 2023-12-13 DIAGNOSIS — K0381 Cracked tooth: Secondary | ICD-10-CM | POA: Insufficient documentation

## 2023-12-13 MED ORDER — AMOXICILLIN-POT CLAVULANATE 875-125 MG PO TABS: 1.0000 | ORAL_TABLET | Freq: Two times a day (BID) | ORAL | 0 refills | Status: AC

## 2023-12-13 MED ORDER — AMOXICILLIN-POT CLAVULANATE 875-125 MG PO TABS
1.0000 | ORAL_TABLET | Freq: Once | ORAL | Status: AC
  Administered 2023-12-13: 1 via ORAL
  Filled 2023-12-13: qty 1

## 2023-12-13 MED ORDER — KETOROLAC TROMETHAMINE 15 MG/ML IJ SOLN
15.0000 mg | Freq: Once | INTRAMUSCULAR | Status: AC
  Administered 2023-12-13: 15 mg via INTRAMUSCULAR
  Filled 2023-12-13: qty 1

## 2023-12-13 NOTE — ED Notes (Signed)
 Pt verbalized understanding of discharge instructions. Opportunity for questions provided.

## 2023-12-13 NOTE — ED Triage Notes (Signed)
 Pt c/o dental pain from broken wisdom tooth on L lower x1-2 months.  Pain score 10/10.  Pt reports using OTC medication w/o relief.

## 2023-12-13 NOTE — Discharge Instructions (Addendum)
 Today you were seen for dental pain.  Please alternate taking 1000 mg Tylenol  and 800 mg Motrin  every 4 hours.  You have been prescribed Augmentin  for possible dental infection.  Please follow-up with a dentist for further evaluation and workup.  Thank you for letting us  treat you today. After performing a physical exam, I feel you are safe to go home. Please follow up with your PCP in the next several days and provide them with your records from this visit. Return to the Emergency Room if pain becomes severe or symptoms worsen.

## 2023-12-13 NOTE — ED Provider Notes (Signed)
 [place] EMERGENCY DEPARTMENT AT [facility] Provider Note   CSN: [id number] Arrival date & time: 12/13/23  9061     Patient presents with: Dental Problem   [name] is a 33 y.o. female presents today for dental pain for 1 to 2 months.  Patient reports broken wisdom tooth and left lower mouth.  Patient denies fever, chills, nausea, vomiting, or any other complaints.    HPI     Prior to Admission medications   Medication Sig Start Date End Date Taking? Authorizing Provider  amoxicillin -clavulanate (AUGMENTIN ) 875-125 MG tablet Take 1 tablet by mouth every 12 (twelve) hours. 12/13/23  Yes Kavion Mancinas N, PA-C  naproxen  (NAPROSYN ) 500 MG tablet Take 1 tablet (500 mg total) by mouth 2 (two) times daily with a meal. 02/05/23 02/05/24  Flint Sonny POUR, PA-C    Allergies: Patient has no known allergies.    Review of Systems  HENT:  Positive for dental problem.     Updated Vital Signs BP 131/86 (BP Location: Right Arm)   Pulse (!) 110   Temp 98.2 F (36.8 C) (Oral)   Resp 18   Ht 5' 6 (1.676 m)   Wt 51.3 kg   LMP 10/26/2023   SpO2 99%   BMI 18.24 kg/m   Physical Exam Vitals and nursing note reviewed.  Constitutional:      General: She is not in acute distress.    Appearance: She is well-developed. She is not toxic-appearing.  HENT:     Head: Normocephalic and atraumatic.     Right Ear: External ear normal.     Left Ear: External ear normal.     Mouth/Throat:     Dentition: Abnormal dentition. Gingival swelling and dental caries present. No dental abscesses.     Tongue: Tongue does not deviate from midline.     Pharynx: Oropharynx is clear. Uvula midline. No posterior oropharyngeal erythema or uvula swelling.     Tonsils: No tonsillar exudate or tonsillar abscesses.      Comments: No mandibular or sublingual swelling.  Patient appears to have infection of the tooth noted above without any fluctuant abscess or obvious discharge.  There is  erythema and mild gingival swelling. Eyes:     Conjunctiva/sclera: Conjunctivae normal.  Cardiovascular:     Rate and Rhythm: Normal rate and regular rhythm.     Heart sounds: No murmur heard. Pulmonary:     Effort: Pulmonary effort is normal. No respiratory distress.     Breath sounds: Normal breath sounds.  Abdominal:     Palpations: Abdomen is soft.     Tenderness: There is no abdominal tenderness.  Musculoskeletal:        General: No swelling.     Cervical back: Neck supple.  Skin:    General: Skin is warm and dry.     Capillary Refill: Capillary refill takes less than 2 seconds.  Neurological:     Mental Status: She is alert.  Psychiatric:        Mood and Affect: Mood normal.     (all labs ordered are listed, but only abnormal results are displayed) Labs Reviewed - No data to display  EKG: None  Radiology: No results found.   Procedures   Medications Ordered in the ED  amoxicillin -clavulanate (AUGMENTIN ) 875-125 MG per tablet 1 tablet (has no administration in time range)  ketorolac  (TORADOL ) 15 MG/ML injection 15 mg (has no administration in time range)  Medical Decision Making  This patient presents to the ED for concern of dental pain differential diagnosis includes dental abscess, dental infection, fractured tooth, Ludwig's angina, PTA   Medicines ordered and prescription drug management: I ordered medication including Augmentin  and toradol     I have reviewed the patients home medicines and have made adjustments as needed   Problem List / ED Course: Considered for admission or further workup however patient's vital signs physical exam are reassuring.  Patient symptoms likely due to acute dental infection.  Patient started on Augmentin  and advised to alternate Tylenol  and Motrin  as needed for pain.  Patient given dental resource guide.  I feel patient is safe for discharge at this time.     Final diagnoses:  Pain,  dental    ED Discharge Orders          Ordered    amoxicillin -clavulanate (AUGMENTIN ) 875-125 MG tablet  Every 12 hours        12/13/23 1440               Francis Ileana SAILOR, PA-C 12/13/23 1443    Tegeler, Lonni PARAS, MD 12/13/23 1710

## 2024-01-04 ENCOUNTER — Encounter (HOSPITAL_COMMUNITY): Payer: Self-pay | Admitting: *Deleted

## 2024-01-04 ENCOUNTER — Ambulatory Visit (HOSPITAL_COMMUNITY)
Admission: EM | Admit: 2024-01-04 | Discharge: 2024-01-04 | Disposition: A | Discharge status: Home / Self Care | Attending: Emergency Medicine | Admitting: Emergency Medicine

## 2024-01-04 DIAGNOSIS — H00024 Hordeolum internum left upper eyelid: Secondary | ICD-10-CM

## 2024-01-04 MED ORDER — OLOPATADINE HCL 0.2 % OP SOLN: 1.0000 [drp] | Freq: Every day | OPHTHALMIC | 1 refills | Status: AC

## 2024-01-04 NOTE — ED Provider Notes (Signed)
 [facility]    CSN: [id number] Arrival date & time: 01/04/24  0827    HISTORY   Chief Complaint  Patient presents with   Eye Pain   HPI [name] is a pleasant, 33 y.o. female who presents to urgent care today. Patient complains of acute onset of left eye pain last night.  Patient states she woke up and her pain is not worse.  Denies vision changes, drainage from her eyes.  Patient states she does not wear contact lenses.   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 Authorizing Provider  amoxicillin -clavulanate (AUGMENTIN ) 875-125 MG tablet Take 1 tablet by mouth every 12 (twelve) hours. 12/13/23   Keith, Kayla N, PA-C  naproxen  (NAPROSYN ) 500 MG tablet Take 1 tablet (500 mg total) by mouth 2 (two) times daily with a meal. 02/05/23 02/05/24  Flint Sonny POUR, PA-C    Family History Family History  Problem Relation Age of Onset   Deep vein thrombosis Mother    Deep vein thrombosis Father    Diabetes Maternal Grandmother    Kidney disease Maternal Grandmother        dialysis   Social History Social History[1] Allergies   Patient has no  known allergies.  Review of Systems Review of Systems  Eyes:  Positive for pain.   Pertinent findings revealed after performing a 14 point review of systems has been noted in the history of present illness.  Physical Exam Vital Signs BP 99/61   Pulse 81   Temp 98.5 F (36.9 C) (Oral)   Resp 18   LMP  (LMP Unknown) Comment: LMP approx 1 month ago; states not sexually active x years  SpO2 95%   No data found.  Physical Exam Vitals and nursing note reviewed.  Constitutional:      General: She is awake. She is not in acute distress.    Appearance: Normal appearance. She is well-developed and well-groomed. She is not ill-appearing.  HENT:     Head: Normocephalic and atraumatic.     Salivary Glands: Right salivary gland is not diffusely enlarged or tender. Left salivary gland is not diffusely enlarged or tender.     Right Ear: Hearing, tympanic membrane, ear canal and external ear normal.     Left Ear: Hearing, tympanic membrane, ear canal and external ear normal.  Nose: Nose normal.     Right Turbinates: Not enlarged, swollen or pale.     Left Turbinates: Not enlarged, swollen or pale.     Right Sinus: No maxillary sinus tenderness or frontal sinus tenderness.     Left Sinus: No maxillary sinus tenderness or frontal sinus tenderness.     Mouth/Throat:     Lips: Pink. No lesions.     Mouth: Mucous membranes are moist. No oral lesions.     Tongue: No lesions. Tongue does not deviate from midline.     Palate: No mass and lesions.     Pharynx: Oropharynx is clear. Uvula midline. No pharyngeal swelling, oropharyngeal exudate, posterior oropharyngeal erythema, uvula swelling or postnasal drip.     Tonsils: No tonsillar exudate. 0 on the right. 0 on the left.  Eyes:     General: Lids are normal.        Right eye: No discharge.        Left eye: Hordeolum (Medial aspect of left upper eyelid without surrounding edema, left upper eyelid is mildly erythematous) present.No discharge.      Conjunctiva/sclera: Conjunctivae normal.     Right eye: Right conjunctiva is not injected.     Left eye: Left conjunctiva is not injected.  Neck:     Trachea: Trachea and phonation normal.  Cardiovascular:     Rate and Rhythm: Normal rate and regular rhythm.  Pulmonary:     Effort: Pulmonary effort is normal.     Breath sounds: Normal breath sounds.  Chest:     Chest wall: No tenderness.  Musculoskeletal:        General: Normal range of motion.     Cervical back: Full passive range of motion without pain, normal range of motion and neck supple. Normal range of motion.  Lymphadenopathy:     Cervical: No cervical adenopathy.  Skin:    General: Skin is warm and dry.     Findings: No erythema or rash.  Neurological:     General: No focal deficit present.     Mental Status: She is alert and oriented to person, place, and time. Mental status is at baseline.  Psychiatric:        Attention and Perception: Attention and perception normal.        Mood and Affect: Mood and affect normal.        Speech: Speech normal.        Behavior: Behavior normal. Behavior is cooperative.        Thought Content: Thought content normal.     Visual Acuity Right Eye Distance: 20/40 -1 Left Eye Distance: 20/40 Bilateral Distance: 20/30 -1  Right Eye Near:   Left Eye Near:    Bilateral Near:     UC Couse / Diagnostics / Procedures:     Radiology No results found.  Procedures Procedures (including critical care time) EKG  Pending results:  Labs Reviewed - No data to display  Medications Ordered in UC: Medications - No data to display  UC Diagnoses / Final Clinical Impressions(s)   I have reviewed the triage vital signs and the nursing notes.  Pertinent labs & imaging results that were available during my care of the patient were reviewed by me and considered in my medical decision making (see chart for details).    Final diagnoses:  Hordeolum internum left upper eyelid   Visual  acuity on arrival is normal.  Physical exam findings concerning for internal hordeolum of left medial aspect  of left upper eyelid.  Patient advised to apply warm compresses for 20 minutes 4 times daily until symptoms resolve.  Patient provided with educational handout in her AVS.  Patient also provided with Pataday  eyedrops for relief of itching.  Conservative care recommended.  Return precautions advised.  Please see discharge instructions below for details of plan of care as provided to patient. ED Prescriptions     Medication Sig Dispense Auth. Provider   Olopatadine  HCl (PATADAY ) 0.2 % SOLN Apply 1 drop to eye daily. 2.5 mL Joesph Shaver Scales, PA-C      PDMP not reviewed this encounter.    Discharge Instructions      I have enclosed some information about managing styes at home.  The most important thing that you need to do was apply warm compress to your left eye for 20 minutes 4 times daily every day until the redness and swelling has resolved.  For the itchiness in your left eye, I provided you with a prescription for a medication called Pataday  eyedrops.  You could have instill 1 drop in your eye every day as needed.  Thank you for visiting [facility] today.  We appreciate the opportunity to participate in your care.      Disposition Upon Discharge:  Condition: stable for discharge home  Patient presented with an acute illness with associated systemic symptoms and significant discomfort requiring urgent management. In my opinion, this is a condition that a prudent lay person (someone who possesses an average knowledge of health and medicine) may potentially expect to result in complications if not addressed urgently such as respiratory distress, impairment of bodily function or dysfunction of bodily organs.   Routine symptom specific, illness specific and/or disease specific instructions were discussed with the patient and/or caregiver at length.   As such,  the patient has been evaluated and assessed, work-up was performed and treatment was provided in alignment with urgent care protocols and evidence based medicine.  Patient/parent/caregiver has been advised that the patient may require follow up for further testing and treatment if the symptoms continue in spite of treatment, as clinically indicated and appropriate.  Patient/parent/caregiver has been advised to return to the [facility] or PCP if no better; to PCP or the Emergency Department if new signs and symptoms develop, or if the current signs or symptoms continue to change or worsen for further workup, evaluation and treatment as clinically indicated and appropriate  The patient will follow up with their current PCP if and as advised. If the patient does not currently have a PCP we will assist them in obtaining one.   The patient may need specialty follow up if the symptoms continue, in spite of conservative treatment and management, for further workup, evaluation, consultation and treatment as clinically indicated and appropriate.  Patient/parent/caregiver verbalized understanding and agreement of plan as discussed.  All questions were addressed during visit.  Please see discharge instructions below for further details of plan.  This office note has been dictated using Teaching laboratory technician.  Unfortunately, this method of dictation can sometimes lead to typographical or grammatical errors.  I apologize for your inconvenience in advance if this occurs.  Please do not hesitate to reach out to me if clarification is needed.       [1]  Social History Tobacco Use   Smoking status: Every Day    Types: Cigars    Passive exposure: Never   Smokeless tobacco: Never   Tobacco comments:  quit with positive preg  Vaping Use   Vaping status: Some Days  Substance Use Topics   Alcohol use: Not Currently    Comment: just stopped recently   Drug use: Yes    Types: Cocaine, Marijuana     Comment: Last cocaine use approx 10/2023; current marijuana use     Joesph Shaver Scales, [place] 01/04/24 [phone]

## 2024-01-04 NOTE — ED Triage Notes (Signed)
 C/O starting with left eye pain last night. States woke up and pain is worse. Denies any vision changes. Denies any eye drainage. Does not wear contact lenses.

## 2024-01-04 NOTE — Discharge Instructions (Addendum)
 I have enclosed some information about managing styes at home.  The most important thing that you need to do was apply warm compress to your left eye for 20 minutes 4 times daily every day until the redness and swelling has resolved.  For the itchiness in your left eye, I provided you with a prescription for a medication called Pataday  eyedrops.  You could have instill 1 drop in your eye every day as needed.  Thank you for visiting [facility] today.  We appreciate the opportunity to participate in your care.
